# Patient Record
Sex: Male | Born: 1955 | Hispanic: Yes | Smoking: Never smoker
Health system: Southern US, Community
[De-identification: ages and names within clinical notes are randomized; demographics above are authoritative.]

## PROBLEM LIST (undated history)

## (undated) DIAGNOSIS — K5790 Diverticulosis of intestine, part unspecified, without perforation or abscess without bleeding: Secondary | ICD-10-CM

## (undated) DIAGNOSIS — A6000 Herpesviral infection of urogenital system, unspecified: Secondary | ICD-10-CM

## (undated) DIAGNOSIS — B20 Human immunodeficiency virus [HIV] disease: Secondary | ICD-10-CM

## (undated) DIAGNOSIS — K219 Gastro-esophageal reflux disease without esophagitis: Secondary | ICD-10-CM

## (undated) DIAGNOSIS — Z9889 Other specified postprocedural states: Secondary | ICD-10-CM

## (undated) HISTORY — DX: Human immunodeficiency virus (HIV) disease: B20

## (undated) HISTORY — DX: Diverticulosis of intestine, part unspecified, without perforation or abscess without bleeding: K57.90

## (undated) HISTORY — DX: Herpesviral infection of urogenital system, unspecified: A60.00

## (undated) HISTORY — PX: BACK SURGERY: SHX140

## (undated) HISTORY — DX: Gastro-esophageal reflux disease without esophagitis: K21.9

## (undated) HISTORY — DX: Other specified postprocedural states: Z98.890

---

## 2001-05-20 ENCOUNTER — Encounter: Payer: Self-pay | Admitting: Urology

## 2001-05-20 ENCOUNTER — Ambulatory Visit (HOSPITAL_COMMUNITY): Admission: RE | Admit: 2001-05-20 | Discharge: 2001-05-20 | Payer: Self-pay | Admitting: Urology

## 2001-06-05 ENCOUNTER — Other Ambulatory Visit: Admission: RE | Admit: 2001-06-05 | Discharge: 2001-06-05 | Payer: Self-pay | Admitting: Urology

## 2001-07-20 ENCOUNTER — Encounter: Payer: Self-pay | Admitting: *Deleted

## 2001-07-20 ENCOUNTER — Emergency Department (HOSPITAL_COMMUNITY): Admission: EM | Admit: 2001-07-20 | Discharge: 2001-07-20 | Payer: Self-pay | Admitting: *Deleted

## 2002-09-19 ENCOUNTER — Encounter: Payer: Self-pay | Admitting: *Deleted

## 2002-09-19 ENCOUNTER — Emergency Department (HOSPITAL_COMMUNITY): Admission: EM | Admit: 2002-09-19 | Discharge: 2002-09-19 | Payer: Self-pay | Admitting: *Deleted

## 2004-02-13 ENCOUNTER — Emergency Department (HOSPITAL_COMMUNITY): Admission: EM | Admit: 2004-02-13 | Discharge: 2004-02-13 | Payer: Self-pay | Admitting: Emergency Medicine

## 2005-05-10 ENCOUNTER — Ambulatory Visit (HOSPITAL_COMMUNITY): Admission: RE | Admit: 2005-05-10 | Discharge: 2005-05-10 | Payer: Self-pay | Admitting: Family Medicine

## 2006-02-09 ENCOUNTER — Emergency Department (HOSPITAL_COMMUNITY): Admission: EM | Admit: 2006-02-09 | Discharge: 2006-02-09 | Payer: Self-pay | Admitting: Emergency Medicine

## 2006-06-21 ENCOUNTER — Ambulatory Visit: Payer: Self-pay | Admitting: Internal Medicine

## 2006-07-02 ENCOUNTER — Ambulatory Visit (HOSPITAL_COMMUNITY): Admission: RE | Admit: 2006-07-02 | Discharge: 2006-07-02 | Payer: Self-pay | Admitting: Internal Medicine

## 2006-07-02 ENCOUNTER — Ambulatory Visit: Payer: Self-pay | Admitting: Internal Medicine

## 2006-07-02 DIAGNOSIS — Z9889 Other specified postprocedural states: Secondary | ICD-10-CM

## 2006-07-02 HISTORY — DX: Other specified postprocedural states: Z98.890

## 2006-07-03 HISTORY — PX: ESOPHAGOGASTRODUODENOSCOPY: SHX1529

## 2006-07-03 HISTORY — PX: COLONOSCOPY: SHX174

## 2006-07-30 ENCOUNTER — Ambulatory Visit: Payer: Self-pay | Admitting: Internal Medicine

## 2006-08-29 ENCOUNTER — Encounter: Admission: RE | Admit: 2006-08-29 | Discharge: 2006-08-29 | Payer: Self-pay | Admitting: Internal Medicine

## 2006-08-29 ENCOUNTER — Ambulatory Visit: Payer: Self-pay | Admitting: Internal Medicine

## 2006-08-29 ENCOUNTER — Encounter (INDEPENDENT_AMBULATORY_CARE_PROVIDER_SITE_OTHER): Payer: Self-pay | Admitting: *Deleted

## 2006-08-29 LAB — CONVERTED CEMR LAB
CD4 Count: 180 microliters
CD4 T Cell Abs: 180

## 2006-09-18 ENCOUNTER — Ambulatory Visit: Payer: Self-pay | Admitting: Internal Medicine

## 2006-10-21 ENCOUNTER — Encounter (INDEPENDENT_AMBULATORY_CARE_PROVIDER_SITE_OTHER): Payer: Self-pay | Admitting: *Deleted

## 2006-10-21 ENCOUNTER — Encounter: Admission: RE | Admit: 2006-10-21 | Discharge: 2006-10-21 | Payer: Self-pay | Admitting: Internal Medicine

## 2006-10-21 ENCOUNTER — Ambulatory Visit: Payer: Self-pay | Admitting: Internal Medicine

## 2006-10-21 LAB — CONVERTED CEMR LAB
CD4 Count: 350 microliters
HIV 1 RNA Quant: 289 copies/mL

## 2006-11-08 ENCOUNTER — Ambulatory Visit: Payer: Self-pay | Admitting: Internal Medicine

## 2006-11-08 LAB — CONVERTED CEMR LAB
Helicobacter Pylori Antibody-IgG: 0.4
Testosterone: 379.13 ng/dL (ref 350–890)

## 2006-12-03 ENCOUNTER — Ambulatory Visit: Payer: Self-pay | Admitting: Internal Medicine

## 2006-12-30 ENCOUNTER — Encounter (INDEPENDENT_AMBULATORY_CARE_PROVIDER_SITE_OTHER): Payer: Self-pay | Admitting: *Deleted

## 2006-12-30 LAB — CONVERTED CEMR LAB

## 2007-01-12 ENCOUNTER — Encounter (INDEPENDENT_AMBULATORY_CARE_PROVIDER_SITE_OTHER): Payer: Self-pay | Admitting: *Deleted

## 2007-01-16 DIAGNOSIS — J309 Allergic rhinitis, unspecified: Secondary | ICD-10-CM | POA: Insufficient documentation

## 2007-01-16 DIAGNOSIS — F528 Other sexual dysfunction not due to a substance or known physiological condition: Secondary | ICD-10-CM

## 2007-01-16 DIAGNOSIS — B20 Human immunodeficiency virus [HIV] disease: Secondary | ICD-10-CM

## 2007-01-16 DIAGNOSIS — E1165 Type 2 diabetes mellitus with hyperglycemia: Secondary | ICD-10-CM | POA: Insufficient documentation

## 2007-01-16 DIAGNOSIS — E1329 Other specified diabetes mellitus with other diabetic kidney complication: Secondary | ICD-10-CM

## 2007-01-16 DIAGNOSIS — E1365 Other specified diabetes mellitus with hyperglycemia: Secondary | ICD-10-CM

## 2007-01-16 DIAGNOSIS — Z9889 Other specified postprocedural states: Secondary | ICD-10-CM

## 2007-01-22 ENCOUNTER — Encounter: Admission: RE | Admit: 2007-01-22 | Discharge: 2007-01-22 | Payer: Self-pay | Admitting: Internal Medicine

## 2007-01-22 ENCOUNTER — Ambulatory Visit: Payer: Self-pay | Admitting: Internal Medicine

## 2007-01-22 DIAGNOSIS — M279 Disease of jaws, unspecified: Secondary | ICD-10-CM | POA: Insufficient documentation

## 2007-01-22 DIAGNOSIS — K219 Gastro-esophageal reflux disease without esophagitis: Secondary | ICD-10-CM | POA: Insufficient documentation

## 2007-02-26 ENCOUNTER — Telehealth: Payer: Self-pay | Admitting: Internal Medicine

## 2007-03-11 ENCOUNTER — Ambulatory Visit: Payer: Self-pay | Admitting: Internal Medicine

## 2007-03-12 ENCOUNTER — Telehealth: Payer: Self-pay | Admitting: Internal Medicine

## 2007-03-19 ENCOUNTER — Telehealth: Payer: Self-pay | Admitting: Internal Medicine

## 2007-03-25 ENCOUNTER — Telehealth: Payer: Self-pay | Admitting: Internal Medicine

## 2007-04-25 ENCOUNTER — Encounter: Admission: RE | Admit: 2007-04-25 | Discharge: 2007-04-25 | Payer: Self-pay | Admitting: Internal Medicine

## 2007-04-25 ENCOUNTER — Ambulatory Visit: Payer: Self-pay | Admitting: Internal Medicine

## 2007-04-29 ENCOUNTER — Telehealth: Payer: Self-pay | Admitting: Internal Medicine

## 2007-05-21 ENCOUNTER — Ambulatory Visit: Payer: Self-pay | Admitting: Internal Medicine

## 2007-05-27 ENCOUNTER — Telehealth: Payer: Self-pay | Admitting: Internal Medicine

## 2007-06-24 ENCOUNTER — Telehealth: Payer: Self-pay | Admitting: Internal Medicine

## 2007-07-28 ENCOUNTER — Telehealth: Payer: Self-pay | Admitting: Internal Medicine

## 2007-08-11 ENCOUNTER — Ambulatory Visit: Payer: Self-pay | Admitting: Internal Medicine

## 2007-08-11 ENCOUNTER — Encounter: Admission: RE | Admit: 2007-08-11 | Discharge: 2007-08-11 | Payer: Self-pay | Admitting: *Deleted

## 2007-08-11 LAB — CONVERTED CEMR LAB
ALT: 14 units/L (ref 0–53)
Albumin: 4.5 g/dL (ref 3.5–5.2)
BUN: 21 mg/dL (ref 6–23)
Basophils Absolute: 0 10*3/uL (ref 0.0–0.1)
Basophils Relative: 1 % (ref 0–1)
CO2: 26 meq/L (ref 19–32)
HIV 1 RNA Quant: 220 copies/mL — ABNORMAL HIGH (ref <50)
Lymphs Abs: 2.1 10*3/uL (ref 0.7–3.3)
MCV: 84 fL (ref 78.0–100.0)
Monocytes Absolute: 0.3 10*3/uL (ref 0.2–0.7)
Monocytes Relative: 6 % (ref 3–11)
Neutro Abs: 2.9 10*3/uL (ref 1.7–7.7)
Neutrophils Relative %: 50 % (ref 43–77)
RBC: 5.5 M/uL (ref 4.22–5.81)
RDW: 13 % (ref 11.5–14.0)
Total Protein: 7.1 g/dL (ref 6.0–8.3)

## 2007-08-21 ENCOUNTER — Encounter (INDEPENDENT_AMBULATORY_CARE_PROVIDER_SITE_OTHER): Payer: Self-pay | Admitting: *Deleted

## 2007-08-26 ENCOUNTER — Telehealth: Payer: Self-pay | Admitting: Internal Medicine

## 2007-08-27 ENCOUNTER — Encounter (INDEPENDENT_AMBULATORY_CARE_PROVIDER_SITE_OTHER): Payer: Self-pay | Admitting: *Deleted

## 2007-08-27 ENCOUNTER — Ambulatory Visit: Payer: Self-pay | Admitting: Internal Medicine

## 2007-08-28 ENCOUNTER — Encounter: Payer: Self-pay | Admitting: Internal Medicine

## 2007-09-08 ENCOUNTER — Telehealth: Payer: Self-pay | Admitting: Internal Medicine

## 2007-09-09 ENCOUNTER — Telehealth: Payer: Self-pay | Admitting: Internal Medicine

## 2007-09-29 ENCOUNTER — Telehealth: Payer: Self-pay | Admitting: Internal Medicine

## 2007-10-23 ENCOUNTER — Telehealth: Payer: Self-pay | Admitting: Internal Medicine

## 2007-11-24 ENCOUNTER — Encounter: Admission: RE | Admit: 2007-11-24 | Discharge: 2007-11-24 | Payer: Self-pay | Admitting: Internal Medicine

## 2007-11-24 ENCOUNTER — Ambulatory Visit: Payer: Self-pay | Admitting: Internal Medicine

## 2007-11-24 LAB — CONVERTED CEMR LAB
AST: 16 units/L (ref 0–37)
BUN: 16 mg/dL (ref 6–23)
Basophils Absolute: 0 10*3/uL (ref 0.0–0.1)
Calcium: 9.1 mg/dL (ref 8.4–10.5)
Creatinine, Ser: 0.89 mg/dL (ref 0.40–1.50)
Eosinophils Absolute: 0.5 10*3/uL (ref 0.0–0.7)
HCT: 46.9 % (ref 39.0–52.0)
HDL: 37 mg/dL — ABNORMAL LOW (ref >39)
LDL Cholesterol: 126 mg/dL — ABNORMAL HIGH (ref 0–99)
Lymphocytes Relative: 44 % (ref 12–46)
Monocytes Absolute: 0.3 10*3/uL (ref 0.1–1.0)
Monocytes Relative: 5 % (ref 3–12)
Platelets: 263 10*3/uL (ref 150–400)
RBC: 5.66 M/uL (ref 4.22–5.81)
RDW: 13 % (ref 11.5–15.5)
Sodium: 140 meq/L (ref 135–145)
Total Bilirubin: 0.3 mg/dL (ref 0.3–1.2)
Total CHOL/HDL Ratio: 5.1
Triglycerides: 128 mg/dL (ref <150)

## 2007-12-01 ENCOUNTER — Telehealth: Payer: Self-pay | Admitting: Internal Medicine

## 2007-12-10 ENCOUNTER — Encounter (INDEPENDENT_AMBULATORY_CARE_PROVIDER_SITE_OTHER): Payer: Self-pay | Admitting: *Deleted

## 2007-12-10 ENCOUNTER — Ambulatory Visit: Payer: Self-pay | Admitting: Internal Medicine

## 2007-12-26 ENCOUNTER — Encounter (INDEPENDENT_AMBULATORY_CARE_PROVIDER_SITE_OTHER): Payer: Self-pay | Admitting: *Deleted

## 2007-12-29 ENCOUNTER — Telehealth: Payer: Self-pay | Admitting: Internal Medicine

## 2007-12-31 ENCOUNTER — Ambulatory Visit: Payer: Self-pay | Admitting: Internal Medicine

## 2008-01-01 ENCOUNTER — Telehealth (INDEPENDENT_AMBULATORY_CARE_PROVIDER_SITE_OTHER): Payer: Self-pay | Admitting: *Deleted

## 2008-01-27 ENCOUNTER — Telehealth: Payer: Self-pay | Admitting: Internal Medicine

## 2008-03-01 ENCOUNTER — Telehealth (INDEPENDENT_AMBULATORY_CARE_PROVIDER_SITE_OTHER): Payer: Self-pay | Admitting: *Deleted

## 2008-03-10 ENCOUNTER — Encounter: Admission: RE | Admit: 2008-03-10 | Discharge: 2008-03-10 | Payer: Self-pay | Admitting: Internal Medicine

## 2008-03-10 ENCOUNTER — Ambulatory Visit: Payer: Self-pay | Admitting: Internal Medicine

## 2008-03-10 LAB — CONVERTED CEMR LAB
ALT: 14 units/L (ref 0–53)
AST: 15 units/L (ref 0–37)
BUN: 18 mg/dL (ref 6–23)
CO2: 21 meq/L (ref 19–32)
Eosinophils Absolute: 0.5 10*3/uL (ref 0.0–0.7)
Eosinophils Relative: 9 % — ABNORMAL HIGH (ref 0–5)
HCT: 43.9 % (ref 39.0–52.0)
HIV 1 RNA Quant: 168 copies/mL — ABNORMAL HIGH (ref <50)
HIV-1 RNA Quant, Log: 2.23 — ABNORMAL HIGH (ref <1.70)
Hemoglobin: 14.2 g/dL (ref 13.0–17.0)
Lymphocytes Relative: 42 % (ref 12–46)
Lymphs Abs: 2.3 10*3/uL (ref 0.7–4.0)
Neutro Abs: 2.3 10*3/uL (ref 1.7–7.7)
Neutrophils Relative %: 42 % — ABNORMAL LOW (ref 43–77)
Potassium: 4.1 meq/L (ref 3.5–5.3)
RBC: 5.36 M/uL (ref 4.22–5.81)
Sodium: 140 meq/L (ref 135–145)
Total Bilirubin: 0.4 mg/dL (ref 0.3–1.2)
Total Protein: 6.6 g/dL (ref 6.0–8.3)
WBC: 5.6 10*3/uL (ref 4.0–10.5)

## 2008-03-24 ENCOUNTER — Ambulatory Visit: Payer: Self-pay | Admitting: Internal Medicine

## 2008-03-25 ENCOUNTER — Telehealth (INDEPENDENT_AMBULATORY_CARE_PROVIDER_SITE_OTHER): Payer: Self-pay | Admitting: *Deleted

## 2008-04-26 ENCOUNTER — Telehealth (INDEPENDENT_AMBULATORY_CARE_PROVIDER_SITE_OTHER): Payer: Self-pay | Admitting: *Deleted

## 2008-05-24 ENCOUNTER — Telehealth (INDEPENDENT_AMBULATORY_CARE_PROVIDER_SITE_OTHER): Payer: Self-pay | Admitting: *Deleted

## 2008-06-24 ENCOUNTER — Telehealth (INDEPENDENT_AMBULATORY_CARE_PROVIDER_SITE_OTHER): Payer: Self-pay | Admitting: *Deleted

## 2008-06-25 ENCOUNTER — Ambulatory Visit: Payer: Self-pay | Admitting: Internal Medicine

## 2008-06-25 ENCOUNTER — Encounter: Admission: RE | Admit: 2008-06-25 | Discharge: 2008-06-25 | Payer: Self-pay | Admitting: Internal Medicine

## 2008-06-25 LAB — CONVERTED CEMR LAB
Alkaline Phosphatase: 66 units/L (ref 39–117)
BUN: 21 mg/dL (ref 6–23)
Basophils Absolute: 0 10*3/uL (ref 0.0–0.1)
Basophils Relative: 1 % (ref 0–1)
CO2: 23 meq/L (ref 19–32)
Creatinine, Ser: 0.95 mg/dL (ref 0.40–1.50)
Eosinophils Relative: 10 % — ABNORMAL HIGH (ref 0–5)
Glucose, Bld: 105 mg/dL — ABNORMAL HIGH (ref 70–99)
HCT: 44.7 % (ref 39.0–52.0)
HDL: 36 mg/dL — ABNORMAL LOW (ref >39)
HIV 1 RNA Quant: 415 copies/mL — ABNORMAL HIGH (ref <50)
HIV-1 RNA Quant, Log: 2.62 — ABNORMAL HIGH (ref <1.70)
Hemoglobin: 14.5 g/dL (ref 13.0–17.0)
LDL Cholesterol: 80 mg/dL (ref 0–99)
Lymphocytes Relative: 46 % (ref 12–46)
MCV: 81.6 fL (ref 78.0–100.0)
Monocytes Relative: 8 % (ref 3–12)
Neutro Abs: 2 10*3/uL (ref 1.7–7.7)
Neutrophils Relative %: 36 % — ABNORMAL LOW (ref 43–77)
RBC: 5.48 M/uL (ref 4.22–5.81)
RDW: 13 % (ref 11.5–15.5)
Total CHOL/HDL Ratio: 4.3
Total Protein: 7 g/dL (ref 6.0–8.3)
VLDL: 38 mg/dL (ref 0–40)

## 2008-07-07 ENCOUNTER — Ambulatory Visit: Payer: Self-pay | Admitting: Internal Medicine

## 2008-07-26 ENCOUNTER — Telehealth (INDEPENDENT_AMBULATORY_CARE_PROVIDER_SITE_OTHER): Payer: Self-pay | Admitting: *Deleted

## 2008-08-19 ENCOUNTER — Telehealth (INDEPENDENT_AMBULATORY_CARE_PROVIDER_SITE_OTHER): Payer: Self-pay | Admitting: *Deleted

## 2008-08-27 ENCOUNTER — Ambulatory Visit: Payer: Self-pay | Admitting: Internal Medicine

## 2008-08-27 DIAGNOSIS — N509 Disorder of male genital organs, unspecified: Secondary | ICD-10-CM | POA: Insufficient documentation

## 2008-08-27 DIAGNOSIS — R1031 Right lower quadrant pain: Secondary | ICD-10-CM

## 2008-08-27 LAB — CONVERTED CEMR LAB
Nitrite: NEGATIVE
Specific Gravity, Urine: 1.015
Urobilinogen, UA: 0.2
pH: 6.5

## 2008-09-01 ENCOUNTER — Ambulatory Visit (HOSPITAL_COMMUNITY): Admission: RE | Admit: 2008-09-01 | Discharge: 2008-09-01 | Payer: Self-pay | Admitting: Internal Medicine

## 2008-09-21 ENCOUNTER — Telehealth (INDEPENDENT_AMBULATORY_CARE_PROVIDER_SITE_OTHER): Payer: Self-pay | Admitting: *Deleted

## 2008-10-14 ENCOUNTER — Telehealth: Payer: Self-pay | Admitting: Internal Medicine

## 2008-10-14 ENCOUNTER — Ambulatory Visit: Payer: Self-pay | Admitting: Internal Medicine

## 2008-10-14 LAB — CONVERTED CEMR LAB
Albumin: 4.8 g/dL (ref 3.5–5.2)
Alkaline Phosphatase: 76 units/L (ref 39–117)
BUN: 19 mg/dL (ref 6–23)
Basophils Relative: 1 % (ref 0–1)
Chloride: 104 meq/L (ref 96–112)
Cholesterol: 165 mg/dL (ref 0–200)
Eosinophils Relative: 8 % — ABNORMAL HIGH (ref 0–5)
HDL: 34 mg/dL — ABNORMAL LOW (ref >39)
HIV-1 RNA Quant, Log: 1.96 — ABNORMAL HIGH (ref <1.68)
Hemoglobin: 14.7 g/dL (ref 13.0–17.0)
Lymphocytes Relative: 39 % (ref 12–46)
MCHC: 32.2 g/dL (ref 30.0–36.0)
MCV: 83.4 fL (ref 78.0–100.0)
Neutrophils Relative %: 46 % (ref 43–77)
RDW: 12.7 % (ref 11.5–15.5)
Total Bilirubin: 0.5 mg/dL (ref 0.3–1.2)
Total CHOL/HDL Ratio: 4.9
Triglycerides: 149 mg/dL (ref <150)
WBC: 5.4 10*3/uL (ref 4.0–10.5)

## 2008-10-15 ENCOUNTER — Ambulatory Visit: Payer: Self-pay | Admitting: Internal Medicine

## 2008-10-18 ENCOUNTER — Telehealth (INDEPENDENT_AMBULATORY_CARE_PROVIDER_SITE_OTHER): Payer: Self-pay | Admitting: *Deleted

## 2008-10-22 ENCOUNTER — Telehealth (INDEPENDENT_AMBULATORY_CARE_PROVIDER_SITE_OTHER): Payer: Self-pay | Admitting: *Deleted

## 2008-11-10 ENCOUNTER — Encounter (INDEPENDENT_AMBULATORY_CARE_PROVIDER_SITE_OTHER): Payer: Self-pay | Admitting: *Deleted

## 2008-11-10 ENCOUNTER — Ambulatory Visit: Payer: Self-pay | Admitting: Internal Medicine

## 2008-12-06 ENCOUNTER — Telehealth (INDEPENDENT_AMBULATORY_CARE_PROVIDER_SITE_OTHER): Payer: Self-pay | Admitting: *Deleted

## 2008-12-15 ENCOUNTER — Encounter: Payer: Self-pay | Admitting: Internal Medicine

## 2008-12-20 ENCOUNTER — Encounter (INDEPENDENT_AMBULATORY_CARE_PROVIDER_SITE_OTHER): Payer: Self-pay | Admitting: *Deleted

## 2009-02-10 ENCOUNTER — Ambulatory Visit: Payer: Self-pay | Admitting: Internal Medicine

## 2009-02-10 LAB — CONVERTED CEMR LAB
Albumin: 4.7 g/dL (ref 3.5–5.2)
BUN: 15 mg/dL (ref 6–23)
Calcium: 9.7 mg/dL (ref 8.4–10.5)
Chloride: 105 meq/L (ref 96–112)
Creatinine, Ser: 0.89 mg/dL (ref 0.40–1.50)
Eosinophils Absolute: 0.4 10*3/uL (ref 0.0–0.7)
Eosinophils Relative: 8 % — ABNORMAL HIGH (ref 0–5)
GFR calc Af Amer: >60 mL/min (ref >60)
Glucose, Bld: 130 mg/dL — ABNORMAL HIGH (ref 70–99)
HCT: 44.7 % (ref 39.0–52.0)
HIV-1 RNA Quant, Log: 2.1 — ABNORMAL HIGH (ref <1.68)
Lymphocytes Relative: 40 % (ref 12–46)
Monocytes Absolute: 0.4 10*3/uL (ref 0.1–1.0)
Monocytes Relative: 7 % (ref 3–12)
Neutro Abs: 2.5 10*3/uL (ref 1.7–7.7)
Potassium: 3.7 meq/L (ref 3.5–5.3)
RDW: 12.7 % (ref 11.5–15.5)
Sodium: 141 meq/L (ref 135–145)
Total Protein: 7.2 g/dL (ref 6.0–8.3)

## 2009-02-18 ENCOUNTER — Telehealth (INDEPENDENT_AMBULATORY_CARE_PROVIDER_SITE_OTHER): Payer: Self-pay | Admitting: *Deleted

## 2009-02-25 ENCOUNTER — Ambulatory Visit: Payer: Self-pay | Admitting: Internal Medicine

## 2009-03-17 ENCOUNTER — Telehealth (INDEPENDENT_AMBULATORY_CARE_PROVIDER_SITE_OTHER): Payer: Self-pay | Admitting: *Deleted

## 2009-03-30 ENCOUNTER — Encounter: Admission: RE | Admit: 2009-03-30 | Discharge: 2009-03-30 | Payer: Self-pay | Admitting: Internal Medicine

## 2009-03-30 ENCOUNTER — Ambulatory Visit: Payer: Self-pay | Admitting: Internal Medicine

## 2009-04-15 ENCOUNTER — Telehealth (INDEPENDENT_AMBULATORY_CARE_PROVIDER_SITE_OTHER): Payer: Self-pay | Admitting: *Deleted

## 2009-05-16 ENCOUNTER — Telehealth (INDEPENDENT_AMBULATORY_CARE_PROVIDER_SITE_OTHER): Payer: Self-pay | Admitting: *Deleted

## 2009-05-26 ENCOUNTER — Ambulatory Visit: Payer: Self-pay | Admitting: Internal Medicine

## 2009-05-26 LAB — CONVERTED CEMR LAB
ALT: 47 units/L (ref 0–53)
Basophils Absolute: 0 10*3/uL (ref 0.0–0.1)
CO2: 27 meq/L (ref 19–32)
Calcium: 9.5 mg/dL (ref 8.4–10.5)
Chloride: 104 meq/L (ref 96–112)
Creatinine, Ser: 0.92 mg/dL (ref 0.40–1.50)
HCT: 44 % (ref 39.0–52.0)
HIV-1 RNA Quant, Log: 2.4 — ABNORMAL HIGH (ref <1.68)
Lymphs Abs: 2.1 10*3/uL (ref 0.7–4.0)
MCHC: 32.5 g/dL (ref 30.0–36.0)
MCV: 83.2 fL (ref 78.0–100.0)
Monocytes Absolute: 0.6 10*3/uL (ref 0.1–1.0)
Monocytes Relative: 10 % (ref 3–12)
Neutro Abs: 2.8 10*3/uL (ref 1.7–7.7)
Neutrophils Relative %: 47 % (ref 43–77)
Platelets: 269 10*3/uL (ref 150–400)
Total Bilirubin: 0.4 mg/dL (ref 0.3–1.2)
Total Protein: 6.8 g/dL (ref 6.0–8.3)

## 2009-06-10 ENCOUNTER — Ambulatory Visit: Payer: Self-pay | Admitting: Internal Medicine

## 2009-06-10 DIAGNOSIS — L0293 Carbuncle, unspecified: Secondary | ICD-10-CM

## 2009-06-10 DIAGNOSIS — L0292 Furuncle, unspecified: Secondary | ICD-10-CM | POA: Insufficient documentation

## 2009-06-23 ENCOUNTER — Telehealth (INDEPENDENT_AMBULATORY_CARE_PROVIDER_SITE_OTHER): Payer: Self-pay | Admitting: *Deleted

## 2009-07-26 ENCOUNTER — Telehealth (INDEPENDENT_AMBULATORY_CARE_PROVIDER_SITE_OTHER): Payer: Self-pay | Admitting: *Deleted

## 2009-08-22 ENCOUNTER — Telehealth (INDEPENDENT_AMBULATORY_CARE_PROVIDER_SITE_OTHER): Payer: Self-pay | Admitting: *Deleted

## 2009-08-24 ENCOUNTER — Ambulatory Visit: Payer: Self-pay | Admitting: Internal Medicine

## 2009-08-24 LAB — CONVERTED CEMR LAB
ALT: 22 units/L (ref 0–53)
Alkaline Phosphatase: 68 units/L (ref 39–117)
BUN: 18 mg/dL (ref 6–23)
Basophils Absolute: 0 10*3/uL (ref 0.0–0.1)
CO2: 21 meq/L (ref 19–32)
Chloride: 105 meq/L (ref 96–112)
Creatinine, Ser: 0.9 mg/dL (ref 0.40–1.50)
Eosinophils Absolute: 0.5 10*3/uL (ref 0.0–0.7)
Eosinophils Relative: 9 % — ABNORMAL HIGH (ref 0–5)
HCT: 44.3 % (ref 39.0–52.0)
HIV 1 RNA Quant: 61 copies/mL — ABNORMAL HIGH (ref <48)
HIV-1 RNA Quant, Log: 1.79 — ABNORMAL HIGH (ref <1.68)
Lymphocytes Relative: 42 % (ref 12–46)
MCV: 84.4 fL (ref >=78.0)
Monocytes Absolute: 0.5 10*3/uL (ref 0.1–1.0)
Neutro Abs: 2.5 10*3/uL (ref 1.7–7.7)
Potassium: 4.1 meq/L (ref 3.5–5.3)
RDW: 12.9 % (ref 11.5–15.5)
Sodium: 141 meq/L (ref 135–145)
Total Bilirubin: 0.2 mg/dL — ABNORMAL LOW (ref 0.3–1.2)
Total Protein: 7 g/dL (ref 6.0–8.3)

## 2009-09-07 ENCOUNTER — Ambulatory Visit: Payer: Self-pay | Admitting: Internal Medicine

## 2009-09-20 ENCOUNTER — Telehealth (INDEPENDENT_AMBULATORY_CARE_PROVIDER_SITE_OTHER): Payer: Self-pay | Admitting: *Deleted

## 2009-09-22 ENCOUNTER — Encounter (INDEPENDENT_AMBULATORY_CARE_PROVIDER_SITE_OTHER): Payer: Self-pay | Admitting: *Deleted

## 2009-10-18 ENCOUNTER — Telehealth (INDEPENDENT_AMBULATORY_CARE_PROVIDER_SITE_OTHER): Payer: Self-pay | Admitting: *Deleted

## 2009-11-16 ENCOUNTER — Telehealth (INDEPENDENT_AMBULATORY_CARE_PROVIDER_SITE_OTHER): Payer: Self-pay | Admitting: *Deleted

## 2009-12-06 ENCOUNTER — Ambulatory Visit: Payer: Self-pay | Admitting: Internal Medicine

## 2009-12-06 LAB — CONVERTED CEMR LAB
ALT: 19 units/L (ref 0–53)
Albumin: 4.4 g/dL (ref 3.5–5.2)
Alkaline Phosphatase: 71 units/L (ref 39–117)
Basophils Relative: 1 % (ref 0–1)
CO2: 29 meq/L (ref 19–32)
Chloride: 103 meq/L (ref 96–112)
Creatinine, Ser: 0.9 mg/dL (ref 0.40–1.50)
Eosinophils Relative: 8 % — ABNORMAL HIGH (ref 0–5)
Glucose, Bld: 117 mg/dL — ABNORMAL HIGH (ref 70–99)
HCT: 43 % (ref 39.0–52.0)
HIV 1 RNA Quant: 135 copies/mL — ABNORMAL HIGH (ref <48)
MCHC: 32.6 g/dL (ref 30.0–36.0)
MCV: 82.7 fL (ref >=78.0)
Monocytes Relative: 6 % (ref 3–12)
Neutrophils Relative %: 47 % (ref 43–77)
Sodium: 140 meq/L (ref 135–145)
Total Bilirubin: 0.3 mg/dL (ref 0.3–1.2)

## 2009-12-17 ENCOUNTER — Telehealth (INDEPENDENT_AMBULATORY_CARE_PROVIDER_SITE_OTHER): Payer: Self-pay | Admitting: *Deleted

## 2009-12-21 ENCOUNTER — Ambulatory Visit: Payer: Self-pay | Admitting: Internal Medicine

## 2009-12-21 LAB — CONVERTED CEMR LAB: Helicobacter Pylori Antibody-IgG: <0.4

## 2009-12-26 ENCOUNTER — Ambulatory Visit: Payer: Self-pay | Admitting: Gastroenterology

## 2009-12-29 ENCOUNTER — Encounter (INDEPENDENT_AMBULATORY_CARE_PROVIDER_SITE_OTHER): Payer: Self-pay | Admitting: *Deleted

## 2010-01-10 ENCOUNTER — Telehealth (INDEPENDENT_AMBULATORY_CARE_PROVIDER_SITE_OTHER): Payer: Self-pay | Admitting: *Deleted

## 2010-02-09 ENCOUNTER — Telehealth (INDEPENDENT_AMBULATORY_CARE_PROVIDER_SITE_OTHER): Payer: Self-pay | Admitting: *Deleted

## 2010-03-22 ENCOUNTER — Ambulatory Visit: Payer: Self-pay | Admitting: Internal Medicine

## 2010-03-22 LAB — CONVERTED CEMR LAB
AST: 17 units/L (ref 0–37)
Albumin: 4.6 g/dL (ref 3.5–5.2)
Alkaline Phosphatase: 64 units/L (ref 39–117)
BUN: 17 mg/dL (ref 6–23)
Basophils Absolute: 0 10*3/uL (ref 0.0–0.1)
CO2: 29 meq/L (ref 19–32)
Chloride: 103 meq/L (ref 96–112)
Glucose, Bld: 121 mg/dL — ABNORMAL HIGH (ref 70–99)
HCT: 45.1 % (ref 39.0–52.0)
HIV-1 RNA Quant, Log: <1.68 (ref <1.68)
Hemoglobin: 14.4 g/dL (ref 13.0–17.0)
MCV: 82.8 fL (ref 78.0–100.0)
Neutrophils Relative %: 47 % (ref 43–77)
Platelets: 260 10*3/uL (ref 150–400)
Potassium: 3.8 meq/L (ref 3.5–5.3)
RBC: 5.45 M/uL (ref 4.22–5.81)
RDW: 13.2 % (ref 11.5–15.5)

## 2010-04-05 ENCOUNTER — Ambulatory Visit: Payer: Self-pay | Admitting: Internal Medicine

## 2010-04-18 ENCOUNTER — Ambulatory Visit: Payer: Self-pay | Admitting: Internal Medicine

## 2010-04-18 DIAGNOSIS — T2200XA Burn of unspecified degree of shoulder and upper limb, except wrist and hand, unspecified site, initial encounter: Secondary | ICD-10-CM | POA: Insufficient documentation

## 2010-04-24 ENCOUNTER — Telehealth: Payer: Self-pay | Admitting: Internal Medicine

## 2010-05-24 ENCOUNTER — Telehealth: Payer: Self-pay | Admitting: Internal Medicine

## 2010-06-09 ENCOUNTER — Telehealth: Payer: Self-pay | Admitting: Internal Medicine

## 2010-07-04 ENCOUNTER — Ambulatory Visit: Payer: Self-pay | Admitting: Internal Medicine

## 2010-07-04 LAB — CONVERTED CEMR LAB
ALT: 15 units/L (ref 0–53)
AST: 14 units/L (ref 0–37)
Alkaline Phosphatase: 62 units/L (ref 39–117)
Basophils Absolute: 0 10*3/uL (ref 0.0–0.1)
Basophils Relative: 0 % (ref 0–1)
CO2: 27 meq/L (ref 19–32)
Chloride: 103 meq/L (ref 96–112)
Creatinine, Ser: 0.83 mg/dL (ref 0.40–1.50)
Eosinophils Absolute: 0.4 10*3/uL (ref 0.0–0.7)
Glucose, Bld: 106 mg/dL — ABNORMAL HIGH (ref 70–99)
HCT: 43.7 % (ref 39.0–52.0)
HDL: 34 mg/dL — ABNORMAL LOW (ref >39)
HIV-1 RNA Quant, Log: <1.68 (ref <1.68)
LDL Cholesterol: 92 mg/dL (ref 0–99)
Lymphs Abs: 2 10*3/uL (ref 0.7–4.0)
MCHC: 32.3 g/dL (ref 30.0–36.0)
Monocytes Relative: 8 % (ref 3–12)
Potassium: 3.9 meq/L (ref 3.5–5.3)
Total Bilirubin: 0.3 mg/dL (ref 0.3–1.2)
Total CHOL/HDL Ratio: 4.7

## 2010-07-05 ENCOUNTER — Telehealth: Payer: Self-pay | Admitting: Internal Medicine

## 2010-07-19 ENCOUNTER — Ambulatory Visit: Payer: Self-pay | Admitting: Internal Medicine

## 2010-07-25 ENCOUNTER — Ambulatory Visit: Payer: Self-pay | Admitting: Internal Medicine

## 2010-07-25 IMAGING — CT CT ABDOMEN W/O CM
3 of 4 series · 13 of 36 positions shown, 19 images · non-contrast
Comparison: None

CT ABDOMEN

CLINICAL DATA: Right lower abdominal pain.  Query kidney stones.

CT OF THE ABDOMEN AND PELVIS WITHOUT CONTRAST (CT UROGRAM)
TECHNIQUE: Multidetector CT imaging was performed through the
abdomen and pelvis to include the urinary tract.

[Series 3: renal stone · axial · 0.68mm/px · z∈[-364,-84]mm · 8 of 74 slices shown, 13 images]
[im 9/74  soft-tissue]
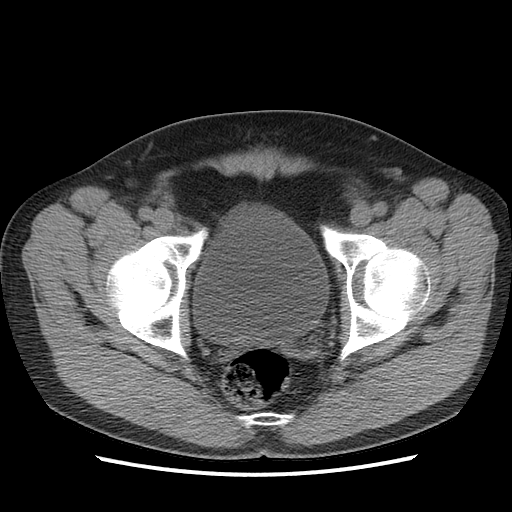
[im 9/74  bone]
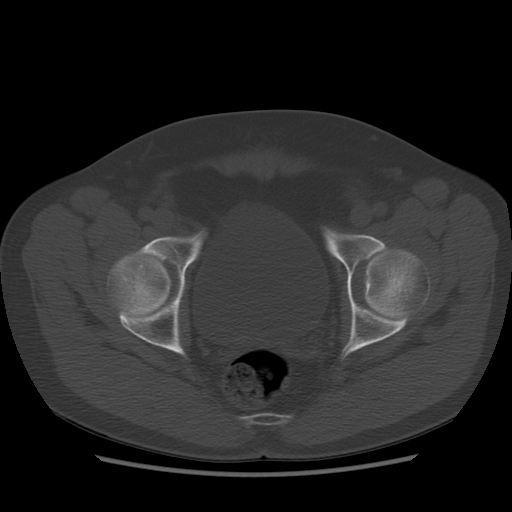
[im 17/74  soft-tissue]
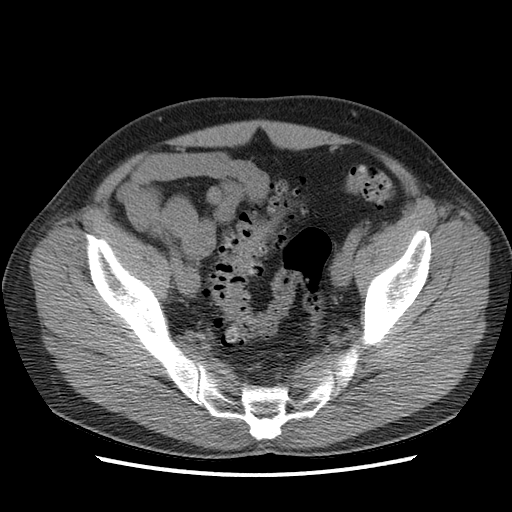
[im 25/74  soft-tissue]
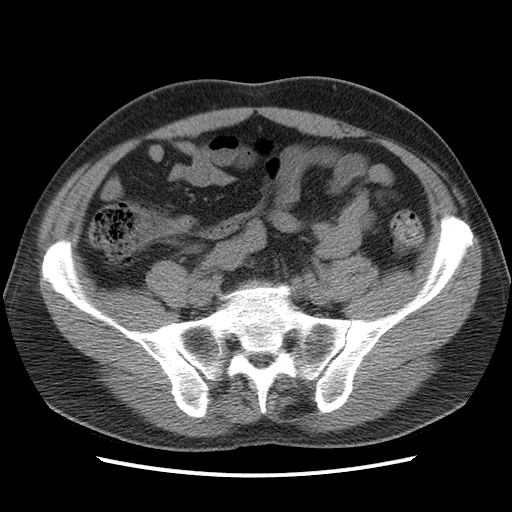
[im 33/74  soft-tissue]
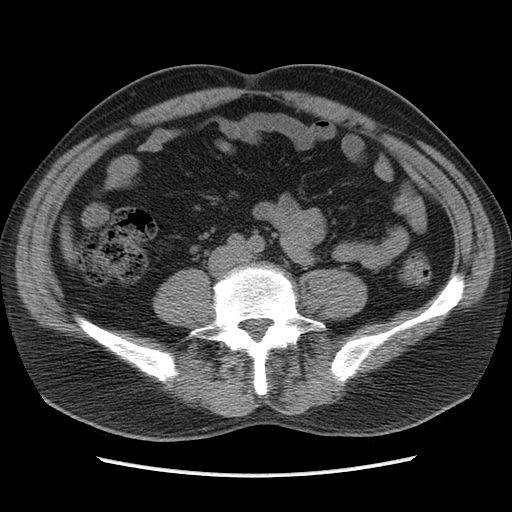
[im 41/74  soft-tissue]
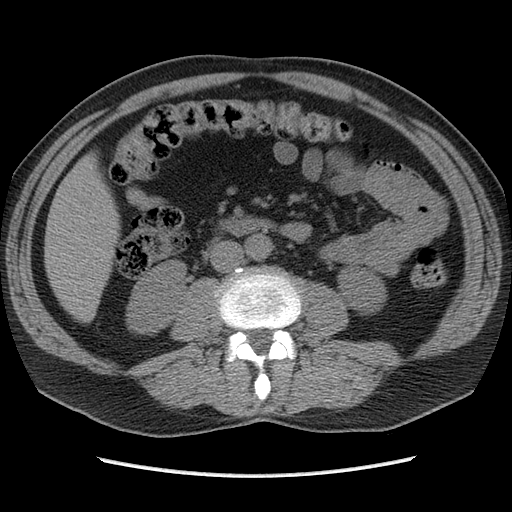
[im 41/74  lung]
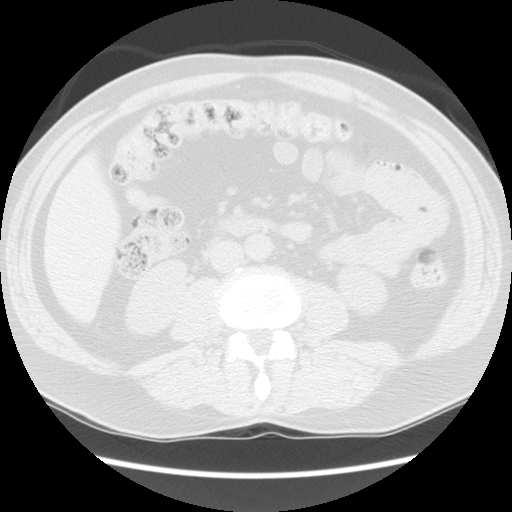
[im 49/74  soft-tissue]
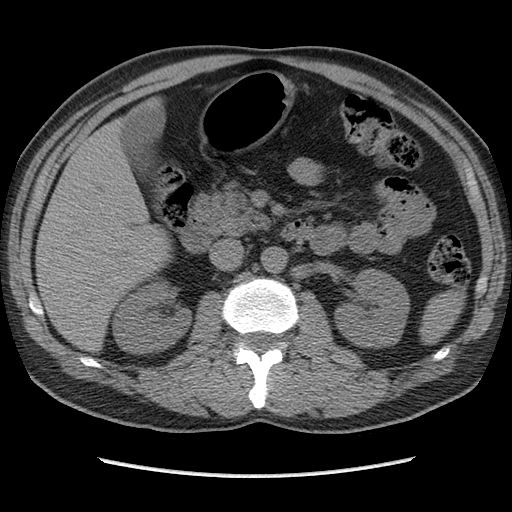
[im 49/74  lung]
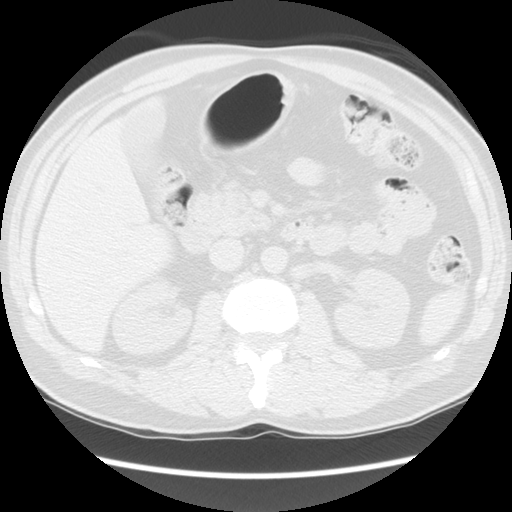
[im 57/74  soft-tissue]
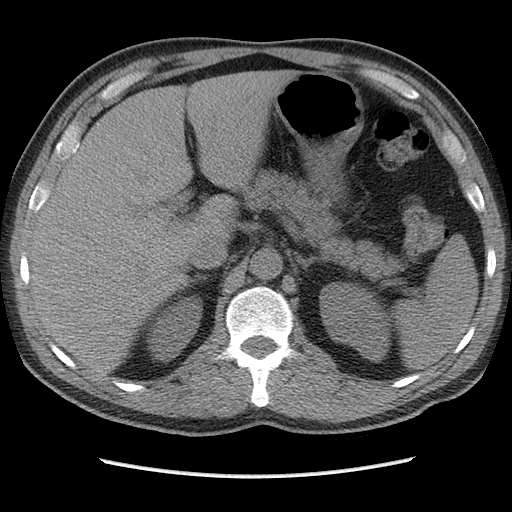
[im 57/74  lung]
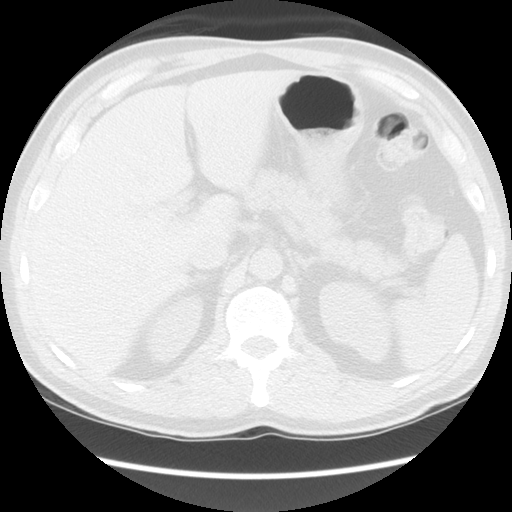
[im 65/74  soft-tissue]
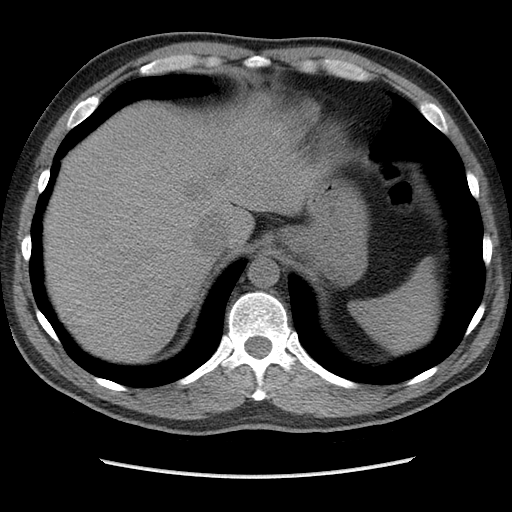
[im 65/74  lung]
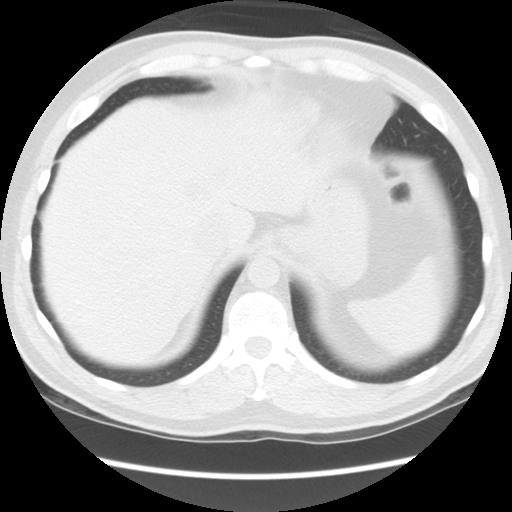

[Series 601: coronal body · coronal · 0.80mm/px · 1 of 122 slices shown, 2 images]
[im 41/122  soft-tissue]
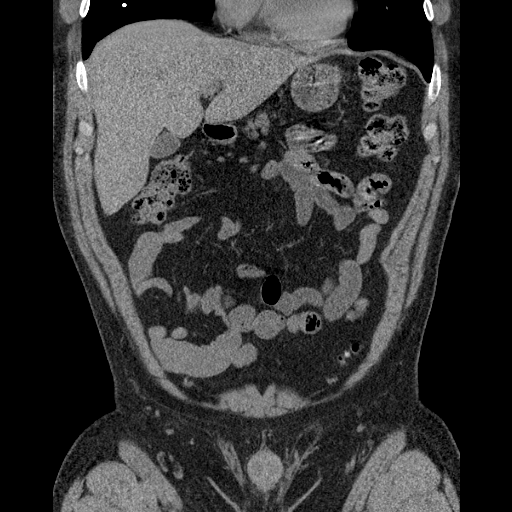
[im 41/122  bone]
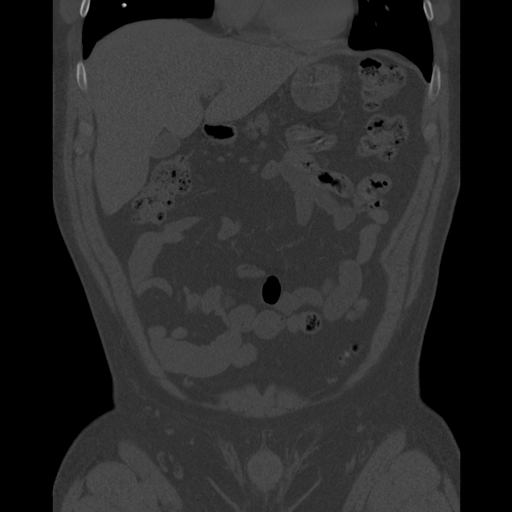

[Series 602: sagittal body · sagittal · 0.80mm/px · 4 of 141 slices shown]
[im 15/141  soft-tissue]
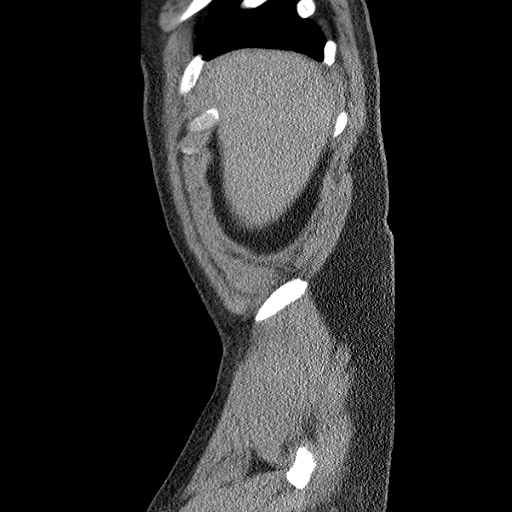
[im 30/141  soft-tissue]
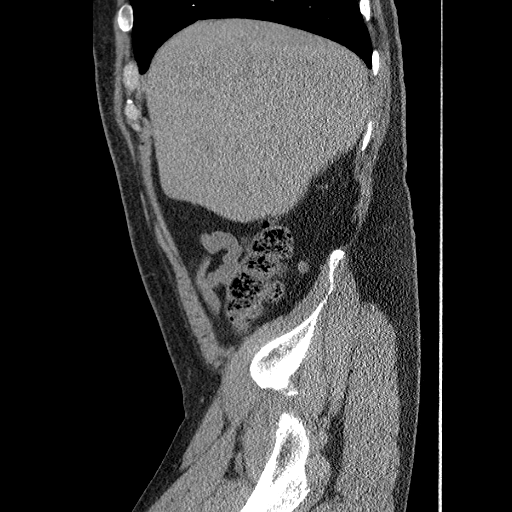
[im 45/141  soft-tissue]
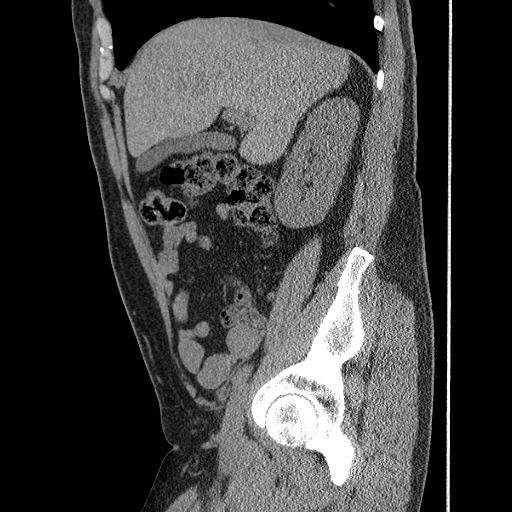
[im 59/141  soft-tissue]
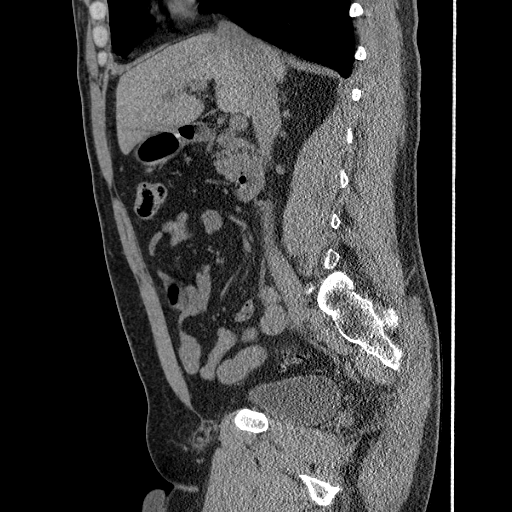

[13 of 36 positions shown; findings below may reference images not displayed]

FINDINGS: Normal liver, spleen, kidneys, adrenal glands, and
pancreas.  No urinary tract calculi or hydronephrosis. The appendix
is somewhat prominent in caliber however there are no secondary
findings such as edema to strongly suggest the presence of acute
appendicitis.
IMPRESSION: No definite acute abdominal findings.  Prominent caliber of the
appendix otherwise the appendix appears unremarkable.  I cannot
make a definite diagnosis of appendicitis.

CT PELVIS
FINDINGS: Multiple sigmoid colon diverticula.  Negative for
diverticulitis.  No free pelvic fluid.  Bladder unremarkable.
IMPRESSION: No acute pelvic findings.

## 2010-07-31 ENCOUNTER — Telehealth: Payer: Self-pay | Admitting: Internal Medicine

## 2010-08-14 ENCOUNTER — Ambulatory Visit: Payer: Self-pay | Admitting: Internal Medicine

## 2010-08-14 ENCOUNTER — Emergency Department (HOSPITAL_COMMUNITY): Admission: EM | Admit: 2010-08-14 | Discharge: 2010-08-14 | Payer: Self-pay | Admitting: Emergency Medicine

## 2010-08-14 DIAGNOSIS — R0789 Other chest pain: Secondary | ICD-10-CM

## 2010-09-08 ENCOUNTER — Telehealth: Payer: Self-pay

## 2010-10-05 ENCOUNTER — Ambulatory Visit: Payer: Self-pay | Admitting: Internal Medicine

## 2010-10-05 LAB — CONVERTED CEMR LAB
ALT: 58 units/L — ABNORMAL HIGH (ref 0–53)
AST: 35 units/L (ref 0–37)
Albumin: 4.5 g/dL (ref 3.5–5.2)
BUN: 17 mg/dL (ref 6–23)
Basophils Absolute: 0 10*3/uL (ref 0.0–0.1)
Basophils Relative: 1 % (ref 0–1)
Calcium: 9.2 mg/dL (ref 8.4–10.5)
Creatinine, Ser: 0.81 mg/dL (ref 0.40–1.50)
HIV 1 RNA Quant: 23 copies/mL — ABNORMAL HIGH (ref <20)
HIV-1 RNA Quant, Log: 1.36 — ABNORMAL HIGH (ref <1.30)
Lymphs Abs: 1.6 10*3/uL (ref 0.7–4.0)
MCHC: 32.1 g/dL (ref 30.0–36.0)
MCV: 85.4 fL (ref 78.0–100.0)
Monocytes Absolute: 0.4 10*3/uL (ref 0.1–1.0)
RDW: 12.9 % (ref 11.5–15.5)
Total Bilirubin: 0.3 mg/dL (ref 0.3–1.2)
Total Protein: 6.7 g/dL (ref 6.0–8.3)

## 2010-10-06 ENCOUNTER — Telehealth (INDEPENDENT_AMBULATORY_CARE_PROVIDER_SITE_OTHER): Payer: Self-pay | Admitting: *Deleted

## 2010-11-08 ENCOUNTER — Telehealth (INDEPENDENT_AMBULATORY_CARE_PROVIDER_SITE_OTHER): Payer: Self-pay | Admitting: *Deleted

## 2010-11-13 ENCOUNTER — Ambulatory Visit
Admission: RE | Admit: 2010-11-13 | Discharge: 2010-11-13 | Payer: Self-pay | Source: Home / Self Care | Attending: Internal Medicine | Admitting: Internal Medicine

## 2010-11-13 DIAGNOSIS — D179 Benign lipomatous neoplasm, unspecified: Secondary | ICD-10-CM | POA: Insufficient documentation

## 2010-11-20 ENCOUNTER — Encounter (INDEPENDENT_AMBULATORY_CARE_PROVIDER_SITE_OTHER): Payer: Self-pay | Admitting: *Deleted

## 2010-11-21 ENCOUNTER — Encounter (INDEPENDENT_AMBULATORY_CARE_PROVIDER_SITE_OTHER): Payer: Self-pay | Admitting: *Deleted

## 2010-12-03 LAB — CONVERTED CEMR LAB
ALT: 16 units/L (ref 0–53)
ALT: 17 units/L (ref 0–53)
AST: 18 units/L (ref 0–37)
Albumin: 4.7 g/dL (ref 3.5–5.2)
BUN: 22 mg/dL (ref 6–23)
Basophils Absolute: 0 10*3/uL (ref 0.0–0.1)
Basophils Absolute: 0 10*3/uL (ref 0.0–0.1)
Basophils Relative: 1 % (ref 0–1)
CD4 Count: 410 microliters
CO2: 22 meq/L (ref 19–32)
Creatinine, Ser: 0.92 mg/dL (ref 0.40–1.50)
Creatinine, Ser: 1.03 mg/dL (ref 0.40–1.50)
Eosinophils Absolute: 0.3 10*3/uL (ref 0.0–0.7)
Eosinophils Relative: 6 % — ABNORMAL HIGH (ref 0–5)
HIV 1 RNA Quant: 66 copies/mL — ABNORMAL HIGH (ref <50)
HIV-1 RNA Quant, Log: 1.82 — ABNORMAL HIGH (ref <1.70)
HIV-1 RNA Quant, Log: 1.93 — ABNORMAL HIGH (ref <1.70)
Lymphocytes Relative: 35 % (ref 12–46)
Lymphocytes Relative: 40 % (ref 12–46)
MCHC: 32.5 g/dL (ref 30.0–36.0)
MCHC: 33.2 g/dL (ref 30.0–36.0)
MCV: 82.8 fL (ref 78.0–100.0)
MCV: 82.9 fL (ref 78.0–100.0)
Monocytes Absolute: 0.4 10*3/uL (ref 0.2–0.7)
Neutro Abs: 2.2 10*3/uL (ref 1.7–7.7)
Neutro Abs: 2.3 10*3/uL (ref 1.7–7.7)
Neutrophils Relative %: 54 % (ref 43–77)
Platelets: 239 10*3/uL (ref 150–400)
Platelets: 258 10*3/uL (ref 150–400)
Potassium: 4.1 meq/L (ref 3.5–5.3)
RBC: 5.28 M/uL (ref 4.22–5.81)
RBC: 5.49 M/uL (ref 4.22–5.81)
RDW: 12.9 % (ref 11.5–14.0)
Total Bilirubin: 0.3 mg/dL (ref 0.3–1.2)
WBC: 4.4 10*3/uL (ref 4.0–10.5)
WBC: 4.9 10*3/uL (ref 4.0–10.5)

## 2010-12-07 NOTE — Progress Notes (Signed)
Summary: [facility]/pt assist med arrived for Apr  Phone Note Refill Request      Prescriptions: ATRIPLA 600-200-300 MG TABS (EFAVIRENZ-EMTRICITAB-TENOFOVIR) Take 1 tablet by mouth at bedtime  #30 x 0   Entered by:   Paulo Fruit  BS,CPht II,MPH   Authorized by:   Yisroel Ramming MD   Signed by:   Paulo Fruit  BS,CPht II,MPH on 02/09/2010   Method used:   Samples Given   RxID:   [id number]  **this is last time patient can get medication via ADAP. Patient was denied.**   Patient Assist Medication Verification: Medication: Atripla Lot# [id number] Exp Date:07 2013 Tech approval:MLD Call placed to patient with message that assistance medications are ready for pick-up. Paulo Fruit  BS,CPht II,MPH  February 09, 2010 4:32 PM

## 2010-12-07 NOTE — Assessment & Plan Note (Signed)
Summary: 88month f/u/vs   Primary Provider:  None  CC:  follow-up visit, lab results, and pt. will be running out of meds soon needs to see Byrd Hesselbach.  History of Present Illness: (Interpreter utilized) Pt is concerned about insurance coverage for his Atripla.  He apparently has a 5,000 dollar maximun coverage for Rx.  He does not want to run out of his meds. I will have him meet with Byrd Hesselbach to make sure he gets enrolled in ADAP after he reaches his max. No new problems. No missed doses of his Atripla.  Preventive Screening-Counseling & Management  Alcohol-Tobacco     Alcohol drinks/day: 0     Smoking Status: never  Caffeine-Diet-Exercise     Caffeine use/day: 0     Does Patient Exercise: yes     Type of exercise: walking     Exercise (avg: min/session): 30-60     Times/week: 7  Safety-Violence-Falls     Seat Belt Use: yes      Sexual History:  n/a.     Updated Prior Medication List: ATRIPLA 600-200-300 MG TABS (EFAVIRENZ-EMTRICITAB-TENOFOVIR) Take 1 tablet by mouth at bedtime PEPCID 20 MG TABS (FAMOTIDINE) Take 1 tablet by mouth two times a day  Current Allergies (reviewed today): No known allergies  Past History:  Past Medical History: Last updated: 01/22/2007 GERD HIV disease  Social History: Sexual History:  n/a  Review of Systems  The patient denies anorexia, fever, and weight loss.    Vital Signs:  Patient profile:   55 year old male Height:      66 inches (167.64 cm) Weight:      173.8 pounds (79 kg) BMI:     28.15 Temp:     97.7 degrees F (36.50 degrees C) oral Pulse rate:   71 / minute BP sitting:   115 / 72  (left arm)  Vitals Entered By: Wendall Mola CMA Duncan Dull) (April 05, 2010 9:53 AM) CC: follow-up visit, lab results, pt. will be running out of meds soon needs to see Byrd Hesselbach Is Patient Diabetic? No Pain Assessment Patient in pain? no      Nutritional Status BMI of 25 - 29 = overweight Nutritional Status Detail appetite "ok"  Does  patient need assistance? Functional Status Self care Ambulation Normal Comments no missed doses of meds per patient   Physical Exam  General:  alert, well-developed, well-nourished, and well-hydrated.   Head:  normocephalic and atraumatic.   Lungs:  normal breath sounds.      Impression & Recommendations:  Problem # 1:  HIV DISEASE (ICD-042) Pt.s most recent CD4ct was 550 and VL <48 .  Pt instructed to continue the current antiretroviral regimen.  Pt encouraged to take medication regularly and not miss doses.  Pt will f/u in 3 months for repeat blood work and will see me 2 weeks later.  Diagnostics Reviewed:  HIV: CDC-defined AIDS (12/21/2009)   CD4: 550 (03/23/2010)   WBC: 5.9 (03/22/2010)   Hgb: 14.4 (03/22/2010)   HCT: 45.1 (03/22/2010)   Platelets: 260 (03/22/2010) HIV-1 RNA: <48 copies/mL (03/22/2010)   HBSAg: NO (12/30/2006)  Other Orders: Est. Patient Level III (29562) Future Orders: T-CD4SP ([facility]) (CD4SP) ... 07/04/2010 T-HIV Viral Load [phone]) ... 07/04/2010 T-Comprehensive Metabolic Panel [id number]) ... 07/04/2010 T-CBC w/Diff ([id number]) ... 07/04/2010 T-Lipid Profile [phone]) ... 07/04/2010  Patient Instructions: 1)  Please schedule a follow-up appointment in 3 months,  2 weeks after labs.

## 2010-12-07 NOTE — Progress Notes (Signed)
Summary: sample given  Phone Note Call from Patient   Caller: Patient walk in Summary of Call: Patient walked in today needing a sample of Atripla because his insurance will no longer cover medication.  He did complete an ADAP application early in the year in which he was denied because of the insurance coverage.  Will resubmit another ADAP application since he has no insurance at all that covers medications.  A sample of Atripla was given to patient today. Initial call taken by: Paulo Fruit  BS,CPht II,MPH,  April 24, 2010 10:17 AM    Prescriptions: ATRIPLA 600-200-300 MG TABS (EFAVIRENZ-EMTRICITAB-TENOFOVIR) Take 1 tablet by mouth at bedtime  #30 x 0   Entered by:   Paulo Fruit  BS,CPht II,MPH   Authorized by:   Yisroel Ramming MD   Signed by:   Paulo Fruit  BS,CPht II,MPH on 04/24/2010   Method used:   Samples Given   RxID:   [id number]  Sample Given, Lot #: [name] Expiration Date:06 2012 Patient has been instructed regarding the correct time, dose and frequency of taking this med, including desired effects and most common side effects. Paulo Fruit  BS,CPht II,MPH  April 24, 2010 10:18 AM

## 2010-12-07 NOTE — Assessment & Plan Note (Signed)
Summary: per tammy burn on arm [mkj]   Primary Provider:  None  CC:  pt. c/o burn on left lower arm 8 days ago with hot wire/saw family Dr. yesterday and given silver sulfadiazine cream.  History of Present Illness: Pt burned his arm on a hot wire 8 days ago he saw his PCP who gave him silvadine cream.  he has some questions about covering it.  Preventive Screening-Counseling & Management  Alcohol-Tobacco     Alcohol drinks/day: 0     Smoking Status: never  Caffeine-Diet-Exercise     Caffeine use/day: 0     Does Patient Exercise: yes     Type of exercise: walking     Exercise (avg: min/session): 30-60     Times/week: 7  Safety-Violence-Falls     Seat Belt Use: yes      Sexual History:  n/a.     Updated Prior Medication List: ATRIPLA 600-200-300 MG TABS (EFAVIRENZ-EMTRICITAB-TENOFOVIR) Take 1 tablet by mouth at bedtime PEPCID 20 MG TABS (FAMOTIDINE) Take 1 tablet by mouth two times a day  Current Allergies (reviewed today): No known allergies  Past History:  Past Medical History: Last updated: 01/22/2007 GERD HIV disease  Vital Signs:  Patient profile:   55 year old male Height:      66 inches (167.64 cm) Weight:      175.8 pounds (79.91 kg) BMI:     28.48 Temp:     98.0 degrees F (36.67 degrees C) oral Pulse rate:   84 / minute BP sitting:   136 / 75  (right arm)  Vitals Entered By: Wendall Mola CMA Duncan Dull) (April 18, 2010 2:04 PM) CC: pt. c/o burn on left lower arm 8 days ago with hot wire/saw family Dr. yesterday and given silver sulfadiazine cream Is Patient Diabetic? No Pain Assessment Patient in pain? yes     Location: left lower arm Intensity: 3 Type: stinging/burning Onset of pain  Constant Nutritional Status BMI of 25 - 29 = overweight Nutritional Status Detail appetite "good"  Does patient need assistance? Functional Status Self care Ambulation Normal Comments no missed doses of Atripla pts. wound burns when he is  working   Physical Exam  General:  alert, well-developed, well-nourished, and well-hydrated.   Head:  normocephalic and atraumatic.   Skin:  half dollar sized burn without surrounding erythema.   Impression & Recommendations:  Problem # 1:  BURN, ARM (ICD-943.00)  continue silvadene cream call if erythema develops keep dry and clean  Orders: Est. Patient Level II (13086)

## 2010-12-07 NOTE — Progress Notes (Signed)
Summary: [facility] ADAP Atripla arrived  Phone Note Outgoing Call   Call placed by: Jennet Maduro RN,  October 06, 2010 10:19 AM Call placed to: Patient's son, who speaks [place] Action Taken: Assistance medications ready for pick up Summary of Call: Message left on son's cell phone.  Pt. does not speak [place].  Jennet Maduro RN  October 06, 2010 10:21 AM   Reather Converse ADAP rx #30 Tablets w/ 8 refills remaining.       Prescriptions: ATRIPLA 600-200-300 MG TABS (EFAVIRENZ-EMTRICITAB-TENOFOVIR) Take 1 tablet by mouth at bedtime  #30 x 9   Entered by:   Jennet Maduro RN   Authorized by:   Yisroel Ramming MD   Signed by:   Jennet Maduro RN on 10/06/2010   Method used:   Samples Given   RxID:   [id number]   Appended Document: [facility] ADAP Atripla arrived pt. picked up [facility] ADAP meds

## 2010-12-07 NOTE — Assessment & Plan Note (Signed)
Summary: ACID REFLUX/SS   Visit Type:  Initial Visit Primary Care Provider:  Justin Mend  Chief Complaint:  acid reflux.  History of Present Illness: c/o "heartburn" after eating chicken and lettuce last night,  c/o regurgitation.  Describes burning pain, radiates to back.  Pain 3/10 since last night.  c/o nausea & vomiting small amt.  Denies diaphoresis. Denies chest pressure or dizziness.  Denies hx cardiac problems or cardiac work-up.  Denies lower abd pain.  Denies dysphagia or odynophagia.  Denies rectal bleeding or melena.  Denies heavy lifting, but does use shovel to clean crumbs off floor.  Denies any acute injury.  Taking prevacid 15mg  daily.   Denies SOB or paresthesias. Not changed by movement.  Hx  GERD and HIV. Feels like GERD was well-controlled until this episode.  Current Medications (verified): 1)  Atripla 600-200-300 Mg Tabs (Efavirenz-Emtricitab-Tenofovir) .... Take 1 Tablet By Mouth At Bedtime 2)  Prevacid 15 Mg Cpdr (Lansoprazole) .... Take 1 Tablet By Mouth Once A Day  Allergies (verified): No Known Drug Allergies  Past History:  Past Medical History: Reviewed history from 01/22/2007 and no changes required. GERD HIV disease  Past Surgical History: Reviewed history from 07/25/2010 and no changes required. back  Review of Systems General:  Denies fever, chills, sweats, anorexia, fatigue, weakness, malaise, weight loss, and sleep disorder. CV:  Denies chest pains, angina, palpitations, syncope, dyspnea on exertion, orthopnea, PND, peripheral edema, and claudication. Resp:  Denies dyspnea at rest, dyspnea with exercise, cough, sputum, wheezing, coughing up blood, and pleurisy. GI:  Denies gas/bloating and fecal incontinence. GU:  Denies urinary burning, blood in urine, urinary frequency, urinary hesitancy, nocturnal urination, and urinary incontinence. MS:  Denies joint pain / LOM, joint swelling, joint stiffness, joint deformity, low back pain, muscle  weakness, muscle cramps, muscle atrophy, leg pain at night, leg pain with exertion, and shoulder pain / LOM hand / wrist pain (CTS). Derm:  Denies rash, itching, dry skin, hives, moles, warts, and unhealing ulcers. Psych:  Denies depression, anxiety, memory loss, suicidal ideation, hallucinations, paranoia, phobia, and confusion. Heme:  Denies bruising, bleeding, and enlarged lymph nodes.  Vital Signs:  Patient profile:   55 year old male Height:      66 inches Weight:      173 pounds BMI:     28.02 Temp:     98.4 degrees F oral Pulse rate:   88 / minute BP sitting:   110 / 78  (left arm) Cuff size:   regular  Vitals Entered By: Cloria Spring LPN (August 14, 2010 11:43 AM)  Physical Exam  General:  Well developed, well nourished, no acute distress. Head:  Normocephalic and atraumatic. Eyes:  Sclera clear, no icterus. Ears:  Normal auditory acuity. Nose:  No deformity, discharge,  or lesions. Mouth:  No deformity or lesions, dentition normal. Neck:  Supple; no masses or thyromegaly. Chest Wall:  Symmetrical,  no deformities . Left upper ant chest. Lungs:  Clear throughout to auscultation. Heart:  Regular rate and rhythm; no murmurs, rubs,  or bruits. Abdomen:  Soft, nontender and nondistended. No masses, hepatosplenomegaly or hernias noted. Normal bowel sounds.without guarding and without rebound.   Msk:  Symmetrical with no gross deformities. Normal posture. Pulses:  Normal pulses noted. Extremities:  No clubbing, cyanosis, edema or deformities noted. Neurologic:  Alert and  oriented x4;  grossly normal neurologically. Skin:  Intact without significant lesions or rashes. Cervical Nodes:  No significant cervical adenopathy. Psych:  Alert and cooperative. Normal  mood and affect.  Impression & Recommendations:  Problem # 1:  CHEST PAIN, ATYPICAL (ICD-786.59) 55 y/o hispanic male w/ less than 24 hr hx of atypical chest pain which I suspect is related to refractory GERD or  esophagitis, but cannot r/o cardiac component given radiation to back &  nausea & vomiting.  Given his age and the fact he has had no prior cardiac work-up, I do not feel comfortable sending him home without at minimum chest x-ray & EKG t/o rule out cardio/resp etiology.  He was referred to ER immediately & I contacted charge RN to let her know he was on the way.   Orders: Est. Patient Level III (60454)  Problem # 2:  GERD (ICD-530.81) See #1   PACIFIC INTERPRETER [phone]  Patient Instructions: 1)  Increase prevacid to 30mg  daily, #6 boxes given 2)  Pt voiced understanding

## 2010-12-07 NOTE — Progress Notes (Signed)
Summary: [facility] Samples arrived- Atripla  Phone Note Other Incoming   Caller: [name]  Samples Arrived-Atripla Summary of Call: Samples arrived. Atripla  pt informed. message left on voicemail Tomasita Morrow, RN lll     Prescriptions: ATRIPLA 600-200-300 MG TABS (EFAVIRENZ-EMTRICITAB-TENOFOVIR) Take 1 tablet by mouth at bedtime  #30 x 9   Entered by:   Tomasita Morrow RN   Authorized by:   Acey Lav MD   Signed by:   Tomasita Morrow RN on 09/08/2010   Method used:   Samples Given   RxID:   [id number]  [name]  #[id number]  Appended Document: [facility] Samples arrived- Atripla Patient picked up medications today. [name]  09/12/2010

## 2010-12-07 NOTE — Assessment & Plan Note (Signed)
Summary: F/U/VS   History of Present Illness: (Interpreter utilized) Pt c/o acid reflux.  He has not tried anything for it.  No nausea or vomiting.  No missed doses of his Atripla.   Updated Prior Medication List: ATRIPLA 600-200-300 MG TABS (EFAVIRENZ-EMTRICITAB-TENOFOVIR) Take 1 tablet by mouth at bedtime ACYCLOVIR 400 MG TABS (ACYCLOVIR) Take 1 tablet by mouth three times a day LOTRIMIN AF 1 % CREA (CLOTRIMAZOLE) apply two times a day PEPCID 20 MG TABS (FAMOTIDINE) Take 1 tablet by mouth two times a day  Current Allergies: No known allergies  Past History:  Past Medical History: Last updated: 01/22/2007 GERD HIV disease  Review of Systems  The patient denies anorexia, fever, weight loss, melena, and hematochezia.    Vital Signs:  Patient profile:   55 year old male Height:      66 inches Weight:      177 pounds Temp:     97 degrees F oral Pulse rate:   90 / minute BP sitting:   122 / 77  (left arm)  Vitals Entered By: Starleen Arms CMA (December 21, 2009 9:27 AM)  Physical Exam  General:  alert, well-developed, well-nourished, and well-hydrated.   Head:  normocephalic and atraumatic.   Mouth:  pharynx pink and moist.   Lungs:  normal breath sounds.     Impression & Recommendations:  Problem # 1:  HIV DISEASE (ICD-042) Pt.s most recent CD4ct was 660 and VL 135 .  Pt instructed to continue the current antiretroviral regimen.  Pt encouraged to take medication regularly and not miss doses.  Pt will f/u in 3 months for repeat blood work and will see me 2 weeks later.  Diagnostics Reviewed:  CD4: 660 (12/07/2009)   WBC: 5.4 (12/06/2009)   Hgb: 14.0 (12/06/2009)   HCT: 43.0 (12/06/2009)   Platelets: 245 (12/06/2009) HIV-1 RNA: 135 (12/06/2009)   HBSAg: NO (12/30/2006)  Problem # 2:  GERD (ICD-530.81) check h pylori ag His updated medication list for this problem includes:    Pepcid 20 Mg Tabs (Famotidine) .Marland Kitchen... Take 1 tablet by mouth two times a  day  Orders: T-H Pylori AG, EIA (64332)  Medications Added to Medication List This Visit: 1)  Pepcid 20 Mg Tabs (Famotidine) .... Take 1 tablet by mouth two times a day  Other Orders: Est. Patient Level III (95188) Future Orders: T-CD4SP ([facility]) (CD4SP) ... 03/21/2010 T-HIV Viral Load [phone]) ... 03/21/2010 T-Comprehensive Metabolic Panel [id number]) ... 03/21/2010 T-CBC w/Diff ([id number]) ... 03/21/2010       Medication Adherence: 12/21/2009   Adherence to medications reviewed with patient. Counseling to provide adequate adherence provided   Prevention For Positives: 12/21/2009   Safe sex practices discussed with patient. Condoms offered.                              Patient Instructions: 1)  Please schedule a follow-up appointment in 3 months, 2 weeks after labs. Prescriptions: PEPCID 20 MG TABS (FAMOTIDINE) Take 1 tablet by mouth two times a day  #60 x 3   Entered and Authorized by:   Yisroel Ramming MD   Signed by:   Yisroel Ramming MD on 12/21/2009   Method used:   Print then Give to Patient   RxID:   [id number]   Appended Document: Orders Update    Clinical Lists Changes  Orders: Added new Test order of T-Helicobacter AB - IgG [phone]) - Signed  Process Orders Check Orders Results:     [facility]: ABN not required for this insurance Tests Sent for requisitioning (December 21, 2009 2:33 PM):     12/21/2009: [facility] -- T-Helicobacter AB - IgG [phone] (signed)

## 2010-12-07 NOTE — Assessment & Plan Note (Signed)
Summary: 6 MONTH F/U GERD/LAW   Visit Type:  Follow-up Visit Primary Care Provider:  Nelva Bush  Chief Complaint:  follow up and still has some burning at times.  History of Present Illness: 55 year old gentleman with GERD and HIV. Here for 6 month followup. Reflux symptoms controlled previously on AcipHex and Nexium at different times. Since she was last seen here in February, he was switched out from Nexium to Prevacid 15 mg orally daily apparently by Dr. Nelva Bush. Patient states he was having some upper abdominal pain associated with Nexium he stopped his age and start Prevacid and he feels much that her. He continues to complain of intermittent vague burning right lower quadrant abdominal pain after 6. Reportedly urology workup was negative. Prior CT in abdominal ultrasound also negative. Fleeting right lower quadrant on plain not related to food or bowel function. Last colonoscopy EGD back in 2007 without significant findings.  Overall he states he's doing well at this time.  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 Take 1 Tablet By Mouth At Bedtime 2)  Prevacid 15 Mg Cpdr (Lansoprazole) .... Take 1 Tablet By Mouth Once A Day  Allergies (verified): No Known Drug Allergies  Past History:  Past Medical History: Last updated: 01/22/2007 GERD HIV disease  Risk Factors: Alcohol Use: 0  (07/19/2010) Caffeine Use: 0 (07/19/2010) Exercise: yes (07/19/2010)  Risk Factors: Smoking Status: never (07/19/2010)  Past Surgical History: back  Vital Signs:  Patient profile:   55 year old male Height:      66 inches Weight:      173 pounds BMI:     28.02 Temp:     97.5 degrees F oral Pulse rate:   64 / minute BP sitting:   120 / 88  (left arm) Cuff size:   regular  Vitals Entered By: Hendricks Limes LPN (July 25, 2010 9:32 AM)  Physical Exam  General:  alert conversant but appears well today Eyes:  no scleral icterus. Abdomen:  flat. Tattoos present. Soft nontender without appreciable mass or organomegaly  Impression & Recommendations: Impression: 55 year old gentleman with HIV and GERD. GERD symptoms well-controlled with Prevacid 15 mg orally daily. Fleeting right lower quadrant with burning" type pain exclusively associated sexual relations not likely GI in origin.  Recommendations: Continue Prevacid 15 mg orally daily for the management of GERD. One year refill.   Reviewed antireflux diet/lifestyle with the patient.  Followup with his PCP and Dr. Philipp Deputy as directed. Unless something comes in the interim, we'll plan to see  him back in one year.  Appended Document: Orders Update    Clinical Lists Changes  Orders: Added new Service order of Est. Patient Level III (16109) - Signed

## 2010-12-07 NOTE — Progress Notes (Signed)
Summary: [facility]/pt assist med arrived for Mar  Phone Note Refill Request      Prescriptions: ATRIPLA 600-200-300 MG TABS (EFAVIRENZ-EMTRICITAB-TENOFOVIR) Take 1 tablet by mouth at bedtime  #30 x 0   Entered by:   Paulo Fruit  BS,CPht II,MPH   Authorized by:   Yisroel Ramming MD   Signed by:   Paulo Fruit  BS,CPht II,MPH on 01/10/2010   Method used:   Samples Given   RxID:   [id number]   Patient Assist Medication Verification: Medication: Atripla Lot# [id number] Exp Date:07 2013 Tech approval:MLD Call placed to patient with message that assistance medications are ready for pick-up. Paulo Fruit  BS,CPht II,MPH  January 10, 2010 4:51 PM

## 2010-12-07 NOTE — Progress Notes (Signed)
Summary: [place] assist med arrived for jan  Phone Note Refill Request      Prescriptions: ATRIPLA 600-200-300 MG TABS (EFAVIRENZ-EMTRICITAB-TENOFOVIR) Take 1 tablet by mouth at bedtime  #30 x 0   Entered by:   Paulo Fruit  BS,CPht II,MPH   Authorized by:   Yisroel Ramming MD   Signed by:   Paulo Fruit  BS,CPht II,MPH on 11/16/2009   Method used:   Samples Given   RxID:   [id number]   Patient Assist Medication Verification: Medication: Atripla Lot# [id number] Exp Date:06 2013 Tech approval:MLD Call placed to patient with message that assistance medications are ready for pick-up. Paulo Fruit  BS,CPht II,MPH  November 16, 2009 4:45 PM

## 2010-12-07 NOTE — Progress Notes (Signed)
Summary: Luis Gomez ADAP med arrived for Aug nxt order 07/29/10  Phone Note Refill Request      Prescriptions: ATRIPLA 600-200-300 MG TABS (EFAVIRENZ-EMTRICITAB-TENOFOVIR) Take 1 tablet by mouth at bedtime  #30 x 0   Entered by:   Paulo Fruit  BS,CPht II,MPH   Authorized by:   Yisroel Ramming MD   Signed by:   Paulo Fruit  BS,CPht II,MPH on 07/05/2010   Method used:   Samples Given   RxID:   [id number]  Patient Assist Medication Verification: Medication name: Atripla RX # [id number] Tech approval:MLD  Call placed to patient with message that assistance medications are ready for pick-up. Paulo Fruit  BS,CPht II,MPH  July 05, 2010 8:43 AM

## 2010-12-07 NOTE — Assessment & Plan Note (Signed)
Summary: 71month f/u Interpreter call [mkj]   Primary Provider:  Justin Mend  CC:  follow-up visit and lab results.  History of Present Illness: patient here with interpreter.  He has been feeling well.  He is not missed any doses of his medication.  He does have a small lump on his right shoulder that he would like to have looked at because it is bothering him.  It is not painful but it does give him his way when he is at work.  Preventive Screening-Counseling & Management  Alcohol-Tobacco     Alcohol drinks/day: 0     Smoking Status: never  Caffeine-Diet-Exercise     Caffeine use/day: 0     Does Patient Exercise: yes     Type of exercise: walking     Exercise (avg: min/session): 30-60     Times/week: 7  Safety-Violence-Falls     Seat Belt Use: yes      Sexual History:  n/a.    Comments: pt. given condoms   Updated Prior Medication List: ATRIPLA 600-200-300 MG TABS (EFAVIRENZ-EMTRICITAB-TENOFOVIR) Take 1 tablet by mouth at bedtime PREVACID 15 MG CPDR (LANSOPRAZOLE) Take 1 tablet by mouth once a day  Current Allergies (reviewed today): No known allergies  Past History:  Past Medical History: Last updated: 01/22/2007 GERD HIV disease  Review of Systems  The patient denies anorexia, fever, weight loss, and abdominal pain.    Vital Signs:  Patient profile:   55 year old male Height:      66 inches (167.64 cm) Weight:      180.12 pounds (81.87 kg) BMI:     29.18 Temp:     97.5 degrees F (36.39 degrees C) oral Pulse rate:   69 / minute BP sitting:   122 / 77  (left arm)  Vitals Entered By: Wendall Mola CMA Duncan Dull) (November 13, 2010 8:52 AM) CC: follow-up visit, lab results Is Patient Diabetic? No Pain Assessment Patient in pain? no      Nutritional Status BMI of 25 - 29 = overweight Nutritional Status Detail appetite "good"  Have you ever been in a relationship where you felt threatened, hurt or afraid?Unable to ask   Does patient need  assistance? Functional Status Self care Ambulation Normal Comments no missed doses of meds per pt.   Physical Exam  General:  alert, well-developed, well-nourished, and well-hydrated.   Head:  normocephalic and atraumatic.   Mouth:  pharynx pink and moist.   Lungs:  normal breath sounds.   Skin:  half dollar sized lipoma on right shoulder   Impression & Recommendations:  Problem # 1:  HIV DISEASE (ICD-042) Pt.s most recent CD4ct was 550 and VL 23 .  Pt instructed to continue the current antiretroviral regimen.  Pt encouraged to take medication regularly and not miss doses.  Pt will f/u in 3 months for repeat blood work and will see me 2 weeks later.  Diagnostics Reviewed:  HIV: CDC-defined AIDS (12/21/2009)   CD4: 550 (10/06/2010)   WBC: 5.0 (10/05/2010)   Hgb: 14.1 (10/05/2010)   HCT: 43.9 (10/05/2010)   Platelets: 247 (10/05/2010) HIV-1 RNA: 23 (10/05/2010)   HBSAg: NO (12/30/2006)  Problem # 2:  LIPOMA (ICD-214.9) Pt will check tosee if insurance will cover removal.  Other Orders: Est. Patient Level III (40981) Future Orders: T-CD4SP ([facility]) (CD4SP) ... 02/11/2011 T-HIV Viral Load [phone]) ... 02/11/2011 T-Comprehensive Metabolic Panel [phone]) ... 02/11/2011 T-CBC w/Diff ([id number]) ... 02/11/2011 T-PSA ([id number]) ... 02/11/2011  Patient Instructions:  1)  Please schedule a follow-up appointment in 3 months, 2 weeks after labs.

## 2010-12-07 NOTE — Progress Notes (Signed)
Summary: [facility] adap med arrived for sept-pt aware  Phone Note Refill Request      Prescriptions: ATRIPLA 600-200-300 MG TABS (EFAVIRENZ-EMTRICITAB-TENOFOVIR) Take 1 tablet by mouth at bedtime  #30 x 0   Entered by:   Paulo Fruit  BS,CPht II,MPH   Authorized by:   Yisroel Ramming MD   Signed by:   Paulo Fruit  BS,CPht II,MPH on 07/31/2010   Method used:   Samples Given   RxID:   [id number]  Patient Assist Medication Verification: Medication name: Neva Seat # [id number] Tech approval:mld Call placed to patient with message that assistance medications are ready for pick-up. Patient said he will pick up sometime this week Paulo Fruit  BS,CPht II,MPH  July 31, 2010 3:10 PM  Appended Document: Ronn Melena adap med arrived for sept-pt aware Prescription/Samples picked up by: patient

## 2010-12-07 NOTE — Assessment & Plan Note (Signed)
Summary: GERD/GLU   Visit Type:  f/u Primary Care Provider:  None  Chief Complaint:  reflux.  History of Present Illness: Patient is here for one year f/u visit of gerd. Since last OV here in 12/09, he came off Aciphex. On 2/16, he saw Dr. Philipp Deputy, and c/o acid reflux. H. Pylori serologies were negative. He was started on pepcid. With use of spanish interpreter [phone], patient c/o recent onset heartburn, luq pain associated with recent constipation. Constipation has now resolved. His luq pain is almost completely resolved but has some very mild heartburn. Denies melena, brbpr, wt loss. His CD4 count recently was 660, VL 135. He continues to work daily without any fatigue, SOB.   Current Medications (verified): 1)  Atripla 600-200-300 Mg Tabs (Efavirenz-Emtricitab-Tenofovir) .... Take 1 Tablet By Mouth At Bedtime 2)  Pepcid 20 Mg Tabs (Famotidine) .... Take 1 Tablet By Mouth Two Times A Day  Allergies (verified): No Known Drug Allergies  Vital Signs:  Patient profile:   55 year old male Height:      66 inches Weight:      177 pounds BMI:     28.67 Temp:     97.1 degrees F oral Pulse rate:   64 / minute BP sitting:   118 / 80  (left arm) Cuff size:   regular  Vitals Entered By: Hendricks Limes LPN (December 26, 2009 10:55 AM)  Physical Exam  General:  Well developed, well nourished, no acute distress. Head:  Normocephalic and atraumatic. Eyes:  Sclera nonicteric. Mouth:  OP moist. Abdomen:  Bowel sounds normal.  Abdomen is soft, nontender, nondistended.  No rebound or guarding.  No hepatosplenomegaly, masses or hernias.  No abdominal bruits.  Extremities:  No clubbing, cyanosis, edema or deformities noted. Neurologic:  Alert and  oriented x4;  grossly normal neurologically. Skin:  Intact without significant lesions or rashes. Psych:  Alert and cooperative. Normal mood and affect.  Impression & Recommendations:  Problem # 1:  GERD (ICD-530.81)  Recent flare off PPI again. He  is doing better on Pepcid but not back to baseline. I recommend nexium once daily for next four weeks and then he can have trial back on pepcid. However, if symptoms recur, he should stay on nexium indefinetly. He voiced understanding. He was provided with written instructions and literature on GERD. He should call with any further problems. OV in six months with Dr. Jena Gauss.  Orders: Est. Patient Level II (09811)  Patient Instructions: 1)  Begin Nexium 40mg  one capsule 30 minutes before breakfast. 2)  Stop Pepcid while on Nexium. Once you finish the nexium you may resume pepcid but if you reflux symptoms come back you will need to stay on nexium chronically.  3)  The medication list was reviewed and reconciled.  All changed / newly prescribed medications were explained.  A complete medication list was provided to the patient / caregiver.  CC: Dr. Collene Schlichter

## 2010-12-07 NOTE — Miscellaneous (Signed)
Summary: ADAP Update   Clinical Lists Changes  Patient is currently on [facility] through 02/03/11 - Per ADAP since he has [employer] which caps at $5,000 - he needs to use that first before reapplying for ADAP - he can wait until he has about 1200 left and then apply for ADAP - will call patient to let him know.

## 2010-12-07 NOTE — Miscellaneous (Signed)
Summary: clinical update/ryan white [place] app completed  Clinical Lists Changes  Observations: Added new observation of #CHILD<18 IN: Yes (12/29/2009 15:55) Added new observation of FAMILYSIZE: 2  (12/29/2009 15:55) Added new observation of FINASSESSDT: 11/10/2009  (12/29/2009 15:55) Added new observation of PCTFPL: 219.35  (12/29/2009 15:55) Added new observation of HOUSEINCOME: 04540  (12/29/2009 15:55)

## 2010-12-07 NOTE — Assessment & Plan Note (Signed)
Summary: 3 months f/u [mkj]   Primary Provider:  None  CC:  follow-up visit, lab results, and c/o tenderness right side of abdomen x 1 week.  History of Present Illness: (Interpretor utilized) Pt doing well. He still has some right lower quadrant pain that occurs after sex.  He saw a Urologist who could not find an etiology.  Preventive Screening-Counseling & Management  Alcohol-Tobacco     Alcohol drinks/day: 0     Smoking Status: never  Caffeine-Diet-Exercise     Caffeine use/day: 0     Does Patient Exercise: yes     Type of exercise: walking     Exercise (avg: min/session): 30-60     Times/week: 7  Safety-Violence-Falls     Seat Belt Use: yes      Sexual History:  n/a.    Comments: pt. given condoms   Updated Prior Medication List: ATRIPLA 600-200-300 MG TABS (EFAVIRENZ-EMTRICITAB-TENOFOVIR) Take 1 tablet by mouth at bedtime PREVACID 15 MG CPDR (LANSOPRAZOLE) Take 1 tablet by mouth once a day  Current Allergies (reviewed today): No known allergies  Past History:  Past Medical History: Last updated: 01/22/2007 GERD HIV disease  Review of Systems  The patient denies anorexia, fever, and weight loss.    Vital Signs:  Patient profile:   55 year old male Height:      66 inches (167.64 cm) Weight:      175.0 pounds (79.55 kg) BMI:     28.35 Temp:     98.1 degrees F (36.72 degrees C) oral Pulse rate:   79 / minute BP sitting:   130 / 78  (left arm)  Vitals Entered By: Wendall Mola CMA Duncan Dull) (July 19, 2010 9:03 AM) CC: follow-up visit, lab results, c/o tenderness right side of abdomen x 1 week Is Patient Diabetic? No Pain Assessment Patient in pain? yes     Location: abdomen Intensity: 3 Type: tenderness Onset of pain  Intermittent Nutritional Status BMI of 25 - 29 = overweight Nutritional Status Detail appetite "good"  Does patient need assistance? Functional Status Self care Ambulation Normal Comments no missed doses of meds per  patient   Physical Exam  General:  alert, well-developed, well-nourished, and well-hydrated.   Head:  normocephalic and atraumatic.   Mouth:  pharynx pink and moist.   Lungs:  normal breath sounds.   Abdomen:  soft and non-tender.      Impression & Recommendations:  Problem # 1:  HIV DISEASE (ICD-042) Pt.s most recent CD4ct was 660 and VL <48 .  Pt instructed to continue the current antiretroviral regimen.  Pt encouraged to take medication regularly and not miss doses.  Pt will f/u in 3 months for repeat blood work and will see me 2 weeks later.  Influenza vaccine given.  Diagnostics Reviewed:  HIV: CDC-defined AIDS (12/21/2009)   CD4: 660 (07/05/2010)   WBC: 5.2 (07/04/2010)   Hgb: 14.1 (07/04/2010)   HCT: 43.7 (07/04/2010)   Platelets: 246 (07/04/2010) HIV-1 RNA: <48 copies/mL (07/04/2010)   HBSAg: NO (12/30/2006)  Medications Added to Medication List This Visit: 1)  Prevacid 15 Mg Cpdr (Lansoprazole) .... Take 1 tablet by mouth once a day  Other Orders: Influenza Vaccine NON MCR (16109) Est. Patient Level III (60454) Future Orders: T-CD4SP ([facility]) (CD4SP) ... 10/17/2010 T-HIV Viral Load [phone]) ... 10/17/2010 T-Comprehensive Metabolic Panel [phone]) ... 10/17/2010 T-CBC w/Diff ([id number]) ... 10/17/2010 T-RPR (Syphilis) [phone]) ... 10/17/2010  Patient Instructions: 1)  Please schedule a follow-up appointment in 3 months,  2 weeks after labs.    Immunizations Administered:  Influenza Vaccine # 1:    Vaccine Type: Fluvax Non-MCR    Site: left deltoid    Mfr: Novartis    Dose: 0.5 ml    Route: IM    Given by: Wendall Mola CMA ( AAMA)    Exp. Date: 02/04/2011    Lot #: [id number]    VIS given: 05/30/10 version given July 19, 2010.  Flu Vaccine Consent Questions:    Do you have a history of severe allergic reactions to this vaccine? no    Any prior history of allergic reactions to egg and/or gelatin? no    Do you have a sensitivity to  the preservative Thimersol? no    Do you have a past history of Guillan-Barre Syndrome? no    Do you currently have an acute febrile illness? no    Have you ever had a severe reaction to latex? no    Vaccine information given and explained to patient? yes

## 2010-12-07 NOTE — Miscellaneous (Signed)
Summary: [facility] [employer] Update  Clinical Lists Changes  Observations: Added new observation of PCTFPL: 260.25  (11/20/2010 11:03) Added new observation of HOUSEINCOME: 04540  (11/20/2010 11:03) Added new observation of FINASSESSDT: 11/20/2010  (11/20/2010 11:03) Added new observation of YEARLYEXPEN: 1760  (11/20/2010 11:03) Added new observation of AIDSDAP: Yes 2011-[facility]  (11/20/2010 11:03)     rw

## 2010-12-07 NOTE — Progress Notes (Signed)
Summary: PAP rx ready for pick up  Phone Note Outgoing Call   Call placed by: Jennet Maduro RN,  November 08, 2010 11:56 AM Call placed to: Patient Action Taken: Assistance medications ready for pick up Summary of Call: Pt. notified. Atripla #30 tablets exp 05/2014 lot # [id number] Jennet Maduro RN  November 08, 2010 11:58 AM     Prescriptions: ATRIPLA 600-200-300 MG TABS (EFAVIRENZ-EMTRICITAB-TENOFOVIR) Take 1 tablet by mouth at bedtime  #30 x 0   Entered by:   Jennet Maduro RN   Authorized by:   Acey Lav MD   Signed by:   Jennet Maduro RN on 11/08/2010   Method used:   Samples Given   RxID:   [id number]   Appended Document: PAP rx ready for pick up pt. picked up Atripla

## 2010-12-07 NOTE — Progress Notes (Signed)
Summary: [facility]/pt assist med arrived forFeb  Phone Note Refill Request      Prescriptions: ATRIPLA 600-200-300 MG TABS (EFAVIRENZ-EMTRICITAB-TENOFOVIR) Take 1 tablet by mouth at bedtime  #30 x 0   Entered by:   Paulo Fruit  BS,CPht II,MPH   Authorized by:   Yisroel Ramming MD   Signed by:   Paulo Fruit  BS,CPht II,MPH on 12/17/2009   Method used:   Samples Given   RxID:   [id number]   Patient Assist Medication Verification: Medication: [name] Lot# [id number] Exp Date:06 2012 Tech approval:MLD Will call pt on Monday 12/19/09 Paulo Fruit  BS,CPht II,MPH  December 17, 2009 11:08 AM

## 2010-12-07 NOTE — Progress Notes (Signed)
Summary: medication question  Phone Note Call from Patient   Caller: Patient Reason for Call: Talk to Nurse Summary of Call: Pt. called with medication question.  I called him back using translater and he said he went to another Dr. for his back pain and was prescribed Hydrocodone 5/500 mg. and Cyclobenzaprine 5 mg. and wanted to be sure it was safe to take with his Atripla.  I told him it was and he should bring these new medications with him at his next appt. with Dr. Philipp Deputy. Initial call taken by: Wendall Mola CMA ( AAMA),  June 09, 2010 1:20 PM

## 2010-12-07 NOTE — Progress Notes (Signed)
Summary: sample given [place] still pending  Phone Note Call from Patient   Caller: Patient Details for Reason: want sample Summary of Call: Patient walked in.  Need another sample of Atripla.  ADAP application still pending.   Initial call taken by: Paulo Fruit  BS,CPht II,MPH,  May 24, 2010 10:20 AM    Prescriptions: ATRIPLA 600-200-300 MG TABS (EFAVIRENZ-EMTRICITAB-TENOFOVIR) Take 1 tablet by mouth at bedtime  #30 x 0   Entered by:   Paulo Fruit  BS,CPht II,MPH   Authorized by:   Yisroel Ramming MD   Signed by:   Paulo Fruit  BS,CPht II,MPH on 05/24/2010   Method used:   Samples Given   RxID:   [id number]  Sample Given, Lot #: [name] [id number] Expiration Date:07 2013 Patient has been instructed regarding the correct time, dose and frequency of taking this med, including desired effects and most common side effects. Paulo Fruit  BS,CPht II,MPH  May 24, 2010 10:27 AM

## 2011-01-01 ENCOUNTER — Telehealth (INDEPENDENT_AMBULATORY_CARE_PROVIDER_SITE_OTHER): Payer: Self-pay | Admitting: Licensed Clinical Social Worker

## 2011-01-11 NOTE — Progress Notes (Signed)
  Phone Note Outgoing Call   Call placed by: Starleen Arms CMA,  January 01, 2011 9:31 AM Call placed to: Patient Action Taken: Phone Call Completed Summary of Call: Patient assistance medication Atripla arrived  Initial call taken by: Starleen Arms CMA,  January 01, 2011 9:32 AM

## 2011-01-16 ENCOUNTER — Encounter: Payer: Self-pay | Admitting: Infectious Diseases

## 2011-01-16 ENCOUNTER — Other Ambulatory Visit: Payer: Self-pay | Admitting: Infectious Diseases

## 2011-01-16 ENCOUNTER — Encounter: Payer: Self-pay | Admitting: *Deleted

## 2011-01-16 ENCOUNTER — Other Ambulatory Visit (INDEPENDENT_AMBULATORY_CARE_PROVIDER_SITE_OTHER): Payer: Self-pay

## 2011-01-16 DIAGNOSIS — B20 Human immunodeficiency virus [HIV] disease: Secondary | ICD-10-CM

## 2011-01-16 LAB — CONVERTED CEMR LAB
ALT: 23 units/L (ref 0–53)
AST: 20 units/L (ref 0–37)
Alkaline Phosphatase: 67 units/L (ref 39–117)
Basophils Relative: 1 % (ref 0–1)
CO2: 26 meq/L (ref 19–32)
Creatinine, Ser: 0.89 mg/dL (ref 0.40–1.50)
Eosinophils Absolute: 0.5 10*3/uL (ref 0.0–0.7)
Eosinophils Relative: 9 % — ABNORMAL HIGH (ref 0–5)
HCT: 42.5 % (ref 39.0–52.0)
HIV 1 RNA Quant: <20 copies/mL (ref <20)
MCHC: 32.9 g/dL (ref 30.0–36.0)
MCV: 83.2 fL (ref 78.0–100.0)
Neutrophils Relative %: 44 % (ref 43–77)
PSA: 0.94 ng/mL (ref <=4.00)
Platelets: 237 10*3/uL (ref 150–400)
RDW: 13.1 % (ref 11.5–15.5)
Sodium: 140 meq/L (ref 135–145)
Total Bilirubin: 0.3 mg/dL (ref 0.3–1.2)
Total Protein: 6.6 g/dL (ref 6.0–8.3)

## 2011-01-17 LAB — DIFFERENTIAL
Basophils Relative: 0 % (ref 0–1)
Lymphs Abs: 1.1 10*3/uL (ref 0.7–4.0)
Monocytes Absolute: 0.3 10*3/uL (ref 0.1–1.0)
Monocytes Relative: 7 % (ref 3–12)
Neutro Abs: 3.5 10*3/uL (ref 1.7–7.7)

## 2011-01-17 LAB — BASIC METABOLIC PANEL
BUN: 14 mg/dL (ref 6–23)
Chloride: 105 mEq/L (ref 96–112)
Potassium: 3.8 mEq/L (ref 3.5–5.1)
Sodium: 136 mEq/L (ref 135–145)

## 2011-01-17 LAB — CBC
HCT: 41.7 % (ref 39.0–52.0)
Hemoglobin: 14 g/dL (ref 13.0–17.0)
MCH: 28.2 pg (ref 26.0–34.0)
MCHC: 33.6 g/dL (ref 30.0–36.0)
RBC: 4.98 MIL/uL (ref 4.22–5.81)

## 2011-01-17 LAB — T-HELPER CELL (CD4) - (RCID CLINIC ONLY): CD4 % Helper T Cell: 32 % — ABNORMAL LOW (ref 33–55)

## 2011-01-17 LAB — POCT CARDIAC MARKERS
CKMB, poc: <1 ng/mL — ABNORMAL LOW (ref 1.0–8.0)
Troponin i, poc: <0.05 ng/mL (ref 0.00–0.09)

## 2011-01-19 LAB — T-HELPER CELL (CD4) - (RCID CLINIC ONLY)
CD4 % Helper T Cell: 32 % — ABNORMAL LOW (ref 33–55)
CD4 T Cell Abs: 660 uL (ref 400–2700)

## 2011-01-22 LAB — T-HELPER CELL (CD4) - (RCID CLINIC ONLY): CD4 % Helper T Cell: 27 % — ABNORMAL LOW (ref 33–55)

## 2011-01-23 NOTE — Miscellaneous (Signed)
Summary: rw update  Clinical Lists Changes

## 2011-01-31 ENCOUNTER — Other Ambulatory Visit: Payer: Self-pay | Admitting: *Deleted

## 2011-01-31 ENCOUNTER — Encounter: Payer: Self-pay | Admitting: Infectious Diseases

## 2011-01-31 ENCOUNTER — Ambulatory Visit (INDEPENDENT_AMBULATORY_CARE_PROVIDER_SITE_OTHER): Payer: Self-pay | Admitting: Infectious Diseases

## 2011-01-31 DIAGNOSIS — E119 Type 2 diabetes mellitus without complications: Secondary | ICD-10-CM

## 2011-01-31 DIAGNOSIS — B20 Human immunodeficiency virus [HIV] disease: Secondary | ICD-10-CM

## 2011-01-31 MED ORDER — EFAVIRENZ-EMTRICITAB-TENOFOVIR 600-200-300 MG PO TABS: 1.0000 | ORAL_TABLET | Freq: Every day | ORAL | Status: DC

## 2011-01-31 NOTE — Progress Notes (Signed)
  Subjective:    Patient ID: Luis Gomez, male    DOB: January 08, 1956, 55 y.o.   MRN: [id number]  HPI 55 yo M with hx of DM2 and HIV+. Is on no medicine for his DM. Was Dx with 2007 with routine exam (not in hospital).  CD4 650 and VL <20 (01-16-11). No problems with Luis Gomez (his only ART to date).    Review of Systems  Respiratory: Negative for cough and shortness of breath.   Gastrointestinal: Negative for diarrhea and constipation.  Neurological: Negative for headaches.  has occas itching and burning at his rectum after he has been up walking for a long time.      Objective:   Physical Exam  Constitutional: He appears well-developed and well-nourished.  HENT:  Mouth/Throat: He does not have dentures. Normal dentition.  Eyes: EOM are normal. Pupils are equal, round, and reactive to light.  Neck: Neck supple.  Cardiovascular: Normal rate, regular rhythm and normal heart sounds.   Pulmonary/Chest: Effort normal and breath sounds normal.  Abdominal: Soft. Bowel sounds are normal.            Assessment & Plan:

## 2011-01-31 NOTE — Assessment & Plan Note (Signed)
He is doing very well. He has condoms. He states that his girlfriend wants to have a baby. i explained that he must tell his partner that there is a risk she will get infected with this (she is currently negative). Reinforced to his that the most important issue is that he continues to take his meds. Will see him back in 4 months with labs prior.

## 2011-01-31 NOTE — Assessment & Plan Note (Signed)
Will check his A1C at f/u.

## 2011-02-01 ENCOUNTER — Telehealth: Payer: Self-pay | Admitting: *Deleted

## 2011-02-01 NOTE — Telephone Encounter (Signed)
Atripla from [place] arrived in mail. LM for pt to stop by & get when able. They are in med room

## 2011-02-02 ENCOUNTER — Encounter (INDEPENDENT_AMBULATORY_CARE_PROVIDER_SITE_OTHER): Payer: Self-pay | Admitting: *Deleted

## 2011-02-06 NOTE — Miscellaneous (Signed)
  Clinical Lists Changes 

## 2011-02-08 LAB — T-HELPER CELL (CD4) - (RCID CLINIC ONLY)
CD4 % Helper T Cell: 30 % — ABNORMAL LOW (ref 33–55)
CD4 T Cell Abs: 760 uL (ref 400–2700)

## 2011-02-11 LAB — T-HELPER CELL (CD4) - (RCID CLINIC ONLY): CD4 T Cell Abs: 620 uL (ref 400–2700)

## 2011-02-14 LAB — T-HELPER CELL (CD4) - (RCID CLINIC ONLY)
CD4 % Helper T Cell: 28 % — ABNORMAL LOW (ref 33–55)
CD4 T Cell Abs: 560 uL (ref 400–2700)

## 2011-02-22 ENCOUNTER — Ambulatory Visit (INDEPENDENT_AMBULATORY_CARE_PROVIDER_SITE_OTHER): Payer: 59 | Admitting: Adult Health

## 2011-02-22 VITALS — BP 125/80 | HR 88 | Temp 97.9°F | Ht 66.0 in | Wt 172.0 lb

## 2011-02-22 DIAGNOSIS — N390 Urinary tract infection, site not specified: Secondary | ICD-10-CM

## 2011-02-22 LAB — URINALYSIS, ROUTINE W REFLEX MICROSCOPIC
Bilirubin Urine: NEGATIVE
Glucose, UA: NEGATIVE mg/dL
Ketones, ur: NEGATIVE mg/dL
Specific Gravity, Urine: 1.015 (ref 1.005–1.030)

## 2011-02-22 LAB — POCT URINALYSIS DIPSTICK
Bilirubin, UA: NEGATIVE
Glucose, UA: NEGATIVE
Nitrite, UA: NEGATIVE
Spec Grav, UA: 1.02
Urobilinogen, UA: 0.2
pH, UA: 7.5

## 2011-02-22 MED ORDER — CIPROFLOXACIN HCL 500 MG PO TABS: 500.0000 mg | ORAL_TABLET | Freq: Two times a day (BID) | ORAL | Status: DC

## 2011-02-22 MED ORDER — CIPROFLOXACIN HCL 500 MG PO TABS: 500.0000 mg | ORAL_TABLET | Freq: Two times a day (BID) | ORAL | Status: AC

## 2011-02-23 LAB — GC/CHLAMYDIA PROBE AMP, URINE
Chlamydia, Swab/Urine, PCR: NEGATIVE
GC Probe Amp, Urine: NEGATIVE

## 2011-02-26 ENCOUNTER — Ambulatory Visit: Payer: Self-pay | Admitting: Infectious Diseases

## 2011-03-05 ENCOUNTER — Ambulatory Visit: Payer: Self-pay | Admitting: Infectious Diseases

## 2011-03-08 ENCOUNTER — Other Ambulatory Visit: Payer: Self-pay | Admitting: *Deleted

## 2011-03-08 DIAGNOSIS — B20 Human immunodeficiency virus [HIV] disease: Secondary | ICD-10-CM

## 2011-03-08 MED ORDER — EFAVIRENZ-EMTRICITAB-TENOFOVIR 600-200-300 MG PO TABS: 1.0000 | ORAL_TABLET | Freq: Every day | ORAL | Status: DC

## 2011-03-13 ENCOUNTER — Telehealth: Payer: Self-pay | Admitting: *Deleted

## 2011-03-13 NOTE — Telephone Encounter (Signed)
He came in c/o a cold. We have no appts today. Sent to [facility]. He agreed with this

## 2011-03-15 ENCOUNTER — Ambulatory Visit (INDEPENDENT_AMBULATORY_CARE_PROVIDER_SITE_OTHER): Payer: Self-pay | Admitting: Adult Health

## 2011-03-15 DIAGNOSIS — B20 Human immunodeficiency virus [HIV] disease: Secondary | ICD-10-CM

## 2011-03-15 DIAGNOSIS — Z79899 Other long term (current) drug therapy: Secondary | ICD-10-CM

## 2011-03-15 NOTE — Progress Notes (Signed)
  Subjective:    Patient ID: Luis Gomez, male    DOB: Sep 06, 1956, 55 y.o.   MRN: [id number]  HPI (seen today with interpreter present.), Presents for followup. States recently he had a head cold with some cough, and was seen by a doctor in yesterday and given an antibiotic of amoxicillin. States he is feeling better since starting the medication. Remains adherent to his antiretrovirals with good tolerance. Voices no additional complaints and denies any urinary symptoms, as previously, when he was last seen in clinic.    Review of Systems  Constitutional: Negative.   HENT: Positive for rhinorrhea, postnasal drip and sinus pressure.   Respiratory: Positive for cough. Negative for shortness of breath, wheezing and stridor.   Cardiovascular: Negative.   Gastrointestinal: Negative.        Some postprandial reflux that is well controlled with lansoprazole therapy.  Genitourinary: Negative for dysuria, urgency, frequency, hematuria, flank pain, decreased urine volume, discharge, penile swelling, scrotal swelling, enuresis, difficulty urinating, genital sores, penile pain and testicular pain.  Musculoskeletal: Negative.   Skin: Negative.   Neurological: Negative.   Hematological: Negative.   Psychiatric/Behavioral: Negative.        Objective:   Physical Exam  Constitutional: He is oriented to person, place, and time. He appears well-developed and well-nourished. No distress.  HENT:  Head: Normocephalic and atraumatic.  Right Ear: External ear normal.  Left Ear: External ear normal.  Nose: Nose normal.  Mouth/Throat: Oropharynx is clear and moist. No oropharyngeal exudate.       Slight postnasal drainage  Eyes: Conjunctivae and EOM are normal. Pupils are equal, round, and reactive to light. Right eye exhibits no discharge. Left eye exhibits no discharge.  Neck: Normal range of motion. Neck supple. No tracheal deviation present. No thyromegaly present.  Cardiovascular: Normal rate,  regular rhythm, normal heart sounds and intact distal pulses.   Pulmonary/Chest: Effort normal and breath sounds normal. He has no wheezes. He has no rales. He exhibits no tenderness.  Abdominal: Soft. Bowel sounds are normal.  Musculoskeletal: Normal range of motion. He exhibits no edema and no tenderness.  Lymphadenopathy:    He has no cervical adenopathy.  Neurological: He is alert and oriented to person, place, and time. No cranial nerve deficit. He exhibits normal muscle tone. Coordination normal.  Skin: Skin is warm and dry.  Psychiatric: His behavior is normal. Judgment and thought content normal.       Affect appears flat.          Assessment & Plan:  1. HIV. From 01/16/2011, his CD4 count was 650 at 32% with a viral load less than is a 20 copies per mL. Clinically stable on Atripla. Recommend continuing present management with a followup in 3 months and fasting labs 2 weeks before appointment. Verbally acknowledged this information and agreed with plan of care.  2. URI. Most likely viral in nature. However, we will recommend that he continue taking the antibiotics until they are gone. Verbally acknowledged this information.  3. Health Maintenance. Requesting information regarding his PSA, which was 0.94. We informed him that this was normal and that we would monitor this on an annual basis. Verbally acknowledged this information.

## 2011-03-20 NOTE — Assessment & Plan Note (Signed)
NAMEAVYAAN, SUMMER              CHART#:  [id number]   DATE:  12/31/2007                       DOB:  Nov 23, 1955   CHIEF COMPLAINT:  Abdominal pain.   SUBJECTIVE:  The patient is here for a followup visit.  He missed his  appointment in January.  In the interim, we received a request for  screening colonoscopy by Dr. Yisroel Ramming with infectious disease  clinic.  Apparently since we last saw the patient in January 2008, he  has been found to have HIV.  He is undergoing treatment by Dr. Yisroel Ramming.  It is notable that the patient has already had a colonoscopy  fairly recently back in August 2007.  He had left-sided diverticulum but  otherwise normal.  He has no family history or personal history of  colorectal cancer or polyps.  He presents with his son who provides  interpretation.  The patient states he has been feeling fairly well.  He  is tolerating antiviral therapy.  His reflux is well-controlled.  His  bowel movements are very regular, usually 2-3 times a day of soft stool.  He denies any blood in the stool or melena.  Denies any fever or chills,  chest pain, cough, shortness of breath.  The only complaint is that 8  days ago, he started noticing some discomfort in the right lower  quadrant region.  He describes it as pins and needles.  It is not severe  pain.  He notes it to be worse when he does heavy lifting and at that  point, it does radiate down into his groin.  He is concerned he has a  hernia.  Denies any dysuria or hematuria.   CURRENT MEDICATIONS:  See updated list.   ALLERGIES:  No known drug allergies.   PHYSICAL EXAM:  Weight 179, temp 98, blood pressure 122/82, pulse 80.  GENERAL:  Pleasant, well-nourished, well-developed Hispanic male in no  acute distress.  HEENT:  Sclerae nonicteric.  SKIN: Warm and dry, no jaundice.  CHEST:  Lungs clear to auscultation.  CARDIAC:  Regular rate and rhythm.  Normal S1 and S2.  No murmurs, rubs,  or gallops.  ABDOMEN:  Positive bowel sounds.  Abdomen soft, nondistended, nontender.  No organomegaly or masses, no rebound or guarding, no abdominal bruits  or hernias noted.  LOWER EXTREMITIES:  No edema.   IMPRESSION:  The patient is a 55 year old Hispanic male with history of  human immunodeficiency virus who presents for followup.  We have seen  him previously for chronic gastroesophageal reflux disease and right  flank pain of unclear etiology.  This pain actually resolved.  He now  presents with an 8 day history of vague pins and needle type pain down  in the right lower quadrant radiating to his groin when he lifts heavy  objects.  I would be concerned about the possibility of inguinal hernia.  He has low likelihood of other etiologies such as colitis, appendicitis,  or other intra-abdominal process based on exam.  Given his human  immunodeficiency virus status, he would be at increased risk for  opportunistic infections if his CD4 count was significantly low, but the  last one that I saw on records from Dr. Philipp Deputy showed a level of 700 in  January 2009.  At any rate, we will recommend the patient  watch for  worsening pain, change in bowel movements, fever, or chills.  If any of  these were to develop, he should notify the doctors immediately.  Otherwise, I would like for him to go see his primary care and be  checked for possible inguinal hernia.  With regard to screening  colonoscopy, the patient has already had one back in August 2007 and, at  this point in time, there is no indication that he needs a repeat  colonoscopy.   PLAN:  1. I would like him to follow up with his primary care to have      examination for inguinal hernia.  2. He has been advised of warning symptoms as outlined above, at which      time he should notify us or Dr. Philipp Deputy or Dr. Wende Crease immediately.  3. No need for a repeat colonoscopy at this time.  Previous      colonoscopy as outlined above unremarkable  except for diverticula      less than 2 years ago.  4. Office visit as needed.       Tana Coast, P.A.  Electronically Signed     R. Roetta Sessions, M.D.  Electronically Signed    LL/MEDQ  D:  12/31/2007  T:  01/01/2008  Job:  [id number]   cc:   Laverle Hobby, M.D.  Pablo Lawrence. Philipp Deputy, M.D.

## 2011-03-20 NOTE — Assessment & Plan Note (Signed)
NAMEMILBERN, DOESCHER                CHART#:  [id number]   DATE:  10/15/2008                       DOB:  07/28/1956   FOLLOWUP:  Gastroesophageal reflux disease.   We saw him on December 31, 2007, with abdominal pain, history of HIV,  gastroesophageal reflux disease.  He had history of some vague right  lower quadrant abdominal pain radiating into his groin, those symptoms  have resolved.  Since we saw him, he had been on Aciphex with really  good control of his reflux symptoms, but it has come off the edge along  the way.  He is followed by Dr. Philipp Deputy, Infectious Disease, down in  [place].  Note, his last CD4 count is noted to be 700.  He is on  Atripla at bedtime.  He is currently on no acid suppression therapy.  His son accompanies him today and provides interpretation.  Apparently,  he is having quite a bit of typical regurgitation and reflux symptoms.  No odynophagia.  No dysphagia.  No melena or rectal bleeding.  It is  notable back on July 02, 2006, he underwent EGD which revealed a  normal upper GI tract, colonoscopy demonstrated left-sided diverticula.  The remainder of the rectum and colon appeared normal.  He is due for  routine screening in 2017.   CURRENT MEDICATIONS:  Atripla 1 at bedtime.   ALLERGIES:  No known drug allergies.   PHYSICAL EXAMINATION:  GENERAL:  Today, he is accompanied by his son.  He appears in no acute distress.  VITAL SIGNS:  Weight 180, which is up 1 pound; height 5 feet 6 inches;  temperature 97.8; BP 118/70; and pulse 72.  CHEST:  Lungs are clear to auscultation.  CARDIAC:  Regular rate and rhythm without murmur, gallop, or rub.  ABDOMEN:  Nondistended, positive bowel sounds.  Soft and nontender  without appreciable mass or organomegaly.  SKIN:  He has multiple chest and abdominal tattoos.   ASSESSMENT:  Regurgitation, pyrosis most consistent with  gastroesophageal reflux disease.  He is not on any acid suppression  therapy.  I do not  detect any alarm features.  Good to know he had a EGD  just over 2 years ago.   RECOMMENDATIONS:  She really needs to get back on acid suppression  therapy.  He was taking Aciphex with Atripla previously.  We do not  think there is going to be any problems with reinstituting Aciphex, but  I will do a medication check just to make sure there is no problems with  acid suppression therapy and Atripla.  We will provide him with anti-  reflux literature.  Unless  something comes up, we will allow him to stay on Aciphex chronically.  I  will plan to see him back in 1 year.       Jonathon Bellows, M.D.  Electronically Signed     RMR/MEDQ  D:  10/15/2008  T:  10/16/2008  Job:  [id number]   cc:   Tresa Endo L. Philipp Deputy, M.D.

## 2011-03-23 NOTE — Consult Note (Signed)
[name], [name] NO.:  [id number]   MEDICAL RECORD NO.:  [id number]          PATIENT TYPE:  EMS   LOCATION:  ED                            FACILITY:  [facility]   PHYSICIAN:  R. Roetta Sessions, M.D. DATE OF BIRTH:  [birth year]   DATE OF CONSULTATION:  06/21/2006  DATE OF DISCHARGE:                                   CONSULTATION   GASTROENTEROLOGY CONSULTATION   REASON FOR CONSULTATION:  Reflux, altered bowel function.   HISTORY OF PRESENT ILLNESS:  [name] is a 55 year old non-  English-speaking Hispanic gentleman referred courtesy of Dr. Holley Bouche.  He is accompanied by his son who serves as the interpreter.  Apparently Mr.  Luis Gomez has had typical reflux symptoms he describes as heartburn for a good  20 years.  He used to consume quite a bit of beer on the weekends and had  worsening reflux symptoms than he has actually has now.  He reports two to  three episodes of reflux/indigestion weekly now.  He has not had any  odynophagia or dysphagia. He has been on various dosing's of ranitidine and  Prilosec with variable improvement in symptoms.  He has intermittent  diarrhea.  He has not had any melena or rectal bleeding.  He has never had  his lower gastrointestinal tract imaged. He, by interpreter, has not had any  upper GI tract studies.  He does not use tobacco products. He does not use  nonsteroidal's.  There is no history of peptic ulcer disease.  There is no  family history of gastrointestinal disease including inflammatory bowel  disease or colorectal neoplasia.   PAST MEDICAL HISTORY:  Apparently unremarkable for chronic illnesses.  He  has had no surgeries per his report.   CURRENT MEDICATIONS:  1. Omeprazole 20 mg daily.  2. Ranitidine 150 mg orally b.i.d.   ALLERGIES:  No known drug allergies.   FAMILY HISTORY:  Mother succumbed to some type of lung problem at age 26.  Father also reportedly passed away related to smoking-related  problems at  age 27.  No history of chronic gastrointestinal or liver illness.   SOCIAL HISTORY:  The patient is married.  He has four children.  He works  for [employer] here in [place].  No tobacco or illicit drugs.   REVIEW OF SYSTEMS:  No obvious chest pain, dyspnea on exertion, no change in  weight.  No fever or chills.  Generally moves his bowels once daily but  occasionally has some loose, nonbloody bowel movements.  No obvious melena  or rectal bleeding.   PHYSICAL EXAMINATION:  GENERAL APPEARANCE:  Physical examination today  reveals a pleasant 55 year old non-English-speaking Hispanic male  accompanied by his son.  VITAL SIGNS:  Weight 176.5.  Height 5 feet 6 inches.  Temperature 98.5.  Blood pressure 112/70. Pulse 68.  SKIN:  Warm and dry.  There is no jaundice.  No cutaneous stigmata of  chronic liver disease.  He has a large religious tattoo on his  chest/abdomen.  HEENT:  No scleral icterus.  Conjunctivae are pink.  Oral cavity with no  lesions.  Dentition is fair state of repair.  CHEST:  Lungs are clear to auscultation.  CARDIAC:  Examination shows regular rate and rhythm without murmurs, rubs or  gallops.  ABDOMEN:  Nondistended.  Positive bowel sounds.  Soft, nontender without  appreciable masses or organomegaly.  EXTREMITIES:  Have no edema.  RECTAL:  Exam is deferred to the time of colonoscopy.   IMPRESSION:  [name] is a 55 year old gentleman with at least a  two decade history of gastroesophageal reflux disease symptoms.  I do not  detect any long features of long-standing reflux symptoms, however, demands  further evaluation to assess him for complicated gastroesophageal reflux  disease.  He has intermittent episodes of diarrhea and a background of  otherwise normal bowel function; symptoms are quite nonspecific.  He is at  the threshold age for colorectal cancer screening.   RECOMMENDATIONS:  I have offered [name] both an EGD and  screening  colonoscopy.  The potential risks, benefits and alternatives have been  reviewed via his son, who serves as the interpreter.  Questions were  answered and he is agreeable.  In the interim, will go ahead and stop his  ranitidine and omeprazole and start him on some AcipHex 20 mg orally daily.  I have given him #30 samples. He is to take one each morning before  breakfast. Will make further recommendations in the very near future.   I would like to thank Dr. Holley Bouche for allowing me to see this nice  gentleman today.      Jonathon Bellows, M.D.  Electronically Signed     RMR/MEDQ  D:  06/21/2006  T:  06/21/2006  Job:  [id number]   cc:   Laverle Hobby, M.D.  [address]  [place], [place] [address]

## 2011-03-23 NOTE — Op Note (Signed)
Luis Gomez, Luis Gomez             ACCOUNT NO.:  [id number]   MEDICAL RECORD NO.:  [id number]          PATIENT TYPE:  AMB   LOCATION:  DAY                           FACILITY:  [facility]   PHYSICIAN:  R. Roetta Sessions, M.D. DATE OF BIRTH:  [birth year]   DATE OF PROCEDURE:  DATE OF DISCHARGE:                                 OPERATIVE REPORT   PROCEDURE:  EGD diagnostic followed by colonoscopy screening.   INDICATIONS FOR PROCEDURE:  The patient is a 54 year old non English  speaking Hispanic with worsening reflux symptoms taking Omeprazole and  Ranitidine. We switched him from Ranitidine and Omeprazole and in the office  recently to AcipHex 20 mg daily, samples were given. However based on the  medication reconciliation sheet today he is still taking Ranitidine and  Omeprazole. This approach has been discussed with the patient previously.  The potential risks, benefits, and alternatives have been reviewed,  questions answered.  He is agreeable. Please see the documentation in the  medical record.   MONITORING:  O2 saturation, blood pressure, pulse, and respirations were  monitored throughout the entire procedure.   CONSCIOUS SEDATION:  Versed 4 mg IV, Demerol 100 mg IV in divided doses.   INSTRUMENTS:  Olympus video chip system.   FINDINGS EGD EXAMINATION:  The tubular esophagus revealed no mucosal  abnormalities. The EG junction was easily traversed.   STOMACH:  The gastric cavity was empty and insufflated well with air. A  thorough examination of the gastric mucosa including a retroflexed view of  the proximal stomach and esophagogastric junction demonstrated no  abnormalities. The pylorus was patent and easily traversed. Examination of  the bulb and second portion revealed no abnormalities.   THERAPEUTIC/DIAGNOSTIC MANEUVERS:  None.   The patient tolerated the procedure well and was prepared for colonoscopy.   PROCEDURE:  Colonoscopy.   Digital rectal exam revealed no  abnormalities.   ENDOSCOPIC FINDINGS:  The prep was adequate.   Examination of the colonic mucosa from the rectosigmoid junction to the left  transverse and right colon to the area of the appendiceal orifice and  ileocecal valve was undertaken. These structures were seen and photographed  for the record. From this level the scope was slowly withdrawn. All  previously mentioned mucosal surfaces were carefully inspected. The patient  had extensive left-sided diverticula. The rectal mucosa was inspected  closely including retroflexed view of the anal verge and no abnormalities  were seen.  This patient tolerated both procedures well and was reacted in  endoscopy.   IMPRESSION:  EGD:  Normal esophagus, stomach, D1, D2.   COLONOSCOPY FINDINGS:  Normal rectum, left-sided diverticulum. Colonic  mucosa appeared normal.   RECOMMENDATIONS:  GERD literature provided to Luis Gomez. He is to take  AcipHex 20 mg orally daily and will get an interpreter involved to make sure  this is what is going on as based on the med reconciliation sheet, he is  still taking what he ws prescribed previously before he came to see me. Plan  to see Luis Gomez back in the office in one month.      R.  Roetta Sessions, M.D.  Electronically Signed     RMR/MEDQ  D:  07/02/2006  T:  07/03/2006  Job:  [id number]

## 2011-04-03 ENCOUNTER — Other Ambulatory Visit: Payer: Self-pay | Admitting: *Deleted

## 2011-04-03 DIAGNOSIS — B20 Human immunodeficiency virus [HIV] disease: Secondary | ICD-10-CM

## 2011-04-03 MED ORDER — EFAVIRENZ-EMTRICITAB-TENOFOVIR 600-200-300 MG PO TABS: 1.0000 | ORAL_TABLET | Freq: Every day | ORAL | Status: DC

## 2011-04-03 NOTE — Telephone Encounter (Signed)
[facility] medication arrived, Atripla.  Pt notified.  Will come in tomorrow to pick up.  Jennet Maduro, RN

## 2011-05-03 ENCOUNTER — Telehealth: Payer: Self-pay | Admitting: Licensed Clinical Social Worker

## 2011-05-03 NOTE — Telephone Encounter (Signed)
Patient's medications arrived today and patient notified by voicemail.

## 2011-05-31 ENCOUNTER — Telehealth: Payer: Self-pay | Admitting: Licensed Clinical Social Worker

## 2011-05-31 NOTE — Telephone Encounter (Signed)
Patient assistant medications arrived today, left patient a message.

## 2011-06-01 ENCOUNTER — Ambulatory Visit (INDEPENDENT_AMBULATORY_CARE_PROVIDER_SITE_OTHER): Payer: 59 | Admitting: Urology

## 2011-06-01 ENCOUNTER — Other Ambulatory Visit: Payer: Self-pay | Admitting: Urology

## 2011-06-01 DIAGNOSIS — R310 Gross hematuria: Secondary | ICD-10-CM

## 2011-06-01 DIAGNOSIS — R300 Dysuria: Secondary | ICD-10-CM

## 2011-06-01 DIAGNOSIS — N4340 Spermatocele of epididymis, unspecified: Secondary | ICD-10-CM

## 2011-06-05 ENCOUNTER — Ambulatory Visit (HOSPITAL_COMMUNITY)
Admission: RE | Admit: 2011-06-05 | Discharge: 2011-06-05 | Disposition: A | Discharge status: Home / Self Care | Payer: 59 | Source: Ambulatory Visit | Attending: Urology | Admitting: Urology

## 2011-06-05 DIAGNOSIS — R1032 Left lower quadrant pain: Secondary | ICD-10-CM | POA: Insufficient documentation

## 2011-06-05 DIAGNOSIS — R300 Dysuria: Secondary | ICD-10-CM | POA: Insufficient documentation

## 2011-06-05 DIAGNOSIS — R319 Hematuria, unspecified: Secondary | ICD-10-CM | POA: Insufficient documentation

## 2011-06-05 MED ORDER — IOHEXOL 300 MG/ML  SOLN
125.0000 mL | Freq: Once | INTRAMUSCULAR | Status: AC | PRN
  Administered 2011-06-05: 125 mL via INTRAVENOUS

## 2011-06-07 ENCOUNTER — Other Ambulatory Visit: Payer: Self-pay | Admitting: Infectious Diseases

## 2011-06-07 ENCOUNTER — Other Ambulatory Visit (INDEPENDENT_AMBULATORY_CARE_PROVIDER_SITE_OTHER): Payer: 59

## 2011-06-07 DIAGNOSIS — Z79899 Other long term (current) drug therapy: Secondary | ICD-10-CM

## 2011-06-07 DIAGNOSIS — B20 Human immunodeficiency virus [HIV] disease: Secondary | ICD-10-CM

## 2011-06-07 LAB — CBC WITH DIFFERENTIAL/PLATELET
Basophils Relative: 1 % (ref 0–1)
HCT: 44.9 % (ref 39.0–52.0)
Hemoglobin: 14.2 g/dL (ref 13.0–17.0)
Lymphocytes Relative: 37 % (ref 12–46)
Lymphs Abs: 2.3 10*3/uL (ref 0.7–4.0)
Monocytes Absolute: 0.4 10*3/uL (ref 0.1–1.0)
Monocytes Relative: 7 % (ref 3–12)
Neutro Abs: 3 10*3/uL (ref 1.7–7.7)
Neutrophils Relative %: 49 % (ref 43–77)
RBC: 5.34 MIL/uL (ref 4.22–5.81)

## 2011-06-07 LAB — LIPID PANEL
LDL Cholesterol: 93 mg/dL (ref 0–99)
Triglycerides: 135 mg/dL (ref <150)
VLDL: 27 mg/dL (ref 0–40)

## 2011-06-08 LAB — COMPLETE METABOLIC PANEL WITH GFR
ALT: 18 U/L (ref 0–53)
AST: 16 U/L (ref 0–37)
Calcium: 9.3 mg/dL (ref 8.4–10.5)
Chloride: 103 mEq/L (ref 96–112)
Creat: 1.05 mg/dL (ref 0.50–1.35)
Potassium: 4.3 mEq/L (ref 3.5–5.3)
Sodium: 138 mEq/L (ref 135–145)
Total Protein: 6.8 g/dL (ref 6.0–8.3)

## 2011-06-08 LAB — T-HELPER CELL (CD4) - (RCID CLINIC ONLY): CD4 T Cell Abs: 730 uL (ref 400–2700)

## 2011-06-11 LAB — HIV-1 RNA QUANT-NO REFLEX-BLD
HIV 1 RNA Quant: <20 copies/mL (ref <20)
HIV-1 RNA Quant, Log: <1.3 {Log} (ref <1.30)

## 2011-06-21 ENCOUNTER — Ambulatory Visit: Payer: Self-pay | Admitting: Adult Health

## 2011-06-26 ENCOUNTER — Encounter: Payer: Self-pay | Admitting: Adult Health

## 2011-06-26 ENCOUNTER — Other Ambulatory Visit: Payer: Self-pay | Admitting: Infectious Diseases

## 2011-06-26 ENCOUNTER — Ambulatory Visit (INDEPENDENT_AMBULATORY_CARE_PROVIDER_SITE_OTHER): Payer: 59 | Admitting: Adult Health

## 2011-06-26 VITALS — BP 126/79 | HR 83 | Temp 98.1°F | Ht 66.0 in | Wt 181.0 lb

## 2011-06-26 DIAGNOSIS — B20 Human immunodeficiency virus [HIV] disease: Secondary | ICD-10-CM

## 2011-06-26 DIAGNOSIS — E119 Type 2 diabetes mellitus without complications: Secondary | ICD-10-CM

## 2011-06-26 DIAGNOSIS — Z23 Encounter for immunization: Secondary | ICD-10-CM

## 2011-06-26 NOTE — Telephone Encounter (Signed)
rec'd a fax fpr Atripla refill. He gets thru pap program. Reviewed red chart. He does not have ADAP & his insurance is refusing to pay. He has reached benefit limit. Gave to [place] to review & advise.

## 2011-06-26 NOTE — Telephone Encounter (Signed)
Per Britta Mccreedy, he will need a letter from his insurance company stating that they will not pay for any more meds. He has an appt with Britta Mccreedy later this month to submit the letter & other paperwork to see if he can get the Atripla

## 2011-06-29 ENCOUNTER — Ambulatory Visit (INDEPENDENT_AMBULATORY_CARE_PROVIDER_SITE_OTHER): Payer: 59 | Admitting: Urology

## 2011-06-29 DIAGNOSIS — R300 Dysuria: Secondary | ICD-10-CM

## 2011-06-29 DIAGNOSIS — R310 Gross hematuria: Secondary | ICD-10-CM

## 2011-07-06 ENCOUNTER — Ambulatory Visit: Payer: 59

## 2011-07-13 ENCOUNTER — Ambulatory Visit: Payer: 59

## 2011-07-13 ENCOUNTER — Other Ambulatory Visit: Payer: Self-pay | Admitting: *Deleted

## 2011-07-13 DIAGNOSIS — B20 Human immunodeficiency virus [HIV] disease: Secondary | ICD-10-CM

## 2011-07-13 MED ORDER — EFAVIRENZ-EMTRICITAB-TENOFOVIR 600-200-300 MG PO TABS: 1.0000 | ORAL_TABLET | Freq: Every day | ORAL | Status: DC

## 2011-07-20 ENCOUNTER — Encounter: Payer: Self-pay | Admitting: Internal Medicine

## 2011-07-26 LAB — T-HELPER CELL (CD4) - (RCID CLINIC ONLY): CD4 T Cell Abs: 700

## 2011-08-03 ENCOUNTER — Telehealth: Payer: Self-pay

## 2011-08-03 NOTE — Telephone Encounter (Signed)
PATIENT WAS DENIED FOR ADAP DUE TO INCOME AND INSURANCE - ONCE AUTHORIZATION LETTER IS RECEIVED, WILL ASK SALLY OR KARIE TO PUT ON PATIENT ASST.

## 2011-08-13 ENCOUNTER — Telehealth: Payer: Self-pay | Admitting: Infectious Disease

## 2011-08-13 ENCOUNTER — Other Ambulatory Visit: Payer: Self-pay | Admitting: Infectious Diseases

## 2011-08-13 NOTE — Telephone Encounter (Signed)
Called [facility] talked with rep to see if patient qualified for meds.  Then discussed with patient about making appointment.  Arranged for him to come in 10-10 at 2:00pm and to bring insurance card.

## 2011-08-14 ENCOUNTER — Telehealth: Payer: Self-pay | Admitting: *Deleted

## 2011-08-14 DIAGNOSIS — B20 Human immunodeficiency virus [HIV] disease: Secondary | ICD-10-CM

## 2011-08-14 MED ORDER — EFAVIRENZ-EMTRICITAB-TENOFOVIR 600-200-300 MG PO TABS: 1.0000 | ORAL_TABLET | Freq: Every day | ORAL | Status: DC

## 2011-08-15 NOTE — Telephone Encounter (Signed)
Pt did come in.  He brought a copy of his latest pay stub.  He did get his emergency meds.  He signed his application and will be mailed today 08-15-11.  Check status in 1 week

## 2011-08-20 NOTE — Telephone Encounter (Signed)
Called [facility] - stated they have not received the application yet.  Will check back in one week.

## 2011-08-22 LAB — T-HELPER CELL (CD4) - (RCID CLINIC ONLY): CD4 % Helper T Cell: 20 — ABNORMAL LOW

## 2011-08-22 NOTE — Telephone Encounter (Signed)
Called [employer] Patient Assistance Program.  Application has not been received.  Re-faxed to [phone].  Will check to see if received on Monday, 10-22.

## 2011-08-23 NOTE — Telephone Encounter (Signed)
Received fax from [employer].  Need more info from patient.  Called spoke with Mr Carolin Coy.  He will come by tomorrow with his latest pay stub.  Will copy and send to [facility].

## 2011-08-27 NOTE — Telephone Encounter (Signed)
Mr. Carolin Coy brought the needed information by.  Re-faxed to [employer] today.  Will check tomorrow to see if received.

## 2011-08-28 NOTE — Telephone Encounter (Signed)
Mr. Marcina Millard son called.  Mr. Carolin Coy will get his pay stub with check attached notarized and bring it to the office so we can make a copy and send to [facility]. I told him if he comes on Friday to ask for [facility]

## 2011-08-29 ENCOUNTER — Telehealth: Payer: Self-pay | Admitting: Infectious Disease

## 2011-08-29 NOTE — Telephone Encounter (Signed)
Received call from Crystal with [employer] Patient Assistance Program.  He has been approved for 90 days.  He can re-apply with ADAP after January 1 to see if he qualifies then.   Called patient, spoke with his son. Told him his Atripla has been approved for 90 days.  Will call back tomorrow to get ID number.

## 2011-08-30 NOTE — Telephone Encounter (Signed)
Called [facility] to get Mr Luis Gomez  ID number for his medication.   ID # [id number]  Group [id number]  Bin # [id number].  Calling patient this information.

## 2011-09-05 ENCOUNTER — Encounter: Payer: Self-pay | Admitting: Urgent Care

## 2011-09-05 ENCOUNTER — Ambulatory Visit (INDEPENDENT_AMBULATORY_CARE_PROVIDER_SITE_OTHER): Payer: 59 | Admitting: Urgent Care

## 2011-09-05 VITALS — BP 114/72 | HR 88 | Temp 97.9°F | Ht 66.0 in | Wt 177.2 lb

## 2011-09-05 DIAGNOSIS — K219 Gastro-esophageal reflux disease without esophagitis: Secondary | ICD-10-CM

## 2011-09-05 MED ORDER — LANSOPRAZOLE 15 MG PO CPDR: 15.0000 mg | DELAYED_RELEASE_CAPSULE | Freq: Every day | ORAL | Status: DC

## 2011-09-05 NOTE — Patient Instructions (Signed)
Office visit 1 yr w/ Dr Jena Gauss

## 2011-09-05 NOTE — Progress Notes (Signed)
Primary Care Physician:  Justin Mend Primary Gastroenterologist:  Dr. Bearl Mulberry interpreter [phone]  Chief Complaint  Patient presents with  . Follow-up    burning and hurting in stomach   HPI:  Luis Gomez is a 55 y.o. male here for follow up for GERD.  Doing ok.  "I finished medication 1 month ago."  With Spicy hot foods, the pain started all over again.  Had previously been taking Prevacid 15mg  daily.  Denies dysphagia or odynophagia.  Appetite ok. Wt stable.  Denies abdominal pain.  BM ok.   Denies rectal bleeding or melena.  Past Medical History  Diagnosis Date  . GERD (gastroesophageal reflux disease)     Last EGD->07/02/06 normal  . HIV disease   . S/P colonoscopy 07/02/2006    Left sided diverticula  . Diverticulosis    Past Surgical History  Procedure Date  . Back surgery    Current Outpatient Prescriptions  Medication Sig Dispense Refill  . efavirenz-emtrictabine-tenofovir (ATRIPLA) 600-200-300 MG per tablet Take 1 tablet by mouth daily.  90 tablet  3  . lansoprazole (PREVACID) 15 MG capsule Take 1 capsule (15 mg total) by mouth daily.  31 capsule  11  . efavirenz-emtrictabine-tenofovir (ATRIPLA) 600-200-300 MG per tablet Take 1 tablet by mouth at bedtime.  30 tablet  prn   Allergies as of 09/05/2011  . (No Known Allergies)   Family history:There is no known family history of colorectal carcinoma , liver disease, or inflammatory bowel disease.  History   Social History  . Marital Status: Married    Spouse Name: N/A    Number of Children: 4  . Years of Education: N/A   Occupational History  . [employer]    Social History Main Topics  . Smoking status: Never Smoker   . Smokeless tobacco: Never Used  . Alcohol Use: No  . Drug Use: No  . Sexually Active: Not Currently     pt. given condoms   Other Topics Concern  . Not on file   Social History Narrative  . No narrative on file   Review of Systems: Gen: Denies any fever, chills, sweats, anorexia,  fatigue, weakness, malaise, weight loss, and sleep disorder CV: Denies chest pain, angina, palpitations, syncope, orthopnea, PND, peripheral edema, and claudication. Resp: Denies dyspnea at rest, dyspnea with exercise, cough, sputum, wheezing, coughing up blood, and pleurisy. GI: Denies vomiting blood, jaundice, and fecal incontinence.   Denies dysphagia or odynophagia. Derm: Denies rash, itching, dry skin, hives, moles, warts, or unhealing ulcers.  Psych: Denies depression, anxiety, memory loss, suicidal ideation, hallucinations, paranoia, and confusion. Heme: Denies bruising, bleeding, and enlarged lymph nodes.  Physical Exam: BP 114/72  Pulse 88  Temp(Src) 97.9 F (36.6 C) (Temporal)  Ht 5\' 6"  (1.676 m)  Wt 177 lb 3.2 oz (80.377 kg)  BMI 28.60 kg/m2 General:   Alert,  Well-developed, well-nourished, pleasant and cooperative in NAD Head:  Normocephalic and atraumatic. Eyes:  Sclera clear, no icterus.   Conjunctiva pink. Mouth:  No deformity or lesions, dentition normal. Neck:  Supple; no masses or thyromegaly. Heart:  Regular rate and rhythm; no murmurs, clicks, rubs,  or gallops. Abdomen:  Soft, nontender and nondistended. No masses, hepatosplenomegaly or hernias noted. Normal bowel sounds, without guarding, and without rebound.   Msk:  Symmetrical without gross deformities. Normal posture. Pulses:  Normal pulses noted. Extremities:  Without clubbing or edema. Neurologic:  Alert and  oriented x4;  grossly normal neurologically. Skin:  Intact without significant lesions or  rashes. Cervical Nodes:  No significant cervical adenopathy. Psych:  Alert and cooperative. Normal mood and affect.

## 2011-09-05 NOTE — Assessment & Plan Note (Signed)
Previously well controlled on Prevacid 15 mg daily. He has ran out of his medication. Will resume. He is to call if no relief. Otherwise doing well.

## 2011-09-05 NOTE — Progress Notes (Signed)
Cc to PCP 

## 2011-09-17 ENCOUNTER — Encounter: Payer: Self-pay | Admitting: Internal Medicine

## 2011-09-18 ENCOUNTER — Ambulatory Visit (INDEPENDENT_AMBULATORY_CARE_PROVIDER_SITE_OTHER): Payer: 59 | Admitting: Gastroenterology

## 2011-09-18 ENCOUNTER — Encounter: Payer: Self-pay | Admitting: Gastroenterology

## 2011-09-18 DIAGNOSIS — R109 Unspecified abdominal pain: Secondary | ICD-10-CM

## 2011-09-18 DIAGNOSIS — K921 Melena: Secondary | ICD-10-CM

## 2011-09-18 MED ORDER — ALIGN 4 MG PO CAPS: 1.0000 | ORAL_CAPSULE | ORAL | Status: DC

## 2011-09-18 MED ORDER — HYDROCORTISONE 2.5 % RE CREA: TOPICAL_CREAM | RECTAL | Status: AC

## 2011-09-18 NOTE — Patient Instructions (Signed)
Start taking a Probiotic daily. We have given you samples of Align.   Try to follow a high fiber diet and avoid foods that have dairy products in them. We have given you a list as well.  Use the cream twice a day for 10 days to help with itching and burning. Place at rectum.   We will see you back in 6 weeks. If you still have blood, we may need to do a colonoscopy.   Dieta libre de lactosa (Lactose-Free Diet) La lactosa es un carbohidrato que se encuentra principalmente en la [place]ler y los productos lcteos, como tambin en alimentos con [place] y suero agregados. Para que la lactosa pueda ser [place] por el cuerpo, debe ser digerida por una enzima. La intolerancia a la lactosa ocurre cuando hay una escasez de [place]. Cuando su cuerpo no puede digerir la lactosa, puede sentir nuseas, hinchazn, calambres, gases y [place]. TIPOS DE DEFICIENCIA DE LACTASA  Deficiencia de lactasa primaria. ste es el tipo ms comn. Se caracteriza por una reduccin lenta de la actividad de la lactasa.     Deficiencia de Altamease Oiler. Esto ocurre luego de una lesin en la mucosa del intestino delgado como consecuencia de una enfermedad. Tambin puede producirse luego de [place] o de un tratamiento con antibiticos o medicamentos para Animator.  La tolerancia a la lactosa vara ampliamente, y cada persona debe determinar cunta cantidad de [place] puede consumir para no desarrollar sntomas. Beber pequeas porciones de [employer] puede ser de [place]. Algunos estudios sugieren que retardar el vaciamiento gstrico puede ayudar a aumentar la tolerancia a productos lcteos. Esto puede realizarse mediante:  El consumo de [place] o productos lcteos acompaado de otros alimentos, Teacher, English as a foreign language de consumirlos solos.     Consumir leche con un mayor contenido graso.  Existen muchos productos lcteos que International aid/development worker pueden tolerar mejor que la leche:  El queso (especialmente queso aejo) - el  contenido de lactosa es mucho menor que en la [place].     El consumo de productos lcteos cultivados, como yogur, suero de [place], requesn, y [place] de [address] (kfir) normalmente es bien tolerado por individuos con deficiencia de Building control surveyor. Esto ocurre porque las bacterias ayudan a Therapist, nutritional.     La leche con lactosa hidrolizada contiene un 40-90% menos de lactosa que la [place] y tambin puede ser [place].  REQUERIMIENTOS Estas dietas pueden ser deficientes en calcio, riboflavina, y vitamina D, segn los Recommended Dietary Allowances (cantidades recomendadas en la dieta) del [employer]il [employer] ([employer] de [place]). Es posible que se puedan PepsiCo recomendados, esto depende de la [place] individual y el consumo de sustitutos de [place], [place], u otros productos lcteos. NOTAS ESPECIALES  La lactosa es un carbohidrato. La principal fuente de alimento son los productos lcteos. Es Secondary school teacher los rtulos de los alimentos. Muchos productos contienen lactosa aunque no hayan sido hechos a partir de [employer]. Busque las siguientes palabras: Suero, slidos lcteos, slidos lcteos deshidratados, polvo de [place] sin grasa. Entre las fuentes comunes de lactosa adems de los productos lcteos se incluyen panes, caramelos, embutidos, alimentos preparados y procesados, y salsas y Financial controller.     Todos los alimentos deben prepararse sin [place], crema u otros productos lcteos.     Puede ser necesario un suplemento de vitaminas o minerales. Consulte con su mdico o nutricionista registrado.     La lactosa tambin se encuentra en muchos medicamentos de prescripcin o de venta  libre.     Puede consumir [place] de soja y suplementos libres de lactosa como alternativa a la [place].  ELECCIN DE LOS ALIMENTOS Panes/Fculas  Permitidos:  Panes y bollos hechos sin leche. Pan francs, de [place] Vera, [place]. Galletas de soda, galletas graham. Cualquier galleta  preparada sin lactosa. Cereales cocidos o deshidratados preparados sin lactosa (vea el rtulo). Patatas, pastas, o arroz, preparados sin leche o [place]. Palomitas de maz.     Evite:  Panes y bollos hechos que [employer]oco[employer]ill[employer]. Mezclas preparadas como pasteles, bizcochos, buuelos, panqueques. Rosquillas dulces, donas, tostada francesa (si contiene [place]). Bizcochos tostados, o cualquiera galletas que [employer]. Cereales cocidos o deshidratados preparados con lactosa (vea el rtulo). Pur de papas instantneo, papas fritas congeladas, papas festoneadas o gratinadas.  Vegatales  Permitidos:  Programmer, systems, congelados o enlatados.     Evite:  Vegetales con crema o rebozados. Vegetales en salsa de queso o con margarinas que contengan lactosa.  Frutas  Permitidos:  Nils Pyle frescas, enlatadas o congeladas que no estn procesadas con lactosa.     Evite:  Frutas enlatadas o congeladas que hayan sido procesadas con lactosa.  Carnes y sustitutos  Permitidos:  Bife, pollo, pescado, pavo, cordero, ternero, cerdo o jamn. Productos preparados con carne. Alimentos crnicos preparados para bebs que no contengan leche. Huevos, soja, frutos secos.     Evite:  Huevos revueltos, omelettes y souffles que [employer]oco[employer]illips. Scrambled eggs, omelets, and souffles that contain milk. Carne, pescado o aves de corral con crema o empanadas. Salchichas de viena, leverwurst o fiambres que contengan slidos de [place]. Queso, queso cottage o queso untable.  Leche  Permitidos:  Careers information officer.     Evite:  Leche (entera, al  2%, descremada o chocolatada). Evaporada, en polvo o condensada. Leche malteada.  Sopas y alimentos combinados  Permitidos:  Sopa, caldo, sopa de verduras, consoms. Sopas preparadas en casa con los alimentos permitidos. Alimentos combinados o preparados que no contengan leche ni productos lcteos (lea las etiquetas).     Evite:  Sopas crema, en latas. Sopas comerciales que contengan  lactosa Macaroni con queso, pizza. Alimentos combinados o preparados que contengan leche o productos lcteos.  Postres y dulces  Permitidos:  Helados de agua y de fruta, gelatina, torta ngel. Galletitas, tortas, pasteles caseros preparados con los ingredientes permitidos. Budn (preparado con agua o sustituto de [place]). Postres de tofu sin lactosa. Azcar, miel, jarabe de maz, mermelada, gelatina, dulces, melaza (azcar de caa). Caramelos de azcar, marshmallows.     Evite:  Helados de crema, sorbetes, flan, budn, yougur helado. Mezclas comerciales para preparar tortas y galletitas. Postres que contengan chocolate. Masa para pastel que Clinical research associate, postres reducidos en caloras preparados con sustitutos del azcar que contangan lactosa. Caramelos toffee, de menta, caramelos duros, chocolate.  Grases y aceites  Permitidos:  Occupational hygienist, (segn la tolerancia; contiene muy pequea cantidad de [place]). Margarinas y [employer] no contengan [place], aceites [place], [place], [place], crema artificial y coberturas sin lactosa ni slidos de Physiological scientist. [place].     Evite:  Margarinas y aderezos para [address] que contengan [place]; [place], [place] de man con slidos de [place] agregados, crema agria, bocaditos preparados con crema agria.  Bebidas  Permitidos:  Bebidas carbonatadas, t, caf, y caf soluble Carbonated drinks, tea, caf y caf soluble, algunos cafs instantneos (verifique las etiquetas). Bebidas frutales, jugos de frutas y de [address], [place] de arroz o de soja.     Evite: Chocolate caliente. Algunos cacaos, algunos cafs instantneos, ts  instantneos, jugos en polvo (lea las etiquetas).  Condimentos  Permitidos:  Salsa de soja, polvo de algarroba, [place], salsa preparada con agua, cacao, condimentos y especias, glutamato monosdico, catsup, mostaza.     Evite:  Algunas gomas de mascar, chocolate, algunos cacaos. Ciertos antibiticos y preparados de vitaminas  y minerales. Condimentos que contengan productos lcteos. Endulzantes artificieles que contengan lactosa. Algunas cremas no lcteas (lea las etiquetas).  EJEMPLO DE MEN Desayuno   Jugo de naranjas     Pltano    Bran flakes     Desnatador no lcteo     Pan de Vien[place] (brind)     [place] o Psychiatrist y Mining engineer que no Therapist, sports     T o caf  [place]  Doctor, hospital de pollo     Arroz    Habichuelas    [place] o Psychiatrist y aderezos que no contengan leche     Meln fresco     T o caf  Cena  Ase carne de vaca     Papa asada     [place] o margarinas y aderezos que no contengan leche     Brcol    Ensalada de Company secretary con el vinagre y el petrleo que visten     Bizcocho de alimento de [place]     T [place]  Document Released: 10/22/2005 Document Revised: 07/04/2011 [facility] Patient Information 2012 [place], [place].Hinchazn o distensin abdominal   (Bloating) La distensin abdominal es la sensacin de plenitud del abdomen. Puede ser que sienta que sus pantalones estn muy ajustados. A menudo la causa de la hinchazn es comer demasiado, retener lquidos o tener gases en el intestino. Tambin est ocasionado por tragar aire y comer alimentos que causan gases. El sndrome del colon irritable es una de una de las casuas ms comunes de la hinchazn. La constipacin tambin es una causa comn. A veces esto puede estar ocasionado por problemas ms serios. SNTOMAS Suele haber una sensacin de plenitud, y su abdomen salido para afuera. Puede haber molestias leves.   DIAGNSTICO En la mayora de los casos de distensin abdominal no suele realizarse ningn anlisis en particular. Si la enfermedad persiste y Advertising account executive, el mdico podr realizarle una prueba adicional.   TRATAMIENTO  No existe un tratamiento directo para la hinchazn.     No haga que el intestino se llene de gas. Puede ayudar a que esto no suceda. Evite mascar chicle y comer caramelos. Con esto se  tiende a Psychologist, sport and exercise. El tragar aire tambin puede ser un hbito nervioso. Trate de evitarlo.     Evitar dietas altas en residuos tambin ayudar. Consuma alimentos con fibras solubles y sustituya los productos lcteos con soja y Harlon Ditty. Esto ayuda para el sndrome de colon irritable.     Si la causa es la constipacin, una dieta alta en residuos con ms fibra ayudar.     Evite las bebidas carbonatadas.     Los preparados de venta libre tambin le ayudarn a reducir gases. El farmacutico lo ayudar.  SOLICITE ATENCIN MDICA SI:  La distensin contina y Advertising account executive.     Nota que aumenta de [place].     Tiene prdida de peso pero la hinchazn empeora.     Tiene cambios en sus hbitos al ir de cuerpo, o siente nuseas o vmitos.  SOLICITE ATENCIN MDICA DE INMEDIATO SI:  Presenta dificultades para respirar o hinchazn en las piernas.     Siente un aumento del dolor abdominal o dolor en el pecho.  Document Released: 01/18/2009 Document Revised: 07/04/2011 [facility] Patient Information 2012 [place], [place].

## 2011-09-19 NOTE — Progress Notes (Signed)
Primary Care Physician:  Justin Mend Primary Gastroenterologist: Dr. Jena Gauss  Chief Complaint  Patient presents with  . Abdominal Pain    burns sometimes    HPI:   [name] returns today after recently being seen the end of October due to GERD. He was doing well at that time. An interpreter is present due to limited [place]. He reports LLQ discomfort since around October 30th. He states he was started on Metamucil at that time. He reports intermittent burning, specifically when working, and the sensation of gas. He states he would bend down and then burp, which would relieve the discomfort. He denies any change in bowel habits, diarrhea, constipation. Intermittent paper hematochezia noted. Denies N/V. Reflux controlled. He stopped Metamucil 6 days ago, feels it is slightly better. Reports intermittent burning/itching rectally.  He is attempting to eat a healthy diet.   Past Medical History  Diagnosis Date  . GERD (gastroesophageal reflux disease)     Last EGD->07/02/06 normal  . HIV disease   . S/P colonoscopy 07/02/2006    Left sided diverticula  . Diverticulosis     Past Surgical History  Procedure Date  . Back surgery   . Esophagogastroduodenoscopy 07/03/2006    Normal esophagus, stomach, D1, D2.  . Colonoscopy 07/03/2006    Normal rectum, left-sided diverticulum    Current Outpatient Prescriptions  Medication Sig Dispense Refill  . efavirenz-emtrictabine-tenofovir (ATRIPLA) 600-200-300 MG per tablet Take 1 tablet by mouth daily.  90 tablet  3  . lansoprazole (PREVACID) 15 MG capsule Take 1 capsule (15 mg total) by mouth daily.  31 capsule  11  . efavirenz-emtrictabine-tenofovir (ATRIPLA) 600-200-300 MG per tablet Take 1 tablet by mouth at bedtime.  30 tablet  prn  . hydrocortisone (ANUSOL-HC) 2.5 % rectal cream Apply rectally 2 times daily for 10 days.  30 g  1  . Probiotic Product (ALIGN) 4 MG CAPS Take 1 capsule by mouth 1 day or 1 dose.  31 capsule  3    Allergies  as of 09/18/2011  . (No Known Allergies)    No family history on file.  History   Social History  . Marital Status: Married    Spouse Name: N/A    Number of Children: 4  . Years of Education: N/A   Occupational History  . Equity    Social History Main Topics  . Smoking status: Never Smoker   . Smokeless tobacco: Never Used  . Alcohol Use: No  . Drug Use: No  . Sexually Active: Not Currently     pt. given condoms   Other Topics Concern  . None   Social History Narrative  . None    Review of Systems: Gen: Denies fever, chills, anorexia. Denies fatigue, weakness, weight loss.  CV: Denies chest pain, palpitations, syncope, peripheral edema, and claudication. Resp: Denies dyspnea at rest, cough, wheezing, coughing up blood, and pleurisy. GI: SEE HPI Derm: Denies rash, itching, dry skin Psych: Denies depression, anxiety, memory loss, confusion. No homicidal or suicidal ideation.  Heme: Denies bruising, bleeding, and enlarged lymph nodes.  Physical Exam: BP 108/64  Pulse 80  Temp(Src) 98 F (36.7 C) (Temporal)  Ht 5\' 6"  (1.676 m)  Wt 176 lb (79.833 kg)  BMI 28.41 kg/m2 General:   Alert and oriented. No distress noted. Pleasant and cooperative.  Head:  Normocephalic and atraumatic. Eyes:  Conjuctiva clear without scleral icterus. Mouth:  Oral mucosa pink and moist. Good dentition. No lesions. Neck:  Supple, without mass or thyromegaly.  Heart:  S1, S2 present without murmurs, rubs, or gallops. Regular rate and rhythm. Abdomen:  +BS, soft, non-tender and non-distended. No rebound or guarding. No HSM or masses noted. Rectal: no external hemorrhoids noted, no erythema, internal exam benign, no discomfort noted, no gross blood noted. Msk:  Symmetrical without gross deformities. Normal posture. Extremities:  Without edema. Neurologic:  Alert and  oriented x4;  grossly normal neurologically. Skin:  Intact without significant lesions or rashes. Cervical Nodes:  No  significant cervical adenopathy. Psych:  Alert and cooperative. Normal mood and affect.

## 2011-09-20 DIAGNOSIS — K921 Melena: Secondary | ICD-10-CM | POA: Insufficient documentation

## 2011-09-20 DIAGNOSIS — R109 Unspecified abdominal pain: Secondary | ICD-10-CM | POA: Insufficient documentation

## 2011-09-20 NOTE — Assessment & Plan Note (Signed)
Intermittent paper hematochezia, mild amount. Rectal exam benign. Does report burning/itching rectally occasionally. Will treat with anusol cream, return in 6 weeks. If continued hematochezia or other concerning symptoms, may need to consider colonoscopy. Last performed in 2007.

## 2011-09-20 NOTE — Progress Notes (Signed)
Cc to PCP 

## 2011-09-20 NOTE — Assessment & Plan Note (Signed)
55 year old male with intermittent LLQ pain, specifically with movement/exertion at work, that began at the onset of starting Metamucil on Oct 30. He has since stopped this, with some improvement in symptoms. States he bends over to relieve pressure, burps, then feels better. No fever, chills, change in bowel habits. Question this new discomfort secondary to onset of Metamucil, specifically as he has noted some improvement after stopping a few days ago. Doubt dealing with acute etiology; pt appears well, in no distress, no alarm symptoms currently.   We will add a probiotic Gas handout High fiber diet; avoid metamucil for now. May be able to introduce a different fiber supplement later slowly Return in 6 weeks Contact us if worsening of abdominal pain.

## 2011-10-22 ENCOUNTER — Other Ambulatory Visit: Payer: Self-pay | Admitting: Infectious Diseases

## 2011-10-22 DIAGNOSIS — B20 Human immunodeficiency virus [HIV] disease: Secondary | ICD-10-CM

## 2011-10-22 MED ORDER — EFAVIRENZ-EMTRICITAB-TENOFOVIR 600-200-300 MG PO TABS: 1.0000 | ORAL_TABLET | Freq: Every day | ORAL | Status: DC

## 2011-10-24 ENCOUNTER — Other Ambulatory Visit (INDEPENDENT_AMBULATORY_CARE_PROVIDER_SITE_OTHER): Payer: 59

## 2011-10-24 ENCOUNTER — Other Ambulatory Visit: Payer: Self-pay | Admitting: Infectious Diseases

## 2011-10-24 DIAGNOSIS — B20 Human immunodeficiency virus [HIV] disease: Secondary | ICD-10-CM

## 2011-10-24 LAB — COMPREHENSIVE METABOLIC PANEL
ALT: 27 U/L (ref 0–53)
AST: 20 U/L (ref 0–37)
Alkaline Phosphatase: 80 U/L (ref 39–117)
BUN: 15 mg/dL (ref 6–23)
Calcium: 9.4 mg/dL (ref 8.4–10.5)
Chloride: 103 mEq/L (ref 96–112)
Creat: 0.89 mg/dL (ref 0.50–1.35)
Total Bilirubin: 0.4 mg/dL (ref 0.3–1.2)

## 2011-10-24 LAB — CBC WITH DIFFERENTIAL/PLATELET
Basophils Absolute: 0 10*3/uL (ref 0.0–0.1)
Basophils Relative: 1 % (ref 0–1)
Eosinophils Relative: 7 % — ABNORMAL HIGH (ref 0–5)
Lymphocytes Relative: 47 % — ABNORMAL HIGH (ref 12–46)
MCHC: 33.2 g/dL (ref 30.0–36.0)
MCV: 82.4 fL (ref 78.0–100.0)
Platelets: 255 10*3/uL (ref 150–400)
RDW: 13 % (ref 11.5–15.5)
WBC: 5.7 10*3/uL (ref 4.0–10.5)

## 2011-10-25 ENCOUNTER — Ambulatory Visit (INDEPENDENT_AMBULATORY_CARE_PROVIDER_SITE_OTHER): Payer: 59 | Admitting: Urgent Care

## 2011-10-25 ENCOUNTER — Ambulatory Visit: Payer: 59 | Admitting: Gastroenterology

## 2011-10-25 ENCOUNTER — Encounter: Payer: Self-pay | Admitting: Internal Medicine

## 2011-10-25 ENCOUNTER — Encounter: Payer: Self-pay | Admitting: Urgent Care

## 2011-10-25 DIAGNOSIS — K921 Melena: Secondary | ICD-10-CM

## 2011-10-25 DIAGNOSIS — K219 Gastro-esophageal reflux disease without esophagitis: Secondary | ICD-10-CM

## 2011-10-25 DIAGNOSIS — R109 Unspecified abdominal pain: Secondary | ICD-10-CM

## 2011-10-25 LAB — T-HELPER CELL (CD4) - (RCID CLINIC ONLY)
CD4 % Helper T Cell: 27 % — ABNORMAL LOW (ref 33–55)
CD4 T Cell Abs: 680 uL (ref 400–2700)

## 2011-10-25 NOTE — Assessment & Plan Note (Signed)
Intermittent left sided abdominal pain has resolved. He is doing well with align.  Denies any further hematochezia.

## 2011-10-25 NOTE — Progress Notes (Signed)
Primary Care Physician:  No primary provider on file. Primary Gastroenterologist:  Dr. Jena Gauss  Chief Complaint  Patient presents with  . Follow-up    GERD/abdominal pain   [employer] Interpreter [phone] HPI:  Luis Gomez is a 55 y.o. male here for follow up for GERD and abdominal pain.  He was last seen here 09/18/11 or abdominal pain and hematochezia. He tells me he is much better. He is seeing no further bleeding. He notes a rash developed in his suprapubic area approximately 4 days ago.  He went to [facility] & was started on doxycycline.  Denies any upper GI symptoms including heartburn, indigestion, nausea, vomiting, dysphagia, odynophagia or anorexia.  Denies any lower GI symptoms including constipation, diarrhea, rectal bleeding, melena or weight loss.  Align really seems to help.  Some gas whenever I eat too much.  Walks a lot, seems to get irritation to anorectum.  Uses cream to area as needed.    Past Medical History  Diagnosis Date  . GERD (gastroesophageal reflux disease)     Last EGD->07/02/06 normal  . HIV disease   . S/P colonoscopy 07/02/2006    Left sided diverticula  . Diverticulosis   . Genital HSV     Past Surgical History  Procedure Date  . Back surgery   . Esophagogastroduodenoscopy 07/03/2006    Normal esophagus, stomach, D1, D2.  . Colonoscopy 07/03/2006    Normal rectum, left-sided diverticulum    Current Outpatient Prescriptions  Medication Sig Dispense Refill  . doxycycline (VIBRAMYCIN) 100 MG capsule Take 100 mg by mouth daily.        Marland Kitchen efavirenz-emtrictabine-tenofovir (ATRIPLA) 600-200-300 MG per tablet Take 1 tablet by mouth at bedtime.  30 tablet  6  . hydrocortisone (ANUSOL-HC) 2.5 % rectal cream Apply rectally 2 times daily for 10 days.  30 g  1  . lansoprazole (PREVACID) 15 MG capsule Take 1 capsule (15 mg total) by mouth daily.  31 capsule  11  . Probiotic Product (ALIGN) 4 MG CAPS Take 1 capsule by mouth 1 day or 1 dose.  31 capsule  3     Allergies as of 10/25/2011  . (No Known Allergies)  Review of Systems: Gen: Denies any fever, chills, sweats, anorexia, fatigue, weakness, malaise, weight loss, and sleep disorder CV: Denies chest pain, angina, palpitations, syncope, orthopnea, PND, peripheral edema, and claudication. Resp: Denies dyspnea at rest, dyspnea with exercise, cough, sputum, wheezing, coughing up blood, and pleurisy. GI: Denies vomiting blood, jaundice, and fecal incontinence.   Denies dysphagia or odynophagia. Derm: Denies rash, itching, dry skin, hives, moles, warts, or unhealing ulcers.  Psych: Denies depression, anxiety, memory loss, suicidal ideation, hallucinations, paranoia, and confusion. Heme: Denies bruising, bleeding, and enlarged lymph nodes.  Physical Exam: BP 118/75  Pulse 77  Temp(Src) 98.5 F (36.9 C) (Temporal)  Ht 5\' 6"  (1.676 m)  Wt 175 lb 9.6 oz (79.652 kg)  BMI 28.34 kg/m2 General:   Alert,  Well-developed, well-nourished, pleasant and cooperative in NAD Eyes:  Sclera clear, no icterus.   Conjunctiva pink. Mouth:  No deformity or lesions, oropharynx pink and moist. Neck:  Supple; no masses or thyromegaly. Heart:  Regular rate and rhythm; no murmurs, clicks, rubs,  or gallops. Abdomen:  Ulcers on erythematous base at the suprapubic hairline. Normal bowel sounds.  No bruits.  Soft, non-tender and non-distended without masses, hepatosplenomegaly or hernias noted.  No guarding or rebound tenderness.   Msk:  Symmetrical withounormt gross deformities.  Pulses:  Normal pulses noted.  Extremities:  No clubbing or edema. Neurologic:  Alert and oriented x4;  grossly normal neurologically. Skin:  Intact without significant lesions or rashes.

## 2011-10-25 NOTE — Progress Notes (Signed)
Cc to Dr Ninetta Lights

## 2011-10-25 NOTE — Assessment & Plan Note (Addendum)
Well controlled on Prevacid.  Office visit in 6 months worsened or if needed. Abdominal pain literature given in Spanish. He is instructed to call us back if he develops further rectal bleeding.

## 2011-10-25 NOTE — Patient Instructions (Signed)
Office visit in 6 months Call sooner if needed  Dolor abdominal (Abdominal Pain) El dolor de estmago (abdominal) puede deberse a mltiples causas. No suelen ser peligrosos. La intensidad del dolor no tienen que ver con la gravedad del problema. Muchos de estos casos de dolor abdominal pueden controlarse y tratarse en casa. CUIDADOS EN EL HOGAR  No tome ni administre medicamentos como laxantes, excepto que el mdico se lo indique. Este medicamento hace que una persona evace el intestino.   Use analgsicos y [employer], segn los que le ha prescripto el mdico.   Coma o beba lo que le indique el mdico. El mdico le dir si debe seguir una dieta especial.  BUSQUE AYUDA DE INMEDIATO SI:  El dolor persiste.   Tiene fiebre.   Usted sigue vomitando.   El dolor cambia y se localiza slo en la zona derecha o izquierda del abdomen.   La materia fecal es sangre o parece alquitrn.  ASEGRESE DE QUE:   Comprende esas instrucciones para el alta mdica.   Controlar su enfermedad.   Pedir ayuda de inmediato si no mejora o empeora.  Document Released: 01/18/2009 Document Revised: 07/04/2011 [facility] Patient Information 2012 [place], [place].

## 2011-10-25 NOTE — Assessment & Plan Note (Signed)
Resolved

## 2011-11-07 ENCOUNTER — Telehealth: Payer: Self-pay | Admitting: Infectious Diseases

## 2011-11-07 ENCOUNTER — Ambulatory Visit (INDEPENDENT_AMBULATORY_CARE_PROVIDER_SITE_OTHER): Payer: 59 | Admitting: Infectious Diseases

## 2011-11-07 ENCOUNTER — Encounter: Payer: Self-pay | Admitting: Infectious Diseases

## 2011-11-07 VITALS — BP 133/78 | HR 85 | Temp 97.5°F | Ht 66.0 in | Wt 178.0 lb

## 2011-11-07 DIAGNOSIS — E119 Type 2 diabetes mellitus without complications: Secondary | ICD-10-CM

## 2011-11-07 DIAGNOSIS — Z113 Encounter for screening for infections with a predominantly sexual mode of transmission: Secondary | ICD-10-CM

## 2011-11-07 DIAGNOSIS — Z23 Encounter for immunization: Secondary | ICD-10-CM

## 2011-11-07 DIAGNOSIS — B20 Human immunodeficiency virus [HIV] disease: Secondary | ICD-10-CM

## 2011-11-07 DIAGNOSIS — Z79899 Other long term (current) drug therapy: Secondary | ICD-10-CM

## 2011-11-07 DIAGNOSIS — N529 Male erectile dysfunction, unspecified: Secondary | ICD-10-CM | POA: Insufficient documentation

## 2011-11-07 MED ORDER — SILDENAFIL CITRATE 25 MG PO TABS: 25.0000 mg | ORAL_TABLET | Freq: Every day | ORAL | Status: DC | PRN

## 2011-11-07 NOTE — Progress Notes (Signed)
  Subjective:    Patient ID: Luis Gomez, male    DOB: 1956/03/13, 56 y.o.   MRN: [id number]  HPI 56 yo M HIV+, currently on [place]. He has also been eval in 2012 for abd pain and hematochezia. He has been tx for GERD. Abd pain is now resolved.  Last CD4 680 and VL <20 (10-24-11).  States he took rx for HSV and took for 7 days. Due to back pain he stopped early. No rash or sores on his lower back. Pain is worse with prolonged sitting.    Review of Systems  Constitutional: Negative for appetite change.  Gastrointestinal: Negative for diarrhea and constipation.  Genitourinary: Negative for dysuria.  Musculoskeletal: Positive for back pain.  also complains of impotency.      Objective:   Physical Exam  Constitutional: He appears well-developed and well-nourished.  HENT:  Mouth/Throat: Oropharynx is clear and moist. No oropharyngeal exudate.  Eyes: EOM are normal. Pupils are equal, round, and reactive to light.  Neck: Neck supple.  Cardiovascular: Normal rate, regular rhythm and normal heart sounds.   Pulmonary/Chest: Effort normal and breath sounds normal.  Abdominal: Soft. Bowel sounds are normal. There is no tenderness.  Lymphadenopathy:    He has no cervical adenopathy.          Assessment & Plan:

## 2011-11-07 NOTE — Assessment & Plan Note (Signed)
Will give pt viagra (and condoms).

## 2011-11-07 NOTE — Telephone Encounter (Signed)
Received notification from [employer] that patient has temporary assistance.  I called [employer], spoke with Natalia Leatherwood.  His acceptance has been extended for a full year - 08-29-11 - 08-28-12.

## 2011-11-07 NOTE — Assessment & Plan Note (Signed)
He is doing well. His CD4 and VL are told to him, he understands these and is encouraged. Gets condoms today. Gets PNVx today. No missed meds. Will see him back in 4-5 months. Had colonoscopy 2007

## 2011-11-07 NOTE — Assessment & Plan Note (Signed)
Listed in his problem list but on no Rx. Will send HgBA1C at next visit.

## 2011-12-11 NOTE — Progress Notes (Signed)
Subjective:    Patient ID: Luis Gomez is a 56 y.o. male.  Chief Complaint: HIV Follow-up Visit Luis Gomez H is here for follow-up of HIV infection. He is feeling unchanged since his last visit.  He claims continued adherence to therapy with good tolerance and no complications. There are not additional complaints.   Data Review: Diagnostic studies reviewed.  Review of Systems - General ROS: negative for - fatigue, fever or malaise Psychological ROS: negative for - anxiety, behavioral disorder, concentration difficulties, decreased libido, depression or mood swings Ophthalmic ROS: negative for - blurry vision, decreased vision or loss of vision ENT ROS: negative Endocrine ROS: negative for - polydipsia/polyuria Respiratory ROS: no cough, shortness of breath, or wheezing Cardiovascular ROS: no chest pain or dyspnea on exertion Gastrointestinal ROS: no abdominal pain, change in bowel habits, or black or bloody stools Neurological ROS: no TIA or stroke symptoms Dermatological ROS: negative for rash and skin lesion changes  Objective:   General appearance: alert and cooperative Head: Normocephalic, without obvious abnormality, atraumatic Eyes: conjunctivae/corneas clear. PERRL, EOM's intact. Fundi benign. Ears: normal TM's and external ear canals both ears Throat: lips, mucosa, and tongue normal; teeth and gums normal Resp: clear to auscultation bilaterally Cardio: regular rate and rhythm, S1, S2 normal, no murmur, click, rub or gallop GI: soft, non-tender; bowel sounds normal; no masses,  no organomegaly Skin: Skin color, texture, turgor normal. No rashes or lesions Neurologic: Alert and oriented X 3, normal strength and tone. Normal symmetric reflexes. Normal coordination and gait Psych:  No vegetative signs or delusional behaviors noted.    Laboratory: From 06/07/2011 ,  CD4 count was 730 c/cmm @ 31 %. Viral load <20 copies/ml.     Assessment/Plan:   HIV  DISEASE Clinically stable on current regimen. Continue present management.  Counseling provided on prevention of transmission of HIV. Condoms offered:  Yes Medication adherence discussed with patient.  Follow up visit in 4 months with labs 2 weeks prior to appointment. Patient acknowledged information provided to them and agreed with plan of care.   DIABETES MELLITUS, TYPE II Appears clinically stable. Recommend continuing followup with his primary care provider.      Virgle Arth A. Sundra Aland, MS, [facility] [phone]  12/11/2011, 8:53 AM

## 2011-12-11 NOTE — Assessment & Plan Note (Signed)
Clinically stable on current regimen. Continue present management.  Counseling provided on prevention of transmission of HIV. Condoms offered:  Yes Medication adherence discussed with patient.  Follow up visit in 4 months with labs 2 weeks prior to appointment. Patient acknowledged information provided to them and agreed with plan of care.    

## 2011-12-11 NOTE — Assessment & Plan Note (Signed)
Appears clinically stable. Recommend continuing followup with his primary care provider.

## 2011-12-18 DIAGNOSIS — N390 Urinary tract infection, site not specified: Secondary | ICD-10-CM | POA: Insufficient documentation

## 2011-12-18 NOTE — Assessment & Plan Note (Signed)
Symptoms are most consistent with UTI. We'll obtain urinalysis, and urine for GC and Chlamydia and provided for him. Cipro 500 mg twice a day x10 days.

## 2011-12-18 NOTE — Progress Notes (Signed)
Subjective:  Presents with complaints of increased urinary frequency, but not urgency, diminished urinary output with change in urine color, and odor. Also, complains of some bilateral flank pain that occurred at the same time. Denies any testicular pain. Denies urgency, or incontinence. Denies, fevers, chills, or sweats.  Review of Systems -see history of present illness  Objective:    General appearance: alert, cooperative and no distress GI: soft, non-tender; bowel sounds normal; no masses,  no organomegaly Male genitalia: normal, penis: no lesions or discharge. testes: no masses or tenderness. no hernias, normal findings: Prostate exam normal Psych:  No vegetative signs or delusional behaviors noted.     Assessment/Plan:  UTI (urinary tract infection) Symptoms are most consistent with UTI. We'll obtain urinalysis, and urine for GC and Chlamydia and provided for him. Cipro 500 mg twice a day x10 days.      Bettejane Leavens A. Sundra Aland, MS, [facility] [phone]  12/18/2011,  6:49 PM

## 2012-02-11 ENCOUNTER — Other Ambulatory Visit: Payer: Self-pay | Admitting: Infectious Diseases

## 2012-02-11 DIAGNOSIS — Z113 Encounter for screening for infections with a predominantly sexual mode of transmission: Secondary | ICD-10-CM

## 2012-03-05 ENCOUNTER — Other Ambulatory Visit: Payer: 59

## 2012-03-19 ENCOUNTER — Ambulatory Visit: Payer: 59 | Admitting: Infectious Diseases

## 2012-03-19 ENCOUNTER — Other Ambulatory Visit: Payer: 59

## 2012-03-19 DIAGNOSIS — B20 Human immunodeficiency virus [HIV] disease: Secondary | ICD-10-CM

## 2012-03-19 DIAGNOSIS — Z79899 Other long term (current) drug therapy: Secondary | ICD-10-CM

## 2012-03-19 DIAGNOSIS — Z113 Encounter for screening for infections with a predominantly sexual mode of transmission: Secondary | ICD-10-CM

## 2012-03-19 LAB — LIPID PANEL
Cholesterol: 183 mg/dL (ref 0–200)
LDL Cholesterol: 114 mg/dL — ABNORMAL HIGH (ref 0–99)
Total CHOL/HDL Ratio: 4.3 Ratio
Triglycerides: 128 mg/dL (ref <150)
VLDL: 26 mg/dL (ref 0–40)

## 2012-03-19 LAB — COMPLETE METABOLIC PANEL WITH GFR
ALT: 59 U/L — ABNORMAL HIGH (ref 0–53)
AST: 32 U/L (ref 0–37)
Alkaline Phosphatase: 69 U/L (ref 39–117)
BUN: 15 mg/dL (ref 6–23)
Calcium: 8.6 mg/dL (ref 8.4–10.5)
Chloride: 103 mEq/L (ref 96–112)
Creat: 0.83 mg/dL (ref 0.50–1.35)
Potassium: 4 mEq/L (ref 3.5–5.3)

## 2012-03-19 LAB — CBC
HCT: 42.4 % (ref 39.0–52.0)
MCHC: 32.3 g/dL (ref 30.0–36.0)
Platelets: 249 10*3/uL (ref 150–400)
RDW: 13.3 % (ref 11.5–15.5)
WBC: 4.9 10*3/uL (ref 4.0–10.5)

## 2012-03-20 LAB — T-HELPER CELL (CD4) - (RCID CLINIC ONLY)
CD4 % Helper T Cell: 34 % (ref 33–55)
CD4 T Cell Abs: 620 uL (ref 400–2700)

## 2012-03-20 LAB — HEMOGLOBIN A1C: Mean Plasma Glucose: 126 mg/dL — ABNORMAL HIGH (ref <117)

## 2012-03-20 LAB — HIV-1 RNA QUANT-NO REFLEX-BLD
HIV 1 RNA Quant: <20 copies/mL (ref <20)
HIV-1 RNA Quant, Log: <1.3 {Log} (ref <1.30)

## 2012-04-02 ENCOUNTER — Ambulatory Visit (INDEPENDENT_AMBULATORY_CARE_PROVIDER_SITE_OTHER): Payer: 59 | Admitting: Infectious Diseases

## 2012-04-02 ENCOUNTER — Other Ambulatory Visit: Payer: Self-pay | Admitting: *Deleted

## 2012-04-02 ENCOUNTER — Encounter: Payer: Self-pay | Admitting: Infectious Diseases

## 2012-04-02 VITALS — BP 133/78 | HR 88 | Temp 97.6°F | Ht 66.0 in | Wt 186.0 lb

## 2012-04-02 DIAGNOSIS — N529 Male erectile dysfunction, unspecified: Secondary | ICD-10-CM

## 2012-04-02 DIAGNOSIS — B20 Human immunodeficiency virus [HIV] disease: Secondary | ICD-10-CM

## 2012-04-02 DIAGNOSIS — B009 Herpesviral infection, unspecified: Secondary | ICD-10-CM

## 2012-04-02 DIAGNOSIS — A609 Anogenital herpesviral infection, unspecified: Secondary | ICD-10-CM | POA: Insufficient documentation

## 2012-04-02 MED ORDER — SILDENAFIL CITRATE 25 MG PO TABS: 25.0000 mg | ORAL_TABLET | Freq: Every day | ORAL | Status: DC | PRN

## 2012-04-02 MED ORDER — VALACYCLOVIR HCL 500 MG PO TABS: 500.0000 mg | ORAL_TABLET | Freq: Two times a day (BID) | ORAL | Status: DC

## 2012-04-02 NOTE — Assessment & Plan Note (Signed)
Will give him rx for Valtrex. He is offered exam but defers. His prev GC/Chlamydia are (-).

## 2012-04-02 NOTE — Progress Notes (Signed)
  Subjective:    Patient ID: Luis Gomez, male    DOB: 1956/04/21, 56 y.o.   MRN: [id number]  HPI 56 yo M HIV+, currently on [place]. He has also been eval in 2012 for abd pain and hematochezia. He has been tx for GERD. Has been doing well, denies missed meds, no abd pain, no hematochezia. 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   HIV 1 RNA Quant (copies/mL)  Date Value  03/19/2012 <20   10/24/2011 <20   06/07/2011 <20      CD4 T Cell Abs (cmm)  Date Value  03/19/2012 620   10/24/2011 680   06/07/2011 730       Review of Systems  Constitutional: Negative for appetite change and unexpected weight change.  Gastrointestinal: Negative for diarrhea, constipation and abdominal distention.  Genitourinary: Negative for dysuria.  Neurological: Negative for headaches.       Objective:   Physical Exam  Constitutional: He appears well-developed and well-nourished.  HENT:  Mouth/Throat: No oropharyngeal exudate.  Eyes: EOM are normal. Pupils are equal, round, and reactive to light.  Neck: Neck supple.  Cardiovascular: Normal rate, regular rhythm and normal heart sounds.   Pulmonary/Chest: Effort normal and breath sounds normal.  Abdominal: Soft. Bowel sounds are normal. He exhibits no distension. There is no tenderness.  Genitourinary:       Offered, pt deffered.   Lymphadenopathy:    He has no cervical adenopathy.          Assessment & Plan:

## 2012-04-02 NOTE — Assessment & Plan Note (Signed)
Will refill his viagra. He is given condoms.

## 2012-04-02 NOTE — Assessment & Plan Note (Signed)
He is doing very well. No problems with his meds. Given condoms. Will see him back in 6 months with labs prior.

## 2012-04-09 ENCOUNTER — Encounter: Payer: Self-pay | Admitting: Internal Medicine

## 2012-06-05 ENCOUNTER — Other Ambulatory Visit (HOSPITAL_COMMUNITY): Payer: Self-pay | Admitting: Internal Medicine

## 2012-06-05 DIAGNOSIS — N508 Other specified disorders of male genital organs: Secondary | ICD-10-CM

## 2012-06-05 DIAGNOSIS — R1031 Right lower quadrant pain: Secondary | ICD-10-CM

## 2012-06-09 ENCOUNTER — Ambulatory Visit (HOSPITAL_COMMUNITY)
Admission: RE | Admit: 2012-06-09 | Discharge: 2012-06-09 | Disposition: A | Discharge status: Home / Self Care | Payer: 59 | Source: Ambulatory Visit | Attending: Internal Medicine | Admitting: Internal Medicine

## 2012-06-09 ENCOUNTER — Other Ambulatory Visit (HOSPITAL_COMMUNITY): Payer: Self-pay | Admitting: Internal Medicine

## 2012-06-09 DIAGNOSIS — N508 Other specified disorders of male genital organs: Secondary | ICD-10-CM

## 2012-06-09 DIAGNOSIS — R1031 Right lower quadrant pain: Secondary | ICD-10-CM

## 2012-06-09 DIAGNOSIS — N433 Hydrocele, unspecified: Secondary | ICD-10-CM | POA: Insufficient documentation

## 2012-06-10 ENCOUNTER — Other Ambulatory Visit (HOSPITAL_COMMUNITY): Payer: Self-pay | Admitting: Internal Medicine

## 2012-06-10 DIAGNOSIS — R1031 Right lower quadrant pain: Secondary | ICD-10-CM

## 2012-06-11 ENCOUNTER — Other Ambulatory Visit (HOSPITAL_COMMUNITY): Payer: Self-pay | Admitting: Internal Medicine

## 2012-06-11 ENCOUNTER — Ambulatory Visit (HOSPITAL_COMMUNITY)
Admission: RE | Admit: 2012-06-11 | Discharge: 2012-06-11 | Disposition: A | Discharge status: Home / Self Care | Payer: 59 | Source: Ambulatory Visit | Attending: Internal Medicine | Admitting: Internal Medicine

## 2012-06-11 DIAGNOSIS — R1031 Right lower quadrant pain: Secondary | ICD-10-CM | POA: Insufficient documentation

## 2012-06-12 ENCOUNTER — Encounter: Payer: Self-pay | Admitting: Internal Medicine

## 2012-06-12 ENCOUNTER — Ambulatory Visit (INDEPENDENT_AMBULATORY_CARE_PROVIDER_SITE_OTHER): Payer: 59 | Admitting: Internal Medicine

## 2012-06-12 VITALS — BP 130/84 | HR 96 | Temp 97.9°F | Wt 176.0 lb

## 2012-06-12 DIAGNOSIS — R8290 Unspecified abnormal findings in urine: Secondary | ICD-10-CM

## 2012-06-12 DIAGNOSIS — R82998 Other abnormal findings in urine: Secondary | ICD-10-CM

## 2012-06-12 NOTE — Progress Notes (Signed)
  Subjective:    Patient ID: Luis Gomez, male    DOB: [birth year], 56 y.o.   MRN: [id number]  HPI 56yo Male, spanish speaking with HIV, CD 4 count 620, VL < 20 ( May 2013), on [place]. Continues to have excellent adherence. " i feel bad- i don't know how to explain it". Decreased appetite. Sleep poorly. He reports some stress in his life, 8 days ago one of his close childhood friends passed away. He did not know that he was ill. Initially, he was very concerned but now better. He has been feeling poorly for about 8 days.   He also reports seeing a physician awhile ago, who checked his BS 140( but it was after he had a full meal). He is now doing diet modification for the past week.  He also mentions that after he urinates that there is sediment in his urine, almost everytime. He denies any blood in his urine, no dysuria. No penile rash or exudate. No flank pain   Review of Systems     Objective:   Physical Exam  Deferred, counseling mostly today      Assessment & Plan:  Situation dysthymia/Acute adjustment/mourning the loss of a friend = should improve spontaneously. Reinforced that speaking with others will help.  Abn urine? Increased sedimentation = check ua  rtc in oct. For flu shot  Spent 25 minutes counseling

## 2012-06-14 LAB — URINALYSIS, ROUTINE W REFLEX MICROSCOPIC
Glucose, UA: NEGATIVE mg/dL
Leukocytes, UA: NEGATIVE
Protein, ur: NEGATIVE mg/dL
Urobilinogen, UA: 0.2 mg/dL (ref 0.0–1.0)

## 2012-07-21 ENCOUNTER — Telehealth: Payer: Self-pay | Admitting: Infectious Disease

## 2012-07-21 NOTE — Telephone Encounter (Signed)
Letter sent to [name] requesting he call me to set up an appointment for re-apply for his [employer]ri[employer]

## 2012-07-28 ENCOUNTER — Other Ambulatory Visit: Payer: Self-pay | Admitting: *Deleted

## 2012-07-28 ENCOUNTER — Encounter: Payer: Self-pay | Admitting: Internal Medicine

## 2012-07-28 DIAGNOSIS — B20 Human immunodeficiency virus [HIV] disease: Secondary | ICD-10-CM

## 2012-07-28 MED ORDER — EFAVIRENZ-EMTRICITAB-TENOFOVIR 600-200-300 MG PO TABS: 1.0000 | ORAL_TABLET | Freq: Every day | ORAL | Status: DC

## 2012-07-30 ENCOUNTER — Other Ambulatory Visit: Payer: 59

## 2012-07-30 DIAGNOSIS — B20 Human immunodeficiency virus [HIV] disease: Secondary | ICD-10-CM

## 2012-07-30 LAB — COMPREHENSIVE METABOLIC PANEL
ALT: 19 U/L (ref 0–53)
AST: 18 U/L (ref 0–37)
Alkaline Phosphatase: 76 U/L (ref 39–117)
Sodium: 138 mEq/L (ref 135–145)
Total Bilirubin: 0.4 mg/dL (ref 0.3–1.2)
Total Protein: 6.8 g/dL (ref 6.0–8.3)

## 2012-07-30 LAB — CBC
MCH: 27.5 pg (ref 26.0–34.0)
MCHC: 33.6 g/dL (ref 30.0–36.0)
Platelets: 279 10*3/uL (ref 150–400)
RBC: 5.28 MIL/uL (ref 4.22–5.81)

## 2012-07-31 ENCOUNTER — Other Ambulatory Visit: Payer: Self-pay | Admitting: Infectious Diseases

## 2012-07-31 LAB — HIV-1 RNA QUANT-NO REFLEX-BLD: HIV 1 RNA Quant: <20 copies/mL (ref <20)

## 2012-08-05 ENCOUNTER — Telehealth: Payer: Self-pay | Admitting: Infectious Diseases

## 2012-08-05 NOTE — Telephone Encounter (Signed)
Faxed [name] application to [employer] Patient Assistance today.

## 2012-08-12 ENCOUNTER — Telehealth: Payer: Self-pay | Admitting: Infectious Diseases

## 2012-08-12 NOTE — Telephone Encounter (Signed)
Received fax from [employer] stating Luis Gomez has been approved for his Atripla until 02-02-13

## 2012-08-13 ENCOUNTER — Ambulatory Visit: Payer: 59 | Admitting: Infectious Diseases

## 2012-08-14 ENCOUNTER — Encounter: Payer: Self-pay | Admitting: Infectious Diseases

## 2012-08-14 ENCOUNTER — Ambulatory Visit (INDEPENDENT_AMBULATORY_CARE_PROVIDER_SITE_OTHER): Payer: 59 | Admitting: Infectious Diseases

## 2012-08-14 ENCOUNTER — Other Ambulatory Visit: Payer: Self-pay | Admitting: *Deleted

## 2012-08-14 VITALS — BP 128/75 | HR 86 | Temp 97.6°F | Ht 66.0 in | Wt 176.0 lb

## 2012-08-14 DIAGNOSIS — Z23 Encounter for immunization: Secondary | ICD-10-CM

## 2012-08-14 DIAGNOSIS — E119 Type 2 diabetes mellitus without complications: Secondary | ICD-10-CM

## 2012-08-14 DIAGNOSIS — A609 Anogenital herpesviral infection, unspecified: Secondary | ICD-10-CM

## 2012-08-14 DIAGNOSIS — B009 Herpesviral infection, unspecified: Secondary | ICD-10-CM

## 2012-08-14 DIAGNOSIS — B20 Human immunodeficiency virus [HIV] disease: Secondary | ICD-10-CM

## 2012-08-14 DIAGNOSIS — K219 Gastro-esophageal reflux disease without esophagitis: Secondary | ICD-10-CM

## 2012-08-14 MED ORDER — LANSOPRAZOLE 15 MG PO CPDR: 15.0000 mg | DELAYED_RELEASE_CAPSULE | Freq: Every day | ORAL | Status: DC

## 2012-08-14 MED ORDER — EFAVIRENZ-EMTRICITAB-TENOFOVIR 600-200-300 MG PO TABS: 90.0000 | ORAL_TABLET | Freq: Every day | ORAL | Status: DC

## 2012-08-14 MED ORDER — VALACYCLOVIR HCL 500 MG PO TABS: 500.0000 mg | ORAL_TABLET | Freq: Two times a day (BID) | ORAL | Status: DC

## 2012-08-14 NOTE — Assessment & Plan Note (Signed)
Still taking PPI. Would like refill.

## 2012-08-14 NOTE — Progress Notes (Signed)
  Subjective:    Patient ID: Luis Gomez, male    DOB: [birth year], 56 y.o.   MRN: [id number]  HPI 56 yo M HIV+, currently on [place]. He has also been eval in 2012 for abd pain and hematochezia. He has been tx for GERD. Was seen in August for dysphoria and urinary d/c. Had u/a (-), u/s (Overall, no significant changes are seen compared with the prior scrotal ultrasound from 2009.  No acute findings identified. 2.  Bilateral hydroceles with scrotal pearls on the right.3.  Large left epididymal cyst demonstrates low-level echoes and"flow" with color Doppler, likely due to motion of the internal contents.  This has been demonstrated on studies dating back to 05/10/2005. Denies feeling poorly, prev sadness resolved. Denies problems with urination. No problems with medications. Does not check FSG at home. Had cough for 20 days last month. Has since improved, took medication for this (? name).   HIV 1 RNA Quant (copies/mL)  Date Value  07/30/2012 <20   03/19/2012 <20   10/24/2011 <20      CD4 T Cell Abs (cmm)  Date Value  07/30/2012 770   03/19/2012 620   10/24/2011 680       Review of Systems  Constitutional: Negative for appetite change and unexpected weight change.  Respiratory: Negative for cough and shortness of breath.   Gastrointestinal: Negative for diarrhea and constipation.  Genitourinary: Negative for dysuria.  Neurological: Negative for headaches.       Objective:   Physical Exam  Constitutional: He appears well-developed and well-nourished.  HENT:  Mouth/Throat: No oropharyngeal exudate.  Eyes: EOM are normal. Pupils are equal, round, and reactive to light.  Neck: Neck supple.  Cardiovascular: Normal rate, regular rhythm and normal heart sounds.   Pulmonary/Chest: Effort normal and breath sounds normal.  Abdominal: Soft. Bowel sounds are normal. There is no tenderness.  Lymphadenopathy:    He has no cervical adenopathy.  Neurological:       Grossly nl light  touch  Skin: Skin is warm and dry.       No diabetic foot lesions.           Assessment & Plan:

## 2012-08-14 NOTE — Assessment & Plan Note (Signed)
Continues to have outbreaks. Advised him to take vtx daily. He is reluctant to do this do to GI upset. Will refill.

## 2012-08-14 NOTE — Assessment & Plan Note (Addendum)
He is doing very well with his hiv. Cont his atripla. Spent > 10 minutes trying to help him fil [employer]. Given condoms, flu shot. See him back in 6 months.

## 2012-08-14 NOTE — Assessment & Plan Note (Signed)
controlled with diet. Knows that he needs to watch his diet.

## 2012-10-06 ENCOUNTER — Ambulatory Visit: Payer: 59 | Admitting: Infectious Diseases

## 2012-10-20 ENCOUNTER — Other Ambulatory Visit: Payer: 59

## 2012-11-03 ENCOUNTER — Ambulatory Visit: Payer: 59 | Admitting: Infectious Diseases

## 2013-01-19 ENCOUNTER — Other Ambulatory Visit: Payer: Self-pay | Admitting: *Deleted

## 2013-01-19 ENCOUNTER — Other Ambulatory Visit: Payer: Self-pay | Admitting: Infectious Diseases

## 2013-01-19 DIAGNOSIS — B20 Human immunodeficiency virus [HIV] disease: Secondary | ICD-10-CM

## 2013-01-19 MED ORDER — EFAVIRENZ-EMTRICITAB-TENOFOVIR 600-200-300 MG PO TABS: 1.0000 | ORAL_TABLET | Freq: Every day | ORAL | Status: DC

## 2013-01-19 NOTE — Progress Notes (Signed)
Reorder for PAP application

## 2013-01-26 ENCOUNTER — Telehealth: Payer: Self-pay | Admitting: Infectious Diseases

## 2013-01-26 NOTE — Telephone Encounter (Signed)
Faxed application to [employer] today.

## 2013-02-11 ENCOUNTER — Other Ambulatory Visit: Payer: Self-pay | Admitting: Infectious Diseases

## 2013-02-11 ENCOUNTER — Other Ambulatory Visit: Payer: 59

## 2013-02-11 DIAGNOSIS — B20 Human immunodeficiency virus [HIV] disease: Secondary | ICD-10-CM

## 2013-02-11 LAB — LIPID PANEL
Cholesterol: 172 mg/dL (ref 0–200)
LDL Cholesterol: 110 mg/dL — ABNORMAL HIGH (ref 0–99)
Triglycerides: 140 mg/dL (ref <150)

## 2013-02-11 LAB — COMPREHENSIVE METABOLIC PANEL
Albumin: 4.6 g/dL (ref 3.5–5.2)
Alkaline Phosphatase: 72 U/L (ref 39–117)
CO2: 27 mEq/L (ref 19–32)
Glucose, Bld: 115 mg/dL — ABNORMAL HIGH (ref 70–99)
Potassium: 4 mEq/L (ref 3.5–5.3)
Sodium: 141 mEq/L (ref 135–145)
Total Protein: 6.9 g/dL (ref 6.0–8.3)

## 2013-02-11 LAB — CBC
Hemoglobin: 14.2 g/dL (ref 13.0–17.0)
RBC: 5.17 MIL/uL (ref 4.22–5.81)

## 2013-02-12 LAB — T-HELPER CELL (CD4) - (RCID CLINIC ONLY)
CD4 % Helper T Cell: 32 % — ABNORMAL LOW (ref 33–55)
CD4 T Cell Abs: 690 uL (ref 400–2700)

## 2013-02-13 LAB — HIV-1 RNA QUANT-NO REFLEX-BLD
HIV 1 RNA Quant: <20 copies/mL (ref <20)
HIV-1 RNA Quant, Log: <1.3 {Log} (ref <1.30)

## 2013-02-16 ENCOUNTER — Telehealth: Payer: Self-pay | Admitting: Infectious Diseases

## 2013-02-16 NOTE — Telephone Encounter (Signed)
I called [name]' insurance company.  Per Barbara Cower the Atripla will cost $425.00 out of pocket per month.  I will give [name] the $60. copay card.  His out-of-pocket will be $25 per month.  I called him and set up an appointment for Thursday, 4-17 for him to come and get his card.

## 2013-02-16 NOTE — Telephone Encounter (Signed)
Called [employer] to check status on his application for Atri[employer].  I was told they are waiting for insurance verification on prior auth.  Will call back on Thursday.

## 2013-02-25 ENCOUNTER — Ambulatory Visit (INDEPENDENT_AMBULATORY_CARE_PROVIDER_SITE_OTHER): Payer: 59 | Admitting: Infectious Diseases

## 2013-02-25 ENCOUNTER — Encounter: Payer: Self-pay | Admitting: Infectious Diseases

## 2013-02-25 VITALS — BP 138/84 | HR 98 | Temp 97.7°F | Ht 66.0 in | Wt 181.0 lb

## 2013-02-25 DIAGNOSIS — B20 Human immunodeficiency virus [HIV] disease: Secondary | ICD-10-CM

## 2013-02-25 DIAGNOSIS — E119 Type 2 diabetes mellitus without complications: Secondary | ICD-10-CM

## 2013-02-25 DIAGNOSIS — A609 Anogenital herpesviral infection, unspecified: Secondary | ICD-10-CM

## 2013-02-25 DIAGNOSIS — B009 Herpesviral infection, unspecified: Secondary | ICD-10-CM

## 2013-02-25 MED ORDER — VALACYCLOVIR HCL 500 MG PO TABS: 500.0000 mg | ORAL_TABLET | Freq: Two times a day (BID) | ORAL | Status: DC

## 2013-02-25 NOTE — Assessment & Plan Note (Signed)
Will refill his valtrex.  

## 2013-02-25 NOTE — Assessment & Plan Note (Signed)
Appears to be controlled with diet. Will continue to f/u with PCP. Needs A1C.

## 2013-02-25 NOTE — Assessment & Plan Note (Signed)
HIV 1 RNA Quant (copies/mL)  Date Value  02/11/2013 <20   07/30/2012 <20   03/19/2012 <20      CD4 T Cell Abs (cmm)  Date Value  02/11/2013 690   07/30/2012 770   03/19/2012 620     Doing very well. Asked pt about changing his art due to flushing but he is too scared to do so. Is taking at hs. Will see him back in 6 months. He is given condoms. vax up to date.

## 2013-02-25 NOTE — Progress Notes (Signed)
  Subjective:    Patient ID: Luis Gomez, male    DOB: 16-Jun-1956, 57 y.o.   MRN: [id number]  HPI 57 yo M HIV+, currently on [place]. He has also been eval in 2012 for abd pain and hematochezia. He has been tx for GERD.  Having chills and flushing after taking his atripla. No missed doses.  Cont to take prevacid. Also taking valtrex prn (roughly monthly).    Review of Systems  Constitutional: Negative for appetite change and unexpected weight change.  Gastrointestinal: Negative for diarrhea and constipation.  Genitourinary: Negative for difficulty urinating.       Objective:   Physical Exam  Constitutional: He appears well-developed and well-nourished.  HENT:  Mouth/Throat: No oropharyngeal exudate.  Eyes: EOM are normal. Pupils are equal, round, and reactive to light.  Neck: Neck supple.  Cardiovascular: Normal rate, regular rhythm and normal heart sounds.   Pulmonary/Chest: Effort normal and breath sounds normal.  Abdominal: Soft. Bowel sounds are normal. There is no rebound.  Musculoskeletal:       Feet:  Lymphadenopathy:    He has no cervical adenopathy.          Assessment & Plan:

## 2013-03-02 ENCOUNTER — Telehealth: Payer: Self-pay | Admitting: *Deleted

## 2013-03-02 NOTE — Telephone Encounter (Signed)
Patient requested we send his most recent office notes, labs, and med list to his PCP, Justin Mend, PA-C.  Information faxed to [phone]. Andree Coss, RN

## 2013-03-05 ENCOUNTER — Other Ambulatory Visit: Payer: Self-pay | Admitting: *Deleted

## 2013-03-05 NOTE — Telephone Encounter (Signed)
Spoke with pharmacist at [facility] in [place] and patient's copay is $426.00 per month. We have copay cards for $400 per month. Explained to patient that he can pick up a copay card here and his medication would be $26.00 per month. Copay card left in folder upfront. Wendall Mola

## 2013-03-16 ENCOUNTER — Telehealth: Payer: Self-pay | Admitting: Infectious Diseases

## 2013-03-16 NOTE — Telephone Encounter (Signed)
[name] came by this morning for me to activate his co-pay card for him.  It is now activated.

## 2013-05-20 ENCOUNTER — Telehealth: Payer: Self-pay | Admitting: *Deleted

## 2013-05-20 NOTE — Telephone Encounter (Signed)
Received a call from Mr. Marcina Millard daughter (interpretor).  Mr. Carolin Coy could no longer use his co-pay card for his medication.  He has a $5000 cap on name brand medications.  I called the [employer] foundation and spoke with Mat.  Mat said Mr. Carolin Coy did Harle Battiest for the patient assistance through their program since he has a cap.  He was sorry for the mis-information.  Mr. Carolin Coy is now enrolled with [employer] through 05-20-14.  I called and told the patient.  His daughter will call the assistance program tomorrow and get his ID number.

## 2013-05-22 ENCOUNTER — Emergency Department (HOSPITAL_COMMUNITY)
Admission: EM | Admit: 2013-05-22 | Discharge: 2013-05-22 | Disposition: A | Discharge status: Home / Self Care | Payer: 59 | Attending: Emergency Medicine | Admitting: Emergency Medicine

## 2013-05-22 ENCOUNTER — Emergency Department (HOSPITAL_COMMUNITY): Payer: 59

## 2013-05-22 ENCOUNTER — Encounter (HOSPITAL_COMMUNITY): Payer: Self-pay | Admitting: *Deleted

## 2013-05-22 DIAGNOSIS — R51 Headache: Secondary | ICD-10-CM | POA: Insufficient documentation

## 2013-05-22 DIAGNOSIS — H539 Unspecified visual disturbance: Secondary | ICD-10-CM | POA: Insufficient documentation

## 2013-05-22 DIAGNOSIS — K219 Gastro-esophageal reflux disease without esophagitis: Secondary | ICD-10-CM | POA: Insufficient documentation

## 2013-05-22 DIAGNOSIS — Z8619 Personal history of other infectious and parasitic diseases: Secondary | ICD-10-CM | POA: Insufficient documentation

## 2013-05-22 DIAGNOSIS — Z8719 Personal history of other diseases of the digestive system: Secondary | ICD-10-CM | POA: Insufficient documentation

## 2013-05-22 DIAGNOSIS — Z21 Asymptomatic human immunodeficiency virus [HIV] infection status: Secondary | ICD-10-CM | POA: Insufficient documentation

## 2013-05-22 DIAGNOSIS — H579 Unspecified disorder of eye and adnexa: Secondary | ICD-10-CM | POA: Insufficient documentation

## 2013-05-22 DIAGNOSIS — R109 Unspecified abdominal pain: Secondary | ICD-10-CM | POA: Insufficient documentation

## 2013-05-22 LAB — BASIC METABOLIC PANEL
CO2: 28 mEq/L (ref 19–32)
Calcium: 9.2 mg/dL (ref 8.4–10.5)
GFR calc Af Amer: >90 mL/min (ref >90)
GFR calc non Af Amer: >90 mL/min (ref >90)
Sodium: 137 mEq/L (ref 135–145)

## 2013-05-22 MED ORDER — HYDROCODONE-ACETAMINOPHEN 5-325 MG PO TABS: 1.0000 | ORAL_TABLET | ORAL | Status: DC | PRN

## 2013-05-22 MED ORDER — DEXAMETHASONE 4 MG PO TABS: ORAL_TABLET | ORAL | Status: DC

## 2013-05-22 MED ORDER — PREDNISONE 50 MG PO TABS
60.0000 mg | ORAL_TABLET | Freq: Once | ORAL | Status: AC
  Administered 2013-05-22: 60 mg via ORAL
  Filled 2013-05-22: qty 1

## 2013-05-22 MED ORDER — PSEUDOEPHEDRINE HCL 60 MG PO TABS
60.0000 mg | ORAL_TABLET | Freq: Once | ORAL | Status: AC
  Administered 2013-05-22: 60 mg via ORAL
  Filled 2013-05-22: qty 1

## 2013-05-22 MED ORDER — KETOROLAC TROMETHAMINE 10 MG PO TABS
10.0000 mg | ORAL_TABLET | Freq: Once | ORAL | Status: AC
  Administered 2013-05-22: 10 mg via ORAL
  Filled 2013-05-22: qty 1

## 2013-05-22 NOTE — ED Notes (Addendum)
C/O head "pressure" at R frontal and temporal area x 3 days.  This has been occuring intermittently "for some time".  Associated w/flashes of light in R eye and some blurring of vision.  Has had difficulty staying asleep and has "been a little bit more worried" about things lately.  Has been occasionally taking Advil at times which helps.

## 2013-05-22 NOTE — ED Provider Notes (Signed)
History    CSN: [id number] Arrival date & time 05/22/13  0820  First MD Initiated Contact with Patient 05/22/13 [phone]     Chief Complaint  Patient presents with  . Headache  . Eye Problem   (Consider location/radiation/quality/duration/timing/severity/associated sxs/prior Treatment) Patient is a 57 y.o. male presenting with headaches and eye problem. The history is provided by the patient and a relative. The history is limited by a language barrier. A language interpreter was used.  Headache Pain location:  Frontal and R temporal (right frontal/forehead  headache) Quality:  Dull (pressure) Radiates to:  Does not radiate Onset quality:  Gradual Duration:  3 days Timing:  Intermittent Progression:  Worsening Chronicity:  Recurrent Similar to prior headaches: yes   Context comment:  Unknown Relieved by:  Nothing Worsened by:  Nothing tried Ineffective treatments:  None tried Associated symptoms: abdominal pain   Associated symptoms: no back pain, no cough, no dizziness, no neck pain, no photophobia and no seizures   Associated symptoms comment:  Flashes of light in vision. Risk factors comment:  HIV Eye Problem Associated symptoms: headaches   Associated symptoms: no discharge and no photophobia    Past Medical History  Diagnosis Date  . GERD (gastroesophageal reflux disease)     Last EGD->07/02/06 normal  . HIV disease   . S/P colonoscopy 07/02/2006    Left sided diverticula  . Diverticulosis   . Genital HSV    Past Surgical History  Procedure Laterality Date  . Back surgery    . Esophagogastroduodenoscopy  07/03/2006    Normal esophagus, stomach, D1, D2.  . Colonoscopy  07/03/2006    Normal rectum, left-sided diverticulum   History reviewed. No pertinent family history. History  Substance Use Topics  . Smoking status: Never Smoker   . Smokeless tobacco: Never Used  . Alcohol Use: No    Review of Systems  Constitutional: Negative for activity change.        All ROS Neg except as noted in HPI  HENT: Negative for nosebleeds and neck pain.   Eyes: Positive for visual disturbance. Negative for photophobia and discharge.  Respiratory: Negative for cough, shortness of breath and wheezing.   Cardiovascular: Negative for chest pain and palpitations.  Gastrointestinal: Positive for abdominal pain. Negative for blood in stool.  Genitourinary: Negative for dysuria, frequency and hematuria.  Musculoskeletal: Negative for back pain and arthralgias.  Skin: Negative.   Neurological: Positive for headaches. Negative for dizziness, seizures and speech difficulty.  Psychiatric/Behavioral: Negative for hallucinations and confusion.    Allergies  Review of patient's allergies indicates no known allergies.  Home Medications   Current Outpatient Rx  Name  Route  Sig  Dispense  Refill  . efavirenz-emtricitabine-tenofovir (ATRIPLA) 600-200-300 MG per tablet   Oral   Take 1 tablet by mouth at bedtime.   90 tablet   3   . lansoprazole (PREVACID) 15 MG capsule   Oral   Take 1 capsule (15 mg total) by mouth daily.   31 capsule   11    BP 125/83  Pulse 68  Temp(Src) 97.7 F (36.5 C) (Oral)  Resp 16  Ht 5\' 6"  (1.676 m)  Wt 179 lb (81.194 kg)  BMI 28.91 kg/m2  SpO2 100% Physical Exam  Nursing note and vitals reviewed. Constitutional: He is oriented to person, place, and time. He appears well-developed and well-nourished.  Non-toxic appearance.  HENT:  Head: Normocephalic.  Right Ear: Tympanic membrane and external ear normal.  Left Ear: Tympanic  membrane and external ear normal.  Eyes: Conjunctivae, EOM and lids are normal. Pupils are equal, round, and reactive to light.  Fundoscopic exam:      The right eye shows no AV nicking, no exudate, no hemorrhage and no papilledema.       The left eye shows no AV nicking, no exudate, no hemorrhage and no papilledema.  Neck: Normal range of motion. Neck supple. Carotid bruit is not present.   Cardiovascular: Normal rate, regular rhythm, normal heart sounds, intact distal pulses and normal pulses.   Pulmonary/Chest: Breath sounds normal. No respiratory distress.  Abdominal: Soft. Bowel sounds are normal. There is no tenderness. There is no guarding.  Musculoskeletal: Normal range of motion.  Lymphadenopathy:       Head (right side): No submandibular adenopathy present.       Head (left side): No submandibular adenopathy present.    He has no cervical adenopathy.  Neurological: He is alert and oriented to person, place, and time. He has normal strength. No cranial nerve deficit or sensory deficit.  Skin: Skin is warm and dry.  Psychiatric: He has a normal mood and affect. His speech is normal.    ED Course  Procedures (including critical care time) Labs Reviewed  BASIC METABOLIC PANEL - Abnormal; Notable for the following:    Glucose, Bld 121 (*)    All other components within normal limits   Ct Head Wo Contrast  05/22/2013   *RADIOLOGY REPORT*  Clinical Data: Headache.  CT HEAD WITHOUT CONTRAST  Technique:  Contiguous axial images were obtained from the base of the skull through the vertex without contrast.  Comparison: None.  Findings: Bony calvarium appears intact.  Mild right maxillary sinusitis is noted.  Mild diffuse cortical atrophy is noted. No mass effect or midline shift is noted.  Ventricular size is within normal limits.  There is no evidence of mass lesion, hemorrhage or acute infarction.  IMPRESSION: Mild right maxillary sinusitis.  Mild diffuse cortical atrophy.  No acute intracranial abnormality seen.   Original Report Authenticated By: Lupita Raider.,  M.D.   No diagnosis found.  MDM  *I have reviewed nursing notes, vital signs, and all appropriate lab and imaging results for this patient.** Pt presents to ED with 3 days of frontal, forehead, temporal area pain. This headache is similar to previous headaches. Pt denies fever, n/v. CT head is neg for acute  problem. Pos for sinusitis. Plan- sudafed, decadron,  norco for pain,   Kathie Dike, PA-C 05/22/13 1121

## 2013-05-26 NOTE — ED Provider Notes (Signed)
Medical screening examination/treatment/procedure(s) were performed by non-physician practitioner and as supervising physician I was immediately available for consultation/collaboration.  Jaxyn Rout, MD 05/26/13 0103 

## 2013-07-16 ENCOUNTER — Other Ambulatory Visit: Payer: Self-pay | Admitting: *Deleted

## 2013-07-16 DIAGNOSIS — B20 Human immunodeficiency virus [HIV] disease: Secondary | ICD-10-CM

## 2013-07-16 MED ORDER — EFAVIRENZ-EMTRICITAB-TENOFOVIR 600-200-300 MG PO TABS: 1.0000 | ORAL_TABLET | Freq: Every day | ORAL | Status: DC

## 2013-08-18 ENCOUNTER — Other Ambulatory Visit: Payer: Self-pay | Admitting: Licensed Clinical Social Worker

## 2013-08-18 DIAGNOSIS — K219 Gastro-esophageal reflux disease without esophagitis: Secondary | ICD-10-CM

## 2013-08-18 MED ORDER — LANSOPRAZOLE 15 MG PO CPDR: 15.0000 mg | DELAYED_RELEASE_CAPSULE | Freq: Every day | ORAL | Status: DC

## 2013-08-24 ENCOUNTER — Other Ambulatory Visit: Payer: 59

## 2013-08-24 DIAGNOSIS — Z113 Encounter for screening for infections with a predominantly sexual mode of transmission: Secondary | ICD-10-CM

## 2013-08-24 DIAGNOSIS — B20 Human immunodeficiency virus [HIV] disease: Secondary | ICD-10-CM

## 2013-08-24 LAB — CBC
HCT: 41.1 % (ref 39.0–52.0)
MCHC: 34.5 g/dL (ref 30.0–36.0)
MCV: 80.3 fL (ref 78.0–100.0)
Platelets: 247 10*3/uL (ref 150–400)
RDW: 13.8 % (ref 11.5–15.5)
WBC: 6.3 10*3/uL (ref 4.0–10.5)

## 2013-08-24 LAB — COMPREHENSIVE METABOLIC PANEL
ALT: 25 U/L (ref 0–53)
AST: 19 U/L (ref 0–37)
Alkaline Phosphatase: 69 U/L (ref 39–117)
BUN: 13 mg/dL (ref 6–23)
Creat: 0.85 mg/dL (ref 0.50–1.35)

## 2013-08-25 LAB — HIV-1 RNA QUANT-NO REFLEX-BLD: HIV-1 RNA Quant, Log: 1.61 {Log} — ABNORMAL HIGH (ref <1.30)

## 2013-09-07 ENCOUNTER — Ambulatory Visit (INDEPENDENT_AMBULATORY_CARE_PROVIDER_SITE_OTHER): Payer: 59 | Admitting: Infectious Diseases

## 2013-09-07 ENCOUNTER — Encounter: Payer: Self-pay | Admitting: Infectious Diseases

## 2013-09-07 VITALS — BP 133/90 | HR 76 | Temp 97.6°F | Ht 66.0 in | Wt 175.0 lb

## 2013-09-07 DIAGNOSIS — Z113 Encounter for screening for infections with a predominantly sexual mode of transmission: Secondary | ICD-10-CM

## 2013-09-07 DIAGNOSIS — B20 Human immunodeficiency virus [HIV] disease: Secondary | ICD-10-CM

## 2013-09-07 DIAGNOSIS — Z23 Encounter for immunization: Secondary | ICD-10-CM

## 2013-09-07 DIAGNOSIS — B351 Tinea unguium: Secondary | ICD-10-CM

## 2013-09-07 DIAGNOSIS — Z79899 Other long term (current) drug therapy: Secondary | ICD-10-CM

## 2013-09-07 DIAGNOSIS — E119 Type 2 diabetes mellitus without complications: Secondary | ICD-10-CM

## 2013-09-07 NOTE — Assessment & Plan Note (Signed)
Doing well, will check his A1C at f/u. Has no si/sx except onychomycosis.

## 2013-09-07 NOTE — Assessment & Plan Note (Signed)
Offered to refer him to podiatry, he would like to continue his topical for now. I asked him to call if he changes his mind.

## 2013-09-07 NOTE — Progress Notes (Signed)
  Subjective:    Patient ID: Luis Gomez, male    DOB: 11-16-1955, 57 y.o.   MRN: [id number]  HPI 57 yo M HIV+, currently on [place]. He is being tx for GERD also.  Had colonoscopy 2007.  Does not follow FSG at home. On on medications, has been borderline.   HIV 1 RNA Quant (copies/mL)  Date Value  08/24/2013 41*  02/11/2013 <20   07/30/2012 <20      CD4 T Cell Abs (/uL)  Date Value  08/24/2013 870   02/11/2013 690   07/30/2012 770       Review of Systems  Constitutional: Negative for appetite change and unexpected weight change.  Gastrointestinal: Negative for diarrhea and constipation.  Genitourinary: Negative for difficulty urinating.  Neurological: Negative for numbness.       Objective:   Physical Exam  Constitutional: He appears well-developed and well-nourished.  HENT:  Mouth/Throat: No oropharyngeal exudate.  Eyes: EOM are normal. Pupils are equal, round, and reactive to light.  Neck: Neck supple.  Cardiovascular: Normal rate, regular rhythm and normal heart sounds.   Pulmonary/Chest: Effort normal and breath sounds normal.  Abdominal: Soft. Bowel sounds are normal. He exhibits no distension. There is no tenderness.  Lymphadenopathy:    He has no cervical adenopathy.  Skin:             Assessment & Plan:

## 2013-09-07 NOTE — Assessment & Plan Note (Signed)
Doing very well. Gets flu shot today. Given condoms, will see him back in 6 months.

## 2013-11-02 ENCOUNTER — Other Ambulatory Visit: Payer: Self-pay | Admitting: *Deleted

## 2013-11-02 DIAGNOSIS — B029 Zoster without complications: Secondary | ICD-10-CM

## 2013-11-02 MED ORDER — VALACYCLOVIR HCL 500 MG PO TABS: 500.0000 mg | ORAL_TABLET | Freq: Two times a day (BID) | ORAL | Status: DC

## 2013-12-30 ENCOUNTER — Other Ambulatory Visit: Payer: Self-pay | Admitting: Licensed Clinical Social Worker

## 2013-12-30 DIAGNOSIS — B20 Human immunodeficiency virus [HIV] disease: Secondary | ICD-10-CM

## 2013-12-30 MED ORDER — EFAVIRENZ-EMTRICITAB-TENOFOVIR 600-200-300 MG PO TABS: 1.0000 | ORAL_TABLET | Freq: Every day | ORAL | Status: DC

## 2014-01-28 ENCOUNTER — Ambulatory Visit (INDEPENDENT_AMBULATORY_CARE_PROVIDER_SITE_OTHER): Payer: 59 | Admitting: Otolaryngology

## 2014-03-09 ENCOUNTER — Other Ambulatory Visit (INDEPENDENT_AMBULATORY_CARE_PROVIDER_SITE_OTHER): Payer: 59

## 2014-03-09 DIAGNOSIS — Z79899 Other long term (current) drug therapy: Secondary | ICD-10-CM

## 2014-03-09 DIAGNOSIS — Z113 Encounter for screening for infections with a predominantly sexual mode of transmission: Secondary | ICD-10-CM

## 2014-03-09 DIAGNOSIS — E119 Type 2 diabetes mellitus without complications: Secondary | ICD-10-CM

## 2014-03-09 DIAGNOSIS — B20 Human immunodeficiency virus [HIV] disease: Secondary | ICD-10-CM

## 2014-03-09 LAB — CBC WITH DIFFERENTIAL/PLATELET
BASOS ABS: 0 10*3/uL (ref 0.0–0.1)
Basophils Relative: 1 % (ref 0–1)
EOS ABS: 0.3 10*3/uL (ref 0.0–0.7)
EOS PCT: 8 % — AB (ref 0–5)
HCT: 40.4 % (ref 39.0–52.0)
Hemoglobin: 13.2 g/dL (ref 13.0–17.0)
Lymphocytes Relative: 40 % (ref 12–46)
Lymphs Abs: 1.7 10*3/uL (ref 0.7–4.0)
MCH: 27.1 pg (ref 26.0–34.0)
MCHC: 32.7 g/dL (ref 30.0–36.0)
MCV: 83 fL (ref 78.0–100.0)
MONO ABS: 0.3 10*3/uL (ref 0.1–1.0)
Monocytes Relative: 8 % (ref 3–12)
Neutro Abs: 1.8 10*3/uL (ref 1.7–7.7)
Neutrophils Relative %: 43 % (ref 43–77)
PLATELETS: 260 10*3/uL (ref 150–400)
RBC: 4.87 MIL/uL (ref 4.22–5.81)
RDW: 13.9 % (ref 11.5–15.5)
WBC: 4.2 10*3/uL (ref 4.0–10.5)

## 2014-03-10 LAB — COMPLETE METABOLIC PANEL WITH GFR
ALT: 21 U/L (ref 0–53)
AST: 21 U/L (ref 0–37)
Albumin: 4.3 g/dL (ref 3.5–5.2)
Alkaline Phosphatase: 87 U/L (ref 39–117)
BILIRUBIN TOTAL: 0.3 mg/dL (ref 0.2–1.2)
BUN: 16 mg/dL (ref 6–23)
CO2: 24 meq/L (ref 19–32)
CREATININE: 0.77 mg/dL (ref 0.50–1.35)
Calcium: 9.1 mg/dL (ref 8.4–10.5)
Chloride: 104 mEq/L (ref 96–112)
GFR, Est Non African American: >89 mL/min
Glucose, Bld: 105 mg/dL — ABNORMAL HIGH (ref 70–99)
Potassium: 4.1 mEq/L (ref 3.5–5.3)
Sodium: 139 mEq/L (ref 135–145)
Total Protein: 6.6 g/dL (ref 6.0–8.3)

## 2014-03-10 LAB — HEMOGLOBIN A1C
Hgb A1c MFr Bld: 5.9 % — ABNORMAL HIGH (ref <5.7)
Mean Plasma Glucose: 123 mg/dL — ABNORMAL HIGH (ref <117)

## 2014-03-10 LAB — RPR

## 2014-03-10 LAB — LIPID PANEL
CHOLESTEROL: 168 mg/dL (ref 0–200)
HDL: 37 mg/dL — ABNORMAL LOW (ref >39)
LDL Cholesterol: 104 mg/dL — ABNORMAL HIGH (ref 0–99)
TRIGLYCERIDES: 133 mg/dL (ref <150)
Total CHOL/HDL Ratio: 4.5 Ratio
VLDL: 27 mg/dL (ref 0–40)

## 2014-03-10 LAB — T-HELPER CELL (CD4) - (RCID CLINIC ONLY)
CD4 % Helper T Cell: 35 % (ref 33–55)
CD4 T CELL ABS: 630 /uL (ref 400–2700)

## 2014-03-10 LAB — HIV-1 RNA QUANT-NO REFLEX-BLD: HIV 1 RNA Quant: <20 copies/mL (ref <20)

## 2014-03-24 ENCOUNTER — Encounter: Payer: Self-pay | Admitting: Infectious Diseases

## 2014-03-24 ENCOUNTER — Ambulatory Visit (INDEPENDENT_AMBULATORY_CARE_PROVIDER_SITE_OTHER): Payer: 59 | Admitting: Infectious Diseases

## 2014-03-24 VITALS — BP 120/78 | HR 76 | Temp 97.8°F | Wt 176.0 lb

## 2014-03-24 DIAGNOSIS — B351 Tinea unguium: Secondary | ICD-10-CM

## 2014-03-24 DIAGNOSIS — B20 Human immunodeficiency virus [HIV] disease: Secondary | ICD-10-CM

## 2014-03-24 DIAGNOSIS — B009 Herpesviral infection, unspecified: Secondary | ICD-10-CM

## 2014-03-24 DIAGNOSIS — E119 Type 2 diabetes mellitus without complications: Secondary | ICD-10-CM

## 2014-03-24 DIAGNOSIS — A609 Anogenital herpesviral infection, unspecified: Secondary | ICD-10-CM

## 2014-03-24 MED ORDER — TERBINAFINE HCL 250 MG PO TABS: 250.0000 mg | ORAL_TABLET | Freq: Every day | ORAL | Status: DC

## 2014-03-24 NOTE — Assessment & Plan Note (Signed)
Doing well. Will continue to f/u with PCP.

## 2014-03-24 NOTE — Assessment & Plan Note (Signed)
Wants to try lamisil.

## 2014-03-24 NOTE — Assessment & Plan Note (Signed)
He is doing very well. His vax are up to date. He is offered/given condoms. Will see him back in 6 months.

## 2014-03-24 NOTE — Assessment & Plan Note (Signed)
Will stop daily VTX and continue on prn basis only.

## 2014-03-24 NOTE — Progress Notes (Signed)
   Subjective:    Patient ID: Luis Gomez, male    DOB: 11-01-1956, 58 y.o.   MRN: [id number]  HPI 58 yo M with HIV+, on [place]. Also hx of GERD, elevated FSG. His current HgBA1C is < 6. Doing well, no problems eating, moving bowels.  Had colonoscopy in 2007.  Concerns about foot fungus, has tried topicals.   HIV 1 RNA Quant (copies/mL)  Date Value  03/09/2014 <20   08/24/2013 41*  02/11/2013 <20      CD4 T Cell Abs (/uL)  Date Value  03/09/2014 630   08/24/2013 870   02/11/2013 690     Review of Systems  Constitutional: Negative for appetite change and unexpected weight change.  Respiratory: Negative for cough and shortness of breath.   Gastrointestinal: Negative for diarrhea and constipation.  Genitourinary: Negative for difficulty urinating.       Objective:   Physical Exam  Constitutional: He appears well-developed and well-nourished.  HENT:  Mouth/Throat: No oropharyngeal exudate.  Eyes: EOM are normal. Pupils are equal, round, and reactive to light.  Neck: Neck supple.  Cardiovascular: Normal rate, regular rhythm and normal heart sounds.   Pulmonary/Chest: Effort normal and breath sounds normal.  Abdominal: Soft. Bowel sounds are normal. He exhibits no distension. There is no tenderness.  Lymphadenopathy:    He has no cervical adenopathy.          Assessment & Plan:

## 2014-04-19 ENCOUNTER — Telehealth: Payer: Self-pay | Admitting: *Deleted

## 2014-04-19 ENCOUNTER — Other Ambulatory Visit: Payer: Self-pay | Admitting: *Deleted

## 2014-04-19 DIAGNOSIS — B20 Human immunodeficiency virus [HIV] disease: Secondary | ICD-10-CM

## 2014-04-19 MED ORDER — EFAVIRENZ-EMTRICITAB-TENOFOVIR 600-200-300 MG PO TABS: 1.0000 | ORAL_TABLET | Freq: Every day | ORAL | Status: DC

## 2014-04-19 NOTE — Telephone Encounter (Signed)
Called the [employer].  Mr. Elta Guadeloupe was approved for $4000.00 grant. It is active until 04-19-15.

## 2014-06-28 ENCOUNTER — Other Ambulatory Visit (HOSPITAL_COMMUNITY): Payer: Self-pay | Admitting: Internal Medicine

## 2014-06-28 DIAGNOSIS — N509 Disorder of male genital organs, unspecified: Secondary | ICD-10-CM

## 2014-07-01 ENCOUNTER — Other Ambulatory Visit (HOSPITAL_COMMUNITY): Payer: Self-pay | Admitting: Internal Medicine

## 2014-07-01 ENCOUNTER — Ambulatory Visit (HOSPITAL_COMMUNITY)
Admission: RE | Admit: 2014-07-01 | Discharge: 2014-07-01 | Disposition: A | Discharge status: Home / Self Care | Payer: BC Managed Care – PPO | Source: Ambulatory Visit | Attending: Internal Medicine | Admitting: Internal Medicine

## 2014-07-01 DIAGNOSIS — N509 Disorder of male genital organs, unspecified: Secondary | ICD-10-CM

## 2014-07-01 DIAGNOSIS — N433 Hydrocele, unspecified: Secondary | ICD-10-CM | POA: Insufficient documentation

## 2014-07-01 DIAGNOSIS — N508 Other specified disorders of male genital organs: Secondary | ICD-10-CM | POA: Insufficient documentation

## 2014-09-27 ENCOUNTER — Encounter: Payer: Self-pay | Admitting: Infectious Diseases

## 2014-09-27 ENCOUNTER — Ambulatory Visit (INDEPENDENT_AMBULATORY_CARE_PROVIDER_SITE_OTHER): Payer: BC Managed Care – PPO | Admitting: Infectious Diseases

## 2014-09-27 VITALS — BP 120/76 | HR 81 | Temp 97.6°F | Wt 174.0 lb

## 2014-09-27 DIAGNOSIS — B351 Tinea unguium: Secondary | ICD-10-CM

## 2014-09-27 DIAGNOSIS — B20 Human immunodeficiency virus [HIV] disease: Secondary | ICD-10-CM

## 2014-09-27 DIAGNOSIS — R312 Other microscopic hematuria: Secondary | ICD-10-CM

## 2014-09-27 DIAGNOSIS — E1329 Other specified diabetes mellitus with other diabetic kidney complication: Secondary | ICD-10-CM

## 2014-09-27 DIAGNOSIS — K219 Gastro-esophageal reflux disease without esophagitis: Secondary | ICD-10-CM

## 2014-09-27 DIAGNOSIS — E1365 Other specified diabetes mellitus with hyperglycemia: Secondary | ICD-10-CM

## 2014-09-27 DIAGNOSIS — IMO0002 Reserved for concepts with insufficient information to code with codable children: Secondary | ICD-10-CM

## 2014-09-27 DIAGNOSIS — R3129 Other microscopic hematuria: Secondary | ICD-10-CM

## 2014-09-27 LAB — CBC
HEMATOCRIT: 40.8 % (ref 39.0–52.0)
Hemoglobin: 14.1 g/dL (ref 13.0–17.0)
MCH: 27.5 pg (ref 26.0–34.0)
MCHC: 34.6 g/dL (ref 30.0–36.0)
MCV: 79.7 fL (ref 78.0–100.0)
MPV: 8.5 fL — AB (ref 9.4–12.4)
PLATELETS: 283 10*3/uL (ref 150–400)
RBC: 5.12 MIL/uL (ref 4.22–5.81)
RDW: 13.2 % (ref 11.5–15.5)
WBC: 6.3 10*3/uL (ref 4.0–10.5)

## 2014-09-27 LAB — HEPATITIS B SURFACE ANTIBODY,QUALITATIVE: Hep B S Ab: NEGATIVE

## 2014-09-27 LAB — COMPREHENSIVE METABOLIC PANEL
ALBUMIN: 4.3 g/dL (ref 3.5–5.2)
ALK PHOS: 82 U/L (ref 39–117)
ALT: 20 U/L (ref 0–53)
AST: 19 U/L (ref 0–37)
BILIRUBIN TOTAL: 0.3 mg/dL (ref 0.2–1.2)
BUN: 18 mg/dL (ref 6–23)
CO2: 25 mEq/L (ref 19–32)
Calcium: 9.1 mg/dL (ref 8.4–10.5)
Chloride: 104 mEq/L (ref 96–112)
Creat: 0.92 mg/dL (ref 0.50–1.35)
GLUCOSE: 118 mg/dL — AB (ref 70–99)
POTASSIUM: 4.6 meq/L (ref 3.5–5.3)
SODIUM: 137 meq/L (ref 135–145)
TOTAL PROTEIN: 7 g/dL (ref 6.0–8.3)

## 2014-09-27 NOTE — Assessment & Plan Note (Signed)
Cr has been normal. Appreciate Uro eval. ? ACE-I

## 2014-09-27 NOTE — Progress Notes (Signed)
   Subjective:    Patient ID: Luis Gomez, male    DOB: 1956-02-23, 58 y.o.   MRN: [id number]  HPI 58 yo M with HIV+, on [place]. Also hx of GERD, elevated FSG.  Had colonoscopy in 2007.  At his previous visit he was started on lamisil. He quit this after 3 weeks due to heartburn. His nails did improve.   Has been taking his atripla without difficulty.  He is unclear what his FSG have been. Was seen by PCP and had hematuria. He has urology f/u appt.  No dysuria.  HIV 1 RNA QUANT (copies/mL)  Date Value  03/09/2014 <20  08/24/2013 41*  02/11/2013 <20   CD4 T CELL ABS  Date Value  03/09/2014 630 /uL  08/24/2013 870 /uL  02/11/2013 690 cmm      Review of Systems  HENT: Negative for trouble swallowing.   Gastrointestinal: Negative for diarrhea and constipation.  Genitourinary: Negative for difficulty urinating.       Objective:   Physical Exam  Constitutional: He appears well-developed and well-nourished.  HENT:  Mouth/Throat: No oropharyngeal exudate.  Eyes: EOM are normal. Pupils are equal, round, and reactive to light.  Neck: Neck supple.  Cardiovascular: Normal rate, regular rhythm and normal heart sounds.   Pulmonary/Chest: Effort normal and breath sounds normal.  Abdominal: Soft. Bowel sounds are normal. There is no tenderness. There is no rebound.  Lymphadenopathy:    He has no cervical adenopathy.          Assessment & Plan:

## 2014-09-27 NOTE — Assessment & Plan Note (Signed)
He is doing well. Has gotten flu, his pnvx is uptodate. will check his Hep B S Ab. Will repeat his labs, will see him back in 6 months.

## 2014-09-27 NOTE — Assessment & Plan Note (Signed)
will continue to f/u PCP. Will appreciate Uro eval.

## 2014-09-27 NOTE — Assessment & Plan Note (Signed)
Continues on PPI

## 2014-09-27 NOTE — Assessment & Plan Note (Signed)
Will hold on any further lamisil at this point.

## 2014-09-28 LAB — HIV-1 RNA QUANT-NO REFLEX-BLD

## 2014-09-28 LAB — T-HELPER CELL (CD4) - (RCID CLINIC ONLY)
CD4 % Helper T Cell: 29 % — ABNORMAL LOW (ref 33–55)
CD4 T Cell Abs: 790 /uL (ref 400–2700)

## 2014-10-17 ENCOUNTER — Other Ambulatory Visit: Payer: Self-pay | Admitting: Infectious Diseases

## 2014-11-26 ENCOUNTER — Ambulatory Visit: Payer: Self-pay | Admitting: Urology

## 2015-03-16 ENCOUNTER — Other Ambulatory Visit: Payer: BLUE CROSS/BLUE SHIELD

## 2015-03-16 DIAGNOSIS — Z79899 Other long term (current) drug therapy: Secondary | ICD-10-CM

## 2015-03-16 DIAGNOSIS — Z113 Encounter for screening for infections with a predominantly sexual mode of transmission: Secondary | ICD-10-CM

## 2015-03-16 DIAGNOSIS — B20 Human immunodeficiency virus [HIV] disease: Secondary | ICD-10-CM

## 2015-03-16 LAB — CBC WITH DIFFERENTIAL/PLATELET
BASOS ABS: 0.1 10*3/uL (ref 0.0–0.1)
Basophils Relative: 1 % (ref 0–1)
Eosinophils Absolute: 0.4 10*3/uL (ref 0.0–0.7)
Eosinophils Relative: 7 % — ABNORMAL HIGH (ref 0–5)
HEMATOCRIT: 41.2 % (ref 39.0–52.0)
HEMOGLOBIN: 13.3 g/dL (ref 13.0–17.0)
LYMPHS ABS: 2.4 10*3/uL (ref 0.7–4.0)
LYMPHS PCT: 48 % — AB (ref 12–46)
MCH: 26.9 pg (ref 26.0–34.0)
MCHC: 32.3 g/dL (ref 30.0–36.0)
MCV: 83.4 fL (ref 78.0–100.0)
MONO ABS: 0.4 10*3/uL (ref 0.1–1.0)
MONOS PCT: 8 % (ref 3–12)
MPV: 8.7 fL (ref 8.6–12.4)
NEUTROS ABS: 1.8 10*3/uL (ref 1.7–7.7)
NEUTROS PCT: 36 % — AB (ref 43–77)
Platelets: 255 10*3/uL (ref 150–400)
RBC: 4.94 MIL/uL (ref 4.22–5.81)
RDW: 13.6 % (ref 11.5–15.5)
WBC: 5 10*3/uL (ref 4.0–10.5)

## 2015-03-16 LAB — COMPLETE METABOLIC PANEL WITH GFR
ALT: 17 U/L (ref 0–53)
AST: 15 U/L (ref 0–37)
Albumin: 4.2 g/dL (ref 3.5–5.2)
Alkaline Phosphatase: 72 U/L (ref 39–117)
BUN: 11 mg/dL (ref 6–23)
CO2: 27 mEq/L (ref 19–32)
Calcium: 9 mg/dL (ref 8.4–10.5)
Chloride: 105 mEq/L (ref 96–112)
Creat: 0.77 mg/dL (ref 0.50–1.35)
GLUCOSE: 109 mg/dL — AB (ref 70–99)
POTASSIUM: 3.9 meq/L (ref 3.5–5.3)
Sodium: 140 mEq/L (ref 135–145)
Total Bilirubin: 0.5 mg/dL (ref 0.2–1.2)
Total Protein: 6.4 g/dL (ref 6.0–8.3)

## 2015-03-16 LAB — LIPID PANEL
CHOLESTEROL: 148 mg/dL (ref 0–200)
HDL: 35 mg/dL — ABNORMAL LOW (ref >=40)
LDL Cholesterol: 87 mg/dL (ref 0–99)
Total CHOL/HDL Ratio: 4.2 Ratio
Triglycerides: 128 mg/dL (ref <150)
VLDL: 26 mg/dL (ref 0–40)

## 2015-03-16 NOTE — Addendum Note (Signed)
Addended by: Dolan Amen D on: 03/16/2015 02:31 PM   Modules accepted: Orders

## 2015-03-17 LAB — RPR

## 2015-03-17 LAB — T-HELPER CELL (CD4) - (RCID CLINIC ONLY)
CD4 % Helper T Cell: 33 % (ref 33–55)
CD4 T CELL ABS: 860 /uL (ref 400–2700)

## 2015-03-19 LAB — HIV-1 RNA QUANT-NO REFLEX-BLD

## 2015-03-30 ENCOUNTER — Ambulatory Visit: Payer: BC Managed Care – PPO | Admitting: Infectious Diseases

## 2015-04-20 ENCOUNTER — Encounter: Payer: Self-pay | Admitting: Infectious Diseases

## 2015-04-20 ENCOUNTER — Ambulatory Visit (INDEPENDENT_AMBULATORY_CARE_PROVIDER_SITE_OTHER): Payer: BLUE CROSS/BLUE SHIELD | Admitting: Infectious Diseases

## 2015-04-20 VITALS — BP 128/78 | HR 81 | Temp 97.6°F | Wt 172.0 lb

## 2015-04-20 DIAGNOSIS — A609 Anogenital herpesviral infection, unspecified: Secondary | ICD-10-CM

## 2015-04-20 DIAGNOSIS — Z113 Encounter for screening for infections with a predominantly sexual mode of transmission: Secondary | ICD-10-CM

## 2015-04-20 DIAGNOSIS — B20 Human immunodeficiency virus [HIV] disease: Secondary | ICD-10-CM | POA: Diagnosis not present

## 2015-04-20 DIAGNOSIS — Z79899 Other long term (current) drug therapy: Secondary | ICD-10-CM

## 2015-04-20 DIAGNOSIS — Z23 Encounter for immunization: Secondary | ICD-10-CM

## 2015-04-20 DIAGNOSIS — R739 Hyperglycemia, unspecified: Secondary | ICD-10-CM | POA: Diagnosis not present

## 2015-04-20 NOTE — Assessment & Plan Note (Signed)
He is doing very well. He is given condoms.  We spoke about changing him to more renal and bone friendly regimen, he wishes to stay on atripla at this point.  Needs to restart Hep B series.  Will see him back in 6 months.  Lab Results  Component Value Date   HEPBSAB NEG 09/27/2014

## 2015-04-20 NOTE — Addendum Note (Signed)
Addended by: Myrtis Hopping A on: 04/20/2015 11:05 AM   Modules accepted: Orders

## 2015-04-20 NOTE — Assessment & Plan Note (Signed)
Will continue to monitor. Slightly improved.

## 2015-04-20 NOTE — Assessment & Plan Note (Signed)
He had recent outbreak, cont to take prn valtrex.  Will refill prn

## 2015-04-20 NOTE — Progress Notes (Signed)
   Subjective:    Patient ID: Luis Gomez, male    DOB: Jan 20, 1956, 59 y.o.   MRN: [id number]  HPI 59 yo M with HIV+, on [place]. Also hx of GERD, elevated FSG.  Had colonoscopy in 2007.   HIV 1 RNA QUANT (copies/mL)  Date Value  03/16/2015 <20  09/27/2014 <20  03/09/2014 <20   CD4 T CELL ABS (/uL)  Date Value  03/16/2015 860  09/27/2014 790  03/09/2014 630   Had PE 59 month ago, states he has been feeling well. No problems with vision. No problems with numbness in his hand or feet (except when he sleeps on that arm).  Atrpila has been going well- no missed doses.  Review of Systems  Constitutional: Negative for appetite change and unexpected weight change.  Gastrointestinal: Negative for diarrhea and constipation.  Genitourinary: Negative for difficulty urinating.  Neurological: Negative for numbness.       Objective:   Physical Exam  Constitutional: He appears well-developed and well-nourished.  HENT:  Mouth/Throat: No oropharyngeal exudate.  Eyes: EOM are normal. Pupils are equal, round, and reactive to light.  Neck: Neck supple.  Cardiovascular: Normal rate, regular rhythm and normal heart sounds.   Pulmonary/Chest: Effort normal and breath sounds normal.  Abdominal: Soft. Bowel sounds are normal.  Lymphadenopathy:    He has no cervical adenopathy.       Assessment & Plan:

## 2015-04-26 ENCOUNTER — Other Ambulatory Visit: Payer: Self-pay | Admitting: *Deleted

## 2015-04-26 ENCOUNTER — Telehealth: Payer: Self-pay | Admitting: *Deleted

## 2015-04-26 MED ORDER — EFAVIRENZ-EMTRICITAB-TENOFOVIR 600-200-300 MG PO TABS: 1.0000 | ORAL_TABLET | Freq: Every day | ORAL | Status: DC

## 2015-04-26 NOTE — Telephone Encounter (Signed)
Patient in to see Jasmine December for patient assistance with his medications. He has thought about the regimen change you suggested at his last appointment, would like to change.  Please advise on the new regimen.  Pam will file patient assistance for his Atripla, can send in a new prescription for the new regimen any time. Landis Gandy, RN

## 2015-05-02 ENCOUNTER — Other Ambulatory Visit: Payer: Self-pay | Admitting: Infectious Diseases

## 2015-05-02 DIAGNOSIS — B20 Human immunodeficiency virus [HIV] disease: Secondary | ICD-10-CM

## 2015-05-02 MED ORDER — ELVITEG-COBIC-EMTRICIT-TENOFAF 150-150-200-10 MG PO TABS: 1.0000 | ORAL_TABLET | Freq: Every day | ORAL | Status: DC

## 2015-05-02 NOTE — Telephone Encounter (Signed)
gnevoya order placed

## 2015-05-12 ENCOUNTER — Other Ambulatory Visit: Payer: Self-pay | Admitting: *Deleted

## 2015-05-12 DIAGNOSIS — B20 Human immunodeficiency virus [HIV] disease: Secondary | ICD-10-CM

## 2015-05-12 MED ORDER — ELVITEG-COBIC-EMTRICIT-TENOFAF 150-150-200-10 MG PO TABS: 1.0000 | ORAL_TABLET | Freq: Every day | ORAL | Status: DC

## 2015-05-12 NOTE — Telephone Encounter (Signed)
Patient in to see [facility] 7[facility]7; rx sent to outpatient pharmacy per Pam.  Thanks!

## 2015-05-23 ENCOUNTER — Ambulatory Visit (INDEPENDENT_AMBULATORY_CARE_PROVIDER_SITE_OTHER): Payer: BLUE CROSS/BLUE SHIELD | Admitting: *Deleted

## 2015-05-23 ENCOUNTER — Telehealth: Payer: Self-pay | Admitting: *Deleted

## 2015-05-23 DIAGNOSIS — B20 Human immunodeficiency virus [HIV] disease: Secondary | ICD-10-CM

## 2015-05-23 DIAGNOSIS — Z23 Encounter for immunization: Secondary | ICD-10-CM

## 2015-05-23 NOTE — Telephone Encounter (Signed)
Patient in to see nurse for Hep B injection.  He states he was advised to take 81mg  aspirin daily.  He would like to clear this with Dr. Johnnye Sima.  Please advise. Landis Gandy, RN

## 2015-05-24 ENCOUNTER — Other Ambulatory Visit: Payer: Self-pay | Admitting: *Deleted

## 2015-05-24 MED ORDER — ASPIRIN EC 81 MG PO TBEC: 81.0000 mg | DELAYED_RELEASE_TABLET | Freq: Every day | ORAL | Status: DC

## 2015-05-24 NOTE — Telephone Encounter (Signed)
rx sent.  Thanks.  

## 2015-05-24 NOTE — Telephone Encounter (Signed)
ok 

## 2015-10-10 ENCOUNTER — Encounter: Payer: Self-pay | Admitting: *Deleted

## 2015-10-10 ENCOUNTER — Ambulatory Visit (INDEPENDENT_AMBULATORY_CARE_PROVIDER_SITE_OTHER): Payer: BLUE CROSS/BLUE SHIELD | Admitting: *Deleted

## 2015-10-10 ENCOUNTER — Other Ambulatory Visit: Payer: BLUE CROSS/BLUE SHIELD

## 2015-10-10 DIAGNOSIS — Z23 Encounter for immunization: Secondary | ICD-10-CM

## 2015-10-10 DIAGNOSIS — B20 Human immunodeficiency virus [HIV] disease: Secondary | ICD-10-CM

## 2015-10-10 DIAGNOSIS — Z113 Encounter for screening for infections with a predominantly sexual mode of transmission: Secondary | ICD-10-CM

## 2015-10-10 DIAGNOSIS — Z79899 Other long term (current) drug therapy: Secondary | ICD-10-CM

## 2015-10-10 LAB — COMPREHENSIVE METABOLIC PANEL
ALT: 31 U/L (ref 9–46)
AST: 22 U/L (ref 10–35)
Albumin: 4.3 g/dL (ref 3.6–5.1)
Alkaline Phosphatase: 73 U/L (ref 40–115)
BUN: 15 mg/dL (ref 7–25)
CHLORIDE: 102 mmol/L (ref 98–110)
CO2: 28 mmol/L (ref 20–31)
Calcium: 8.7 mg/dL (ref 8.6–10.3)
Creat: 0.83 mg/dL (ref 0.70–1.33)
Glucose, Bld: 107 mg/dL — ABNORMAL HIGH (ref 65–99)
Potassium: 4.2 mmol/L (ref 3.5–5.3)
SODIUM: 140 mmol/L (ref 135–146)
Total Bilirubin: 0.4 mg/dL (ref 0.2–1.2)
Total Protein: 6.5 g/dL (ref 6.1–8.1)

## 2015-10-10 LAB — CBC
HEMATOCRIT: 40.3 % (ref 39.0–52.0)
Hemoglobin: 13.2 g/dL (ref 13.0–17.0)
MCH: 27.2 pg (ref 26.0–34.0)
MCHC: 32.8 g/dL (ref 30.0–36.0)
MCV: 82.9 fL (ref 78.0–100.0)
MPV: 8.7 fL (ref 8.6–12.4)
PLATELETS: 239 10*3/uL (ref 150–400)
RBC: 4.86 MIL/uL (ref 4.22–5.81)
RDW: 13.4 % (ref 11.5–15.5)
WBC: 6.9 10*3/uL (ref 4.0–10.5)

## 2015-10-10 LAB — LIPID PANEL
Cholesterol: 170 mg/dL (ref 125–200)
HDL: 39 mg/dL — AB (ref >=40)
LDL Cholesterol: 105 mg/dL (ref <130)
TRIGLYCERIDES: 132 mg/dL (ref <150)
Total CHOL/HDL Ratio: 4.4 Ratio (ref <=5.0)
VLDL: 26 mg/dL (ref <30)

## 2015-10-10 NOTE — Progress Notes (Signed)
Patient here for labs and to complete his Hepatitis B vaccinations.  Patient concerned, states through friend that he has a productive cough with small amounts of yellow phlegm.  He has not been sick in many years.  He denies a fever.  He has not tried any over the counter symptom relief.  RN advised patient to try to treat his symptoms, suggested ibuprofen, sudafed, and/or mucinex as directed on the box. Advised to use a humidifier at night, take hot showers, and drink fluids. Patient's friend asked if theraflu would be ok.  RN advised that would be fine alone, following the directions on the box. Patient's friend asked if drinking orange juice would be ok.  RN advised against that, as patient does have issues with hyperglycemia.  Patient is instructed to stick to water, but could use vitamin c powder if he felt that might help. Patient is instructed to call if he develops a fever or if his symptoms do not alleviate in 3 days. Patietn is scheduled to follow up with Dr. Johnnye Sima on 12/21.  He and his friend both verbalized understanding, agreement. Landis Gandy, RN

## 2015-10-11 LAB — RPR

## 2015-10-11 LAB — T-HELPER CELL (CD4) - (RCID CLINIC ONLY)
CD4 T CELL ABS: 560 /uL (ref 400–2700)
CD4 T CELL HELPER: 33 % (ref 33–55)

## 2015-10-12 LAB — HIV-1 RNA QUANT-NO REFLEX-BLD
HIV 1 RNA Quant: <20 copies/mL (ref <20)
HIV-1 RNA Quant, Log: <1.3 Log copies/mL (ref <1.30)

## 2015-10-26 ENCOUNTER — Encounter: Payer: Self-pay | Admitting: Infectious Diseases

## 2015-10-26 ENCOUNTER — Ambulatory Visit (INDEPENDENT_AMBULATORY_CARE_PROVIDER_SITE_OTHER): Payer: BLUE CROSS/BLUE SHIELD | Admitting: Infectious Diseases

## 2015-10-26 VITALS — BP 137/76 | HR 80 | Temp 97.5°F | Wt 180.0 lb

## 2015-10-26 DIAGNOSIS — Z79899 Other long term (current) drug therapy: Secondary | ICD-10-CM | POA: Diagnosis not present

## 2015-10-26 DIAGNOSIS — R739 Hyperglycemia, unspecified: Secondary | ICD-10-CM

## 2015-10-26 DIAGNOSIS — B20 Human immunodeficiency virus [HIV] disease: Secondary | ICD-10-CM | POA: Diagnosis not present

## 2015-10-26 DIAGNOSIS — Z113 Encounter for screening for infections with a predominantly sexual mode of transmission: Secondary | ICD-10-CM | POA: Diagnosis not present

## 2015-10-26 NOTE — Progress Notes (Signed)
   Subjective:    Patient ID: Luis Gomez, male    DOB: 04/07/1956, 59 y.o.   MRN: [id number]  HPI 59 yo M from [name] since [address], with HIV+, on [place]. Also hx of GERD, elevated FSG.  Had colonoscopy in 2007.  Was changed to genvoya at prev visit. Wishes to change to PM dosing.   HIV 1 RNA QUANT (copies/mL)  Date Value  10/10/2015 <20  03/16/2015 <20  09/27/2014 <20   CD4 T CELL ABS (/uL)  Date Value  10/10/2015 560  03/16/2015 860  09/27/2014 790   Wt up 5#, better appetite.   Review of Systems  Constitutional: Negative for appetite change and unexpected weight change.  Gastrointestinal: Negative for diarrhea and constipation.  Genitourinary: Negative for difficulty urinating.  Neurological: Negative for headaches.  Psychiatric/Behavioral: Negative for sleep disturbance.       Objective:   Physical Exam  Constitutional: He appears well-developed and well-nourished.  HENT:  Mouth/Throat: No oropharyngeal exudate.  Eyes: EOM are normal. Pupils are equal, round, and reactive to light.  Neck: Neck supple.  Cardiovascular: Normal rate, regular rhythm and normal heart sounds.   Pulmonary/Chest: Effort normal and breath sounds normal.  Abdominal: Soft. Bowel sounds are normal. There is no tenderness. There is no rebound.  Musculoskeletal: He exhibits no edema.  Lymphadenopathy:    He has no cervical adenopathy.      Assessment & Plan:

## 2015-10-26 NOTE — Assessment & Plan Note (Addendum)
He is doing very well.  Will get colon via his pcp He is given condoms, has gotten flu shot.  Will see him back in 6 months with labs prior.

## 2015-10-26 NOTE — Assessment & Plan Note (Signed)
Will f/ u with his PCP.  Slightly better.

## 2015-10-29 ENCOUNTER — Emergency Department (HOSPITAL_COMMUNITY): Payer: BLUE CROSS/BLUE SHIELD

## 2015-10-29 ENCOUNTER — Emergency Department (HOSPITAL_COMMUNITY)
Admission: EM | Admit: 2015-10-29 | Discharge: 2015-10-29 | Disposition: A | Discharge status: Home / Self Care | Payer: BLUE CROSS/BLUE SHIELD | Attending: Emergency Medicine | Admitting: Emergency Medicine

## 2015-10-29 ENCOUNTER — Encounter (HOSPITAL_COMMUNITY): Payer: Self-pay | Admitting: *Deleted

## 2015-10-29 DIAGNOSIS — Z7982 Long term (current) use of aspirin: Secondary | ICD-10-CM | POA: Insufficient documentation

## 2015-10-29 DIAGNOSIS — B20 Human immunodeficiency virus [HIV] disease: Secondary | ICD-10-CM | POA: Insufficient documentation

## 2015-10-29 DIAGNOSIS — J069 Acute upper respiratory infection, unspecified: Secondary | ICD-10-CM

## 2015-10-29 DIAGNOSIS — Z79899 Other long term (current) drug therapy: Secondary | ICD-10-CM | POA: Diagnosis not present

## 2015-10-29 DIAGNOSIS — K219 Gastro-esophageal reflux disease without esophagitis: Secondary | ICD-10-CM | POA: Diagnosis not present

## 2015-10-29 DIAGNOSIS — J40 Bronchitis, not specified as acute or chronic: Secondary | ICD-10-CM

## 2015-10-29 DIAGNOSIS — J209 Acute bronchitis, unspecified: Secondary | ICD-10-CM | POA: Diagnosis not present

## 2015-10-29 DIAGNOSIS — R05 Cough: Secondary | ICD-10-CM | POA: Diagnosis present

## 2015-10-29 MED ORDER — ONDANSETRON HCL 4 MG PO TABS
4.0000 mg | ORAL_TABLET | Freq: Once | ORAL | Status: AC
  Administered 2015-10-29: 4 mg via ORAL
  Filled 2015-10-29: qty 1

## 2015-10-29 MED ORDER — PREDNISONE 50 MG PO TABS
60.0000 mg | ORAL_TABLET | Freq: Once | ORAL | Status: AC
  Administered 2015-10-29: 60 mg via ORAL
  Filled 2015-10-29: qty 1

## 2015-10-29 MED ORDER — HYDROCOD POLST-CPM POLST ER 10-8 MG/5ML PO SUER
5.0000 mL | Freq: Once | ORAL | Status: AC
  Administered 2015-10-29: 5 mL via ORAL
  Filled 2015-10-29: qty 5

## 2015-10-29 MED ORDER — SULFAMETHOXAZOLE-TRIMETHOPRIM 800-160 MG PO TABS: 1.0000 | ORAL_TABLET | Freq: Two times a day (BID) | ORAL | Status: AC

## 2015-10-29 MED ORDER — HYDROCODONE-HOMATROPINE 5-1.5 MG/5ML PO SYRP: 5.0000 mL | ORAL_SOLUTION | Freq: Four times a day (QID) | ORAL | Status: DC | PRN

## 2015-10-29 MED ORDER — DEXAMETHASONE 4 MG PO TABS: 4.0000 mg | ORAL_TABLET | Freq: Two times a day (BID) | ORAL | Status: DC

## 2015-10-29 MED ORDER — SULFAMETHOXAZOLE-TRIMETHOPRIM 800-160 MG PO TABS
1.0000 | ORAL_TABLET | Freq: Once | ORAL | Status: AC
  Administered 2015-10-29: 1 via ORAL
  Filled 2015-10-29: qty 1

## 2015-10-29 NOTE — ED Notes (Signed)
Pt is here with chest congestion x4 days.  Pt has been taking mucinex at home without relief.  Pt states that he has had some coughing with minimal hard yellow phlegm that has come up. Pt denies any fever, cp or sob.  Pt is HIV positive so he is worried about being sick. Pt is followed regularly for the HIV and had his semi annual check on Wednesday and was told that "everythign is normal".  Pt is alert and oriented and appears in no acute distress.

## 2015-10-29 NOTE — ED Provider Notes (Signed)
CSN: [id number]     Arrival date & time 10/29/15  1025 History   First MD Initiated Contact with Patient 10/29/15 1038     Chief Complaint  Patient presents with  . URI     (Consider location/radiation/quality/duration/timing/severity/associated sxs/prior Treatment) Patient is a 59 y.o. male presenting with URI. The history is provided by the patient. The history is limited by a language barrier. A language interpreter was used.  URI Presenting symptoms: congestion and cough   Severity:  Moderate Onset quality:  Gradual Duration:  2 weeks Timing:  Intermittent Progression:  Worsening Relieved by:  Nothing Worsened by:  Nothing tried Associated symptoms: sinus pain   Associated symptoms: no arthralgias and no wheezing   Risk factors: immunosuppression   Risk factors: no recent travel   Risk factors comment:  HIV positive   Past Medical History  Diagnosis Date  . GERD (gastroesophageal reflux disease)     Last EGD->07/02/06 normal  . HIV disease ([place])   . S/P colonoscopy 07/02/2006    Left sided diverticula  . Diverticulosis   . Genital HSV    Past Surgical History  Procedure Laterality Date  . Back surgery    . Esophagogastroduodenoscopy  07/03/2006    Normal esophagus, stomach, D1, D2.  . Colonoscopy  07/03/2006    Normal rectum, left-sided diverticulum   No family history on file. Social History  Substance Use Topics  . Smoking status: Never Smoker   . Smokeless tobacco: Never Used  . Alcohol Use: No    Review of Systems  HENT: Positive for congestion and postnasal drip.   Respiratory: Positive for cough. Negative for shortness of breath and wheezing.   Musculoskeletal: Negative for arthralgias.  Skin: Negative for rash.  All other systems reviewed and are negative.     Allergies  Review of patient's allergies indicates no known allergies.  Home Medications   Prior to Admission medications   Medication Sig Start Date End Date Taking? Authorizing  Provider  aspirin EC 81 MG tablet Take 1 tablet (81 mg total) by mouth daily. 05/24/15  Yes Campbell Riches, MD  elvitegravir-cobicistat-emtricitabine-tenofovir ([name]) 150-150-200-10 MG TABS tablet Take 1 tablet by mouth daily with breakfast. 05/12/15  Yes Campbell Riches, MD  guaiFENesin (MUCINEX) 600 MG 12 hr tablet Take 600 mg by mouth 2 (two) times daily.   Yes Historical Provider, MD  lansoprazole (PREVACID) 15 MG capsule Take 1 capsule (15 mg total) by mouth daily. 08/18/13  Yes Campbell Riches, MD  Misc Natural Products [facility]) CAPS Take 1 capsule by mouth.   Yes Historical Provider, MD   BP 123/68 mmHg  Pulse 99  Temp(Src) 98 F (36.7 C) (Oral)  Resp 16  Ht 5\' 5"  (1.651 m)  Wt 81.647 kg  BMI 29.95 kg/m2  SpO2 98% Physical Exam  Constitutional: He is oriented to person, place, and time. He appears well-developed and well-nourished.  Non-toxic appearance.  HENT:  Head: Normocephalic.  Right Ear: Tympanic membrane and external ear normal.  Left Ear: Tympanic membrane and external ear normal.  Mouth/Throat: No oropharyngeal exudate.  Nasal congestion present.  Eyes: EOM and lids are normal. Pupils are equal, round, and reactive to light.  Neck: Normal range of motion. Neck supple. Carotid bruit is not present.  Cardiovascular: Normal rate, regular rhythm, normal heart sounds, intact distal pulses and normal pulses.   Pulmonary/Chest: Breath sounds normal. No respiratory distress.  course breath sounds. Symmetrical rise and fall of lthe chest. Few scattered  rhonchi. Pt speaks in complete sentences.  Abdominal: Soft. Bowel sounds are normal. There is no tenderness. There is no guarding.  Musculoskeletal: Normal range of motion.  Lymphadenopathy:       Head (right side): No submandibular adenopathy present.       Head (left side): No submandibular adenopathy present.    He has no cervical adenopathy.  Neurological: He is alert and oriented to person, place, and  time. He has normal strength. No cranial nerve deficit or sensory deficit.  Skin: Skin is warm and dry. No rash noted.  Psychiatric: He has a normal mood and affect. His speech is normal.  Nursing note and vitals reviewed.   ED Course  Procedures (including critical care time) Labs Review Labs Reviewed - No data to display  Imaging Review Dg Chest 2 View  10/29/2015  CLINICAL DATA:  59 year old male with cough and chest congestion for the past 4 days EXAM: CHEST  2 VIEW COMPARISON:  Prior chest x-ray 08/14/2010 FINDINGS: The lungs are clear and negative for focal airspace consolidation, pulmonary edema or suspicious pulmonary nodule. Stable mild bronchitic change. No pleural effusion or pneumothorax. Cardiac and mediastinal contours are within normal limits. No acute fracture or lytic or blastic osseous lesions. The visualized upper abdominal bowel gas pattern is unremarkable. IMPRESSION: No active cardiopulmonary disease. Electronically Signed   By: Jacqulynn Cadet M.D.   On: 10/29/2015 11:15   I have personally reviewed and evaluated these images and lab results as part of my medical decision-making.   EKG Interpretation None      MDM  Chest xray is negative for pulmonary nodules, pneumonia, or other acute changes. Pt is up to date on HIV meds and appointments. Rx for bactrim, decadron, and hycodan syrup given to the patient to help with symptoms or bronchitis.   Final diagnoses:  None    **I have reviewed nursing notes, vital signs, and all appropriate lab and imaging results for this patient.Lily Kocher, PA-C 10/31/15 [place], MD 11/02/15 [phone]

## 2015-10-29 NOTE — ED Notes (Signed)
Patient transported to X-ray 

## 2015-10-29 NOTE — Discharge Instructions (Signed)
Your chest xray suggest some bronchitis changes, but no pneumonia or other problems. Please increase fluids. Use septra and decadron daily with food. Use hycodan for cough and congestion. This medication may cause drowsiness, use with caution. Tos en los adultos (Cough, Adult) La tos es un reflejo que limpia la garganta y las vas respiratorias, y ayuda a la curacin y Health visitor de los pulmones. Es normal toser de Engineer, civil (consulting), pero cuando esta se presenta con otros sntomas o dura mucho tiempo puede ser el signo de una enfermedad que [place]. La tos puede durar solo 2 o 3semanas (aguda) o ms de 8semanas (crnica). CAUSAS Comnmente, las causas de la tos son las siguientes:  Advice worker sustancias que [employer]  Una infeccin respiratoria viral o bacteriana.  Alergias.  Asma.  Goteo posnasal.  Fumar.  El retroceso de cido estomacal hacia el esfago (reflujo gastroesofgico).  Algunos medicamentos.  Los problemas pulmonares crnicos, entre ellos, la enfermedad pulmonar obstructiva crnica (EPOC) (o, en contadas ocasiones, el cncer de pulmn).  Otras afecciones, como la insuficiencia cardaca. INSTRUCCIONES PARA EL CUIDADO EN EL HOGAR  Est atento a cualquier cambio en los sntomas. Tome estas medidas para Public house manager las molestias:  Tome los medicamentos solamente como se lo haya indicado el mdico.  Si le recetaron un antibitico, tmelo como se lo haya indicado el mdico. No deje de tomar los antibiticos aunque comience a sentirse mejor.  Hable con el mdico antes de tomar un antitusivo.  Beba suficiente lquido para Consulting civil engineer orina clara o de color amarillo plido.  Si el aire est seco, use un vaporizador o un humidificador con vapor fro en su habitacin o en su casa para ayudar a aflojar las secreciones.  Evite todas las cosas que le producen tos en el trabajo o en su casa.  Si la tos aumenta durante la noche, intente dormir  semisentado.  Evite el humo del cigarrillo. Si fuma, deje de hacerlo. Si necesita ayuda para dejar de fumar, consulte al mdico.  Evite la cafena.  Evite el alcohol.  Descanse todo lo que sea necesario. SOLICITE ATENCIN MDICA SI:   Aparecen nuevos sntomas.  Expectora pus al toser.  La tos no mejora despus de 2 o 3semanas, o empeora.  No puede controlar la tos con antitusivos y no puede dormir bien.  Tiene un dolor que se intensifica o que no puede Research scientist (life sciences).  Tiene fiebre.  Baja de peso sin causa aparente.  Tiene transpiracin nocturna. SOLICITE ATENCIN MDICA DE INMEDIATO SI:  Tose y escupe sangre.  Tiene dificultad para respirar.  Los latidos cardacos son muy rpidos.   Esta informacin no tiene Marine scientist el consejo del mdico. Asegrese de hacerle al mdico cualquier pregunta que tenga.   Document Released: 05/30/2011 Document Revised: 07/13/2015 Elsevier Interactive Patient Education [employer].

## 2015-11-09 MED FILL — GENVOYA TABLET: 150-150-200 | 30 days supply | Qty: 30 | Fill #6

## 2015-12-09 MED FILL — GENVOYA TABLET: 150-150-200 | 30 days supply | Qty: 30 | Fill #7

## 2016-01-09 MED FILL — GENVOYA TABLET: 150-150-200 | 30 days supply | Qty: 30 | Fill #8

## 2016-02-07 MED FILL — GENVOYA TABLET: 150-150-200 | 30 days supply | Qty: 30 | Fill #9

## 2016-03-08 MED FILL — GENVOYA TABLET: 150-150-200 | 30 days supply | Qty: 30 | Fill #10

## 2016-04-06 MED FILL — GENVOYA TABLET: 150-150-200 | 30 days supply | Qty: 30 | Fill #11

## 2016-04-09 ENCOUNTER — Other Ambulatory Visit: Payer: BLUE CROSS/BLUE SHIELD

## 2016-04-09 DIAGNOSIS — B20 Human immunodeficiency virus [HIV] disease: Secondary | ICD-10-CM

## 2016-04-09 DIAGNOSIS — Z79899 Other long term (current) drug therapy: Secondary | ICD-10-CM

## 2016-04-09 DIAGNOSIS — Z113 Encounter for screening for infections with a predominantly sexual mode of transmission: Secondary | ICD-10-CM

## 2016-04-09 LAB — COMPREHENSIVE METABOLIC PANEL
ALBUMIN: 4.5 g/dL (ref 3.6–5.1)
ALT: 24 U/L (ref 9–46)
AST: 17 U/L (ref 10–35)
Alkaline Phosphatase: 62 U/L (ref 40–115)
BUN: 20 mg/dL (ref 7–25)
CHLORIDE: 102 mmol/L (ref 98–110)
CO2: 27 mmol/L (ref 20–31)
Calcium: 9.2 mg/dL (ref 8.6–10.3)
Creat: 1.02 mg/dL (ref 0.70–1.33)
Glucose, Bld: 102 mg/dL — ABNORMAL HIGH (ref 65–99)
POTASSIUM: 4.3 mmol/L (ref 3.5–5.3)
Sodium: 139 mmol/L (ref 135–146)
TOTAL PROTEIN: 6.7 g/dL (ref 6.1–8.1)
Total Bilirubin: 0.6 mg/dL (ref 0.2–1.2)

## 2016-04-09 LAB — LIPID PANEL
CHOL/HDL RATIO: 4.9 ratio (ref <=5.0)
CHOLESTEROL: 209 mg/dL — AB (ref 125–200)
HDL: 43 mg/dL (ref >=40)
LDL Cholesterol: 133 mg/dL — ABNORMAL HIGH (ref <130)
TRIGLYCERIDES: 167 mg/dL — AB (ref <150)
VLDL: 33 mg/dL — AB (ref <30)

## 2016-04-09 LAB — CBC
HCT: 45 % (ref 38.5–50.0)
HEMOGLOBIN: 14.5 g/dL (ref 13.2–17.1)
MCH: 26.9 pg — AB (ref 27.0–33.0)
MCHC: 32.2 g/dL (ref 32.0–36.0)
MCV: 83.5 fL (ref 80.0–100.0)
MPV: 8.9 fL (ref 7.5–12.5)
Platelets: 270 10*3/uL (ref 140–400)
RBC: 5.39 MIL/uL (ref 4.20–5.80)
RDW: 13.8 % (ref 11.0–15.0)
WBC: 5.3 10*3/uL (ref 3.8–10.8)

## 2016-04-10 LAB — T-HELPER CELL (CD4) - (RCID CLINIC ONLY)
CD4 % Helper T Cell: 38 % (ref 33–55)
CD4 T Cell Abs: 760 /uL (ref 400–2700)

## 2016-04-10 LAB — HIV-1 RNA QUANT-NO REFLEX-BLD
HIV 1 RNA Quant: <20 copies/mL (ref <20)
HIV-1 RNA Quant, Log: <1.3 Log copies/mL (ref <1.30)

## 2016-04-10 LAB — RPR

## 2016-04-23 ENCOUNTER — Ambulatory Visit (INDEPENDENT_AMBULATORY_CARE_PROVIDER_SITE_OTHER): Payer: BLUE CROSS/BLUE SHIELD | Admitting: Infectious Diseases

## 2016-04-23 ENCOUNTER — Encounter: Payer: Self-pay | Admitting: Infectious Diseases

## 2016-04-23 VITALS — BP 127/80 | HR 69 | Temp 97.7°F | Wt 181.0 lb

## 2016-04-23 DIAGNOSIS — Z113 Encounter for screening for infections with a predominantly sexual mode of transmission: Secondary | ICD-10-CM | POA: Diagnosis not present

## 2016-04-23 DIAGNOSIS — A609 Anogenital herpesviral infection, unspecified: Secondary | ICD-10-CM

## 2016-04-23 DIAGNOSIS — R739 Hyperglycemia, unspecified: Secondary | ICD-10-CM

## 2016-04-23 DIAGNOSIS — B20 Human immunodeficiency virus [HIV] disease: Secondary | ICD-10-CM

## 2016-04-23 MED ORDER — VALACYCLOVIR HCL 500 MG PO TABS: 500.0000 mg | ORAL_TABLET | Freq: Two times a day (BID) | ORAL | Status: DC

## 2016-04-23 NOTE — Assessment & Plan Note (Signed)
Given condoms Doing very well vax are up to date.  Appreciate PCP f/u.  Will see him back in 6 months.

## 2016-04-23 NOTE — Assessment & Plan Note (Signed)
Last Glc normal.  Not on rx.

## 2016-04-23 NOTE — Progress Notes (Signed)
   Subjective:    Patient ID: Luis Gomez, male    DOB: 14-Jan-1956, 60 y.o.   MRN: [id number]  HPI 60 yo M from [place] in [name] since 1982, with HIV+, on atripla. Also hx of GERD, elevated FSG.  Had colonoscopy in 2007.  Was changed to genvoya 04-2015  HIV 1 RNA QUANT (copies/mL)  Date Value  04/09/2016 <20  10/10/2015 <20  03/16/2015 <20   CD4 T CELL ABS (/uL)  Date Value  04/09/2016 760  10/10/2015 560  03/16/2015 860    He has been feeeling well.  No problems with medications.  Denies missed ART.  Has PCP, can't remember name.  Has had trouble with sleep, due to work goes to bed at 1am. No problem going to sleep.  Feels like he is having a recurrence of his HSV- having burning.    Review of Systems  Constitutional: Negative for appetite change and unexpected weight change.  Respiratory: Negative for cough and shortness of breath.   Gastrointestinal: Negative for diarrhea and constipation.  Genitourinary: Negative for difficulty urinating.  Neurological: Negative for headaches.       Objective:   Physical Exam  Constitutional: He appears well-developed and well-nourished.  HENT:  Mouth/Throat: No oropharyngeal exudate.  Eyes: EOM are normal. Pupils are equal, round, and reactive to light.  Neck: Neck supple.  Cardiovascular: Normal rate, regular rhythm and normal heart sounds.   Pulmonary/Chest: Effort normal and breath sounds normal.  Abdominal: Soft. Bowel sounds are normal. There is no tenderness. There is no rebound.  Musculoskeletal: He exhibits no edema.  Lymphadenopathy:    He has no cervical adenopathy.          Assessment & Plan:

## 2016-04-23 NOTE — Assessment & Plan Note (Signed)
Will give him a rx for valtrex. 500mg  bid for 3 days, 2 refill.

## 2016-05-01 ENCOUNTER — Encounter (HOSPITAL_COMMUNITY): Payer: Self-pay | Admitting: Emergency Medicine

## 2016-05-01 ENCOUNTER — Emergency Department (HOSPITAL_COMMUNITY)
Admission: EM | Admit: 2016-05-01 | Discharge: 2016-05-01 | Disposition: A | Discharge status: Home / Self Care | Payer: BLUE CROSS/BLUE SHIELD | Attending: Emergency Medicine | Admitting: Emergency Medicine

## 2016-05-01 DIAGNOSIS — N4889 Other specified disorders of penis: Secondary | ICD-10-CM | POA: Insufficient documentation

## 2016-05-01 DIAGNOSIS — X58XXXA Exposure to other specified factors, initial encounter: Secondary | ICD-10-CM | POA: Insufficient documentation

## 2016-05-01 DIAGNOSIS — Z79899 Other long term (current) drug therapy: Secondary | ICD-10-CM | POA: Diagnosis not present

## 2016-05-01 DIAGNOSIS — Y939 Activity, unspecified: Secondary | ICD-10-CM | POA: Insufficient documentation

## 2016-05-01 DIAGNOSIS — Z7982 Long term (current) use of aspirin: Secondary | ICD-10-CM | POA: Insufficient documentation

## 2016-05-01 DIAGNOSIS — Y999 Unspecified external cause status: Secondary | ICD-10-CM | POA: Diagnosis not present

## 2016-05-01 DIAGNOSIS — Y929 Unspecified place or not applicable: Secondary | ICD-10-CM | POA: Diagnosis not present

## 2016-05-01 DIAGNOSIS — S3131XA Laceration without foreign body of scrotum and testes, initial encounter: Secondary | ICD-10-CM

## 2016-05-01 MED ORDER — TETANUS-DIPHTH-ACELL PERTUSSIS 5-2.5-18.5 LF-MCG/0.5 IM SUSP
0.5000 mL | Freq: Once | INTRAMUSCULAR | Status: AC
  Administered 2016-05-01: 0.5 mL via INTRAMUSCULAR
  Filled 2016-05-01: qty 0.5

## 2016-05-01 NOTE — ED Notes (Signed)
Pt got scrotum caught in zipper. 1cm area to the center of the scrotum. Bleeding controled.

## 2016-05-01 NOTE — ED Provider Notes (Signed)
CSN: [id number]     Arrival date & time 05/01/16  [id number] History   First MD Initiated Contact with Patient 05/01/16 [phone]     Chief Complaint  Patient presents with  . Penis Pain     (Consider location/radiation/quality/duration/timing/severity/associated sxs/prior Treatment) Patient is a 60 y.o. male presenting with penile pain. The history is provided by the patient.  Penis Pain  He caught his scrotum in his upper and causing a laceration. He does not know when his last tetanus immunization was.  Past Medical History  Diagnosis Date  . GERD (gastroesophageal reflux disease)     Last EGD->07/02/06 normal  . HIV disease ([place])   . S/P colonoscopy 07/02/2006    Left sided diverticula  . Diverticulosis   . Genital HSV    Past Surgical History  Procedure Laterality Date  . Back surgery    . Esophagogastroduodenoscopy  07/03/2006    Normal esophagus, stomach, D1, D2.  . Colonoscopy  07/03/2006    Normal rectum, left-sided diverticulum   History reviewed. No pertinent family history. Social History  Substance Use Topics  . Smoking status: Never Smoker   . Smokeless tobacco: Never Used  . Alcohol Use: No    Review of Systems  Genitourinary: Positive for penile pain.  All other systems reviewed and are negative.     Allergies  Review of patient's allergies indicates no known allergies.  Home Medications   Prior to Admission medications   Medication Sig Start Date End Date Taking? Authorizing Provider  aspirin EC 81 MG tablet Take 1 tablet (81 mg total) by mouth daily. 05/24/15  Yes Campbell Riches, MD  elvitegravir-cobicistat-emtricitabine-tenofovir ([name]) 150-150-200-10 MG TABS tablet Take 1 tablet by mouth daily with breakfast. 05/12/15  Yes Campbell Riches, MD  dexamethasone (DECADRON) 4 MG tablet Take 1 tablet (4 mg total) by mouth 2 (two) times daily with a meal. Patient not taking: Reported on 04/23/2016 10/29/15   Lily Kocher, PA-C  guaiFENesin (MUCINEX) 600  MG 12 hr tablet Take 600 mg by mouth 2 (two) times daily. Reported on 04/23/2016    Historical Provider, MD  HYDROcodone-homatropine (HYCODAN) 5-1.5 MG/5ML syrup Take 5 mLs by mouth every 6 (six) hours as needed. Patient not taking: Reported on 04/23/2016 10/29/15   Lily Kocher, PA-C  lansoprazole (PREVACID) 15 MG capsule Take 1 capsule (15 mg total) by mouth daily. Patient not taking: Reported on 04/23/2016 08/18/13   Campbell Riches, MD  Misc Natural Products [facility]) CAPS Take 1 capsule by mouth. Reported on 04/23/2016    Historical Provider, MD  valACYclovir (VALTREX) 500 MG tablet Take 1 tablet (500 mg total) by mouth 2 (two) times daily. 04/23/16   Campbell Riches, MD   BP 122/77 mmHg  Pulse 83  Temp(Src) 97.9 F (36.6 C) (Oral)  Resp 20  Ht 5\' 6"  (1.676 m)  Wt 180 lb (81.647 kg)  BMI 29.07 kg/m2  SpO2 95% Physical Exam  Nursing note and vitals reviewed.  60 year old male, resting comfortably and in no acute distress. Vital signs are normal. Oxygen saturation is 95%, which is normal. Head is normocephalic and atraumatic. PERRLA, EOMI. Oropharynx is clear. Neck is nontender and supple without adenopathy or JVD. Back is nontender and there is no CVA tenderness. Lungs are clear without rales, wheezes, or rhonchi. Chest is nontender. Heart has regular rate and rhythm without murmur. Abdomen is soft, flat, nontender without masses or hepatosplenomegaly and peristalsis is normoactive. Genitalia: Uncircumcised penis. Testes descended  without masses. Laceration of the superior aspect of the scrotum near the midline. Laceration does not penetrate into the scrotal sac. No inguinal adenopathy. Extremities have no cyanosis or edema, full range of motion is present. Skin is warm and dry without rash. Neurologic: Mental status is normal, cranial nerves are intact, there are no motor or sensory deficits.  ED Course  Procedures (including critical care time) LACERATION  REPAIR Performed by: [id number] Authorized by: [id number] Consent: Verbal consent obtained. Risks and benefits: risks, benefits and alternatives were discussed Consent given by: patient Patient identity confirmed: provided demographic data Prepped and Draped in normal sterile fashion Wound explored  Laceration Location: Scrotum  Laceration Length: 3 cm  No Foreign Bodies seen or palpated  Anesthesia: None   Amount of cleaning: standard  Skin closure: Close   Technique: Tissue adhesive   Patient tolerance: Patient tolerated the procedure well with no immediate complications.   MDM   Final diagnoses:  Laceration of scrotum, initial encounter    Scrotal laceration which is closed with tissue adhesive. He is given Tdap booster and sent home with tissue adhesive instructions.    Delora Fuel, MD [phone]

## 2016-05-01 NOTE — Discharge Instructions (Signed)
Cuidados de una herida tratada con Sherlyn Lees para tejidos (Tissue Adhesive Wound Care) Algunos cortes, laceraciones e incisiones pueden repararse con un adhesivo para tejidos. El [place] para tejidos es como cola de Print production planner. Mantiene la piel unida para una ms rpida curacin. En aproximadamente un minuto, adhiere con firmeza la piel, y alcanza su fuerza mxima en alrededor [employer] minutos y [place]. El [place] desaparece naturalmente cuando la herida se [place]. Es importante cuidar adecuadamente de su herida en el hogar mientras se [place].  INSTRUCCIONES PARA EL CUIDADO EN EL HOGAR   Estn permitidas las duchas. No remoje la zona que tiene el [place] del tejido. No tome baos, no practique natacin ni utilice el jacuzzi. No use jabones ni ungentos sobre la herida. Algunos ungentos pueden debilitar el pegamento.  Si le han aplicado un vendaje, siga las indicaciones del mdico para saber con qu frecuencia debe cambiarlo.   Si le han aplicado un vendaje, mantngalo seco.   No rasque, no frote ni raspe la herida.   No coloque cinta sobre el [place]. El [place] podra despegarse al quitar la cinta.   Proteja la herida de nuevas lesiones [employer] se cure.   Proteja la herida del sol y no la exponga a Oceanographer dure el proceso de curacin y durante algunas semanas despus de la curacin.   Tome slo medicamentos de venta libre o recetados, segn las indicaciones del mdico.   Cumpla con todas las visitas de control, segn le indique su mdico. SOLICITE ATENCIN MDICA DE INMEDIATO SI:   La herida se ve roja, hinchada, est caliente o le duele.   Aparece un sarpullido una vez aplicado el pegamento.  Siente mucho dolor en la herida.   Hay rayas rojas que salen de la herida.   Supura pus.   Observa un aumento de Engineer, technical sales.  Tiene fiebre.  Comienza a sentir escalofros.   Advierte un olor ftido que proviene de la herida.   La herida o el [place] se  abren.  ASEGRESE DE QUE:   Comprende estas instrucciones.  Controlar su afeccin.  Recibir ayuda de inmediato si no mejora o si empeora.   Esta informacin no tiene Marine scientist el consejo del mdico. Asegrese de hacerle al mdico cualquier pregunta que tenga.   Document Released: 10/22/2005 Document Revised: 08/12/2013 Elsevier Interactive Patient Education [employer].

## 2016-05-01 NOTE — ED Notes (Signed)
Pt caught penis in zipper last night.

## 2016-05-01 NOTE — ED Notes (Signed)
Pt alert & oriented x4, stable gait. Patient given discharge instructions, paperwork & prescription(s). Patient  instructed to stop at the registration desk to finish any additional paperwork. Patient verbalized understanding. Pt left department w/ no further questions. 

## 2016-05-07 ENCOUNTER — Other Ambulatory Visit: Payer: Self-pay | Admitting: Infectious Diseases

## 2016-05-07 DIAGNOSIS — B20 Human immunodeficiency virus [HIV] disease: Secondary | ICD-10-CM

## 2016-05-07 MED FILL — GENVOYA TABLET: 150-150-200 | 30 days supply | Qty: 30 | Fill #0

## 2016-06-05 MED FILL — GENVOYA TABLET: 150-150-200 | 30 days supply | Qty: 30 | Fill #1

## 2016-07-04 ENCOUNTER — Telehealth: Payer: Self-pay | Admitting: Internal Medicine

## 2016-07-04 MED FILL — GENVOYA TABLET: 150-150-200 | 30 days supply | Qty: 30 | Fill #2

## 2016-07-04 NOTE — Telephone Encounter (Signed)
Letter mailed

## 2016-07-04 NOTE — Telephone Encounter (Signed)
RECALL FOR TCS °

## 2016-08-02 MED FILL — GENVOYA TABLET: 150-150-200 | 30 days supply | Qty: 30 | Fill #3

## 2016-08-30 MED FILL — GENVOYA TABLET: 150-150-200 | 30 days supply | Qty: 30 | Fill #4

## 2016-09-28 MED FILL — GENVOYA TABLET: 150-150-200 | 30 days supply | Qty: 30 | Fill #5

## 2016-10-10 ENCOUNTER — Other Ambulatory Visit: Payer: BLUE CROSS/BLUE SHIELD

## 2016-10-24 ENCOUNTER — Ambulatory Visit: Payer: BLUE CROSS/BLUE SHIELD | Admitting: Infectious Diseases

## 2016-11-02 MED FILL — GENVOYA TABLET: 150-150-200 | 30 days supply | Qty: 30 | Fill #6

## 2016-11-08 ENCOUNTER — Ambulatory Visit: Payer: BLUE CROSS/BLUE SHIELD | Admitting: Infectious Diseases

## 2016-11-14 ENCOUNTER — Ambulatory Visit (INDEPENDENT_AMBULATORY_CARE_PROVIDER_SITE_OTHER): Payer: BLUE CROSS/BLUE SHIELD | Admitting: Infectious Diseases

## 2016-11-14 ENCOUNTER — Encounter: Payer: Self-pay | Admitting: Infectious Diseases

## 2016-11-14 ENCOUNTER — Other Ambulatory Visit (HOSPITAL_COMMUNITY)
Admission: RE | Admit: 2016-11-14 | Discharge: 2016-11-14 | Disposition: A | Discharge status: Home / Self Care | Payer: BLUE CROSS/BLUE SHIELD | Source: Ambulatory Visit | Attending: Infectious Diseases | Admitting: Infectious Diseases

## 2016-11-14 VITALS — BP 123/80 | HR 66 | Temp 98.7°F | Ht 66.0 in | Wt 182.8 lb

## 2016-11-14 DIAGNOSIS — A609 Anogenital herpesviral infection, unspecified: Secondary | ICD-10-CM

## 2016-11-14 DIAGNOSIS — Z113 Encounter for screening for infections with a predominantly sexual mode of transmission: Secondary | ICD-10-CM | POA: Diagnosis not present

## 2016-11-14 DIAGNOSIS — Z79899 Other long term (current) drug therapy: Secondary | ICD-10-CM

## 2016-11-14 DIAGNOSIS — B20 Human immunodeficiency virus [HIV] disease: Secondary | ICD-10-CM | POA: Diagnosis not present

## 2016-11-14 LAB — COMPREHENSIVE METABOLIC PANEL
ALBUMIN: 4.5 g/dL (ref 3.6–5.1)
ALK PHOS: 66 U/L (ref 40–115)
ALT: 52 U/L — AB (ref 9–46)
AST: 27 U/L (ref 10–35)
BUN: 14 mg/dL (ref 7–25)
CALCIUM: 9.1 mg/dL (ref 8.6–10.3)
CO2: 25 mmol/L (ref 20–31)
Chloride: 103 mmol/L (ref 98–110)
Creat: 0.99 mg/dL (ref 0.70–1.33)
Glucose, Bld: 108 mg/dL — ABNORMAL HIGH (ref 65–99)
POTASSIUM: 4.3 mmol/L (ref 3.5–5.3)
Sodium: 139 mmol/L (ref 135–146)
Total Bilirubin: 0.5 mg/dL (ref 0.2–1.2)
Total Protein: 6.9 g/dL (ref 6.1–8.1)

## 2016-11-14 LAB — CBC
HEMATOCRIT: 43.4 % (ref 38.5–50.0)
Hemoglobin: 14.1 g/dL (ref 13.2–17.1)
MCH: 27.6 pg (ref 27.0–33.0)
MCHC: 32.5 g/dL (ref 32.0–36.0)
MCV: 84.9 fL (ref 80.0–100.0)
MPV: 8.7 fL (ref 7.5–12.5)
Platelets: 256 10*3/uL (ref 140–400)
RBC: 5.11 MIL/uL (ref 4.20–5.80)
RDW: 13.4 % (ref 11.0–15.0)
WBC: 4.3 10*3/uL (ref 3.8–10.8)

## 2016-11-14 LAB — LIPID PANEL
CHOL/HDL RATIO: 5.8 ratio — AB (ref <5.0)
CHOLESTEROL: 225 mg/dL — AB (ref <200)
HDL: 39 mg/dL — AB (ref >40)
LDL Cholesterol: 163 mg/dL — ABNORMAL HIGH (ref <100)
TRIGLYCERIDES: 114 mg/dL (ref <150)
VLDL: 23 mg/dL (ref <30)

## 2016-11-14 MED ORDER — VALACYCLOVIR HCL 500 MG PO TABS: 500.0000 mg | ORAL_TABLET | Freq: Two times a day (BID) | ORAL | 2 refills | Status: DC

## 2016-11-14 NOTE — Assessment & Plan Note (Signed)
Will refill his valtrex.

## 2016-11-14 NOTE — Progress Notes (Signed)
   Subjective:    Patient ID: Luis Gomez, male    DOB: [birth year], 61 y.o.   MRN: [id number]  HPI 61 yo M from [place] in [name] since 1982, with HIV+, on atripla. Also hx of GERD, elevated FSG.  Had colonoscopy in 2007.  Was changed to genvoya 04-2015 Has not had colon. States he will f/u with this.  Has had no problems with his ART. Has been feeling well.  Would like VTX refill as he still has periodic outbreaks.   HIV 1 RNA Quant (copies/mL)  Date Value  04/09/2016 <20  10/10/2015 <20  03/16/2015 <20   CD4 T Cell Abs (/uL)  Date Value  04/09/2016 760  10/10/2015 560  03/16/2015 860    Review of Systems  Constitutional: Negative for appetite change and unexpected weight change.  Respiratory: Negative for shortness of breath.   Gastrointestinal: Negative for constipation and diarrhea.  Genitourinary: Negative for difficulty urinating.       Objective:   Physical Exam  Constitutional: He appears well-developed and well-nourished.  HENT:  Mouth/Throat: No oropharyngeal exudate.  Eyes: EOM are normal. Pupils are equal, round, and reactive to light.  Neck: Neck supple.  Cardiovascular: Normal rate, regular rhythm and normal heart sounds.   Pulmonary/Chest: Effort normal and breath sounds normal.  Abdominal: Soft. Bowel sounds are normal. There is no tenderness. There is no rebound.  Musculoskeletal: He exhibits no edema.  Lymphadenopathy:    He has no cervical adenopathy.      Assessment & Plan:

## 2016-11-14 NOTE — Assessment & Plan Note (Addendum)
Will check his labs today.  He is doing well Has gotten flu shot.  Aged out of [employer].  Will see him back in 6 months.

## 2016-11-15 LAB — URINE CYTOLOGY ANCILLARY ONLY
CHLAMYDIA, DNA PROBE: NEGATIVE
NEISSERIA GONORRHEA: NEGATIVE

## 2016-11-15 LAB — T-HELPER CELL (CD4) - (RCID CLINIC ONLY)
CD4 % Helper T Cell: 34 % (ref 33–55)
CD4 T CELL ABS: 580 /uL (ref 400–2700)

## 2016-11-15 LAB — RPR

## 2016-11-19 LAB — HIV-1 RNA QUANT-NO REFLEX-BLD
HIV 1 RNA Quant: <20 copies/mL (ref <20)
HIV-1 RNA Quant, Log: <1.3 Log copies/mL (ref <1.30)

## 2016-12-03 MED FILL — GENVOYA TABLET: 150-150-200 | 30 days supply | Qty: 30 | Fill #7 | Status: TO

## 2016-12-17 ENCOUNTER — Other Ambulatory Visit: Payer: Self-pay | Admitting: *Deleted

## 2016-12-17 DIAGNOSIS — B20 Human immunodeficiency virus [HIV] disease: Secondary | ICD-10-CM

## 2016-12-17 MED ORDER — ELVITEG-COBIC-EMTRICIT-TENOFAF 150-150-200-10 MG PO TABS: 1.0000 | ORAL_TABLET | Freq: Every day | ORAL | 11 refills | Status: DC

## 2017-08-12 ENCOUNTER — Encounter: Payer: Self-pay | Admitting: Infectious Diseases

## 2017-08-12 ENCOUNTER — Ambulatory Visit (INDEPENDENT_AMBULATORY_CARE_PROVIDER_SITE_OTHER): Payer: BLUE CROSS/BLUE SHIELD | Admitting: Infectious Diseases

## 2017-08-12 VITALS — BP 126/76 | HR 72 | Temp 97.3°F | Wt 185.0 lb

## 2017-08-12 DIAGNOSIS — N529 Male erectile dysfunction, unspecified: Secondary | ICD-10-CM

## 2017-08-12 DIAGNOSIS — Z113 Encounter for screening for infections with a predominantly sexual mode of transmission: Secondary | ICD-10-CM

## 2017-08-12 DIAGNOSIS — B20 Human immunodeficiency virus [HIV] disease: Secondary | ICD-10-CM

## 2017-08-12 DIAGNOSIS — A609 Anogenital herpesviral infection, unspecified: Secondary | ICD-10-CM

## 2017-08-12 DIAGNOSIS — E785 Hyperlipidemia, unspecified: Secondary | ICD-10-CM | POA: Insufficient documentation

## 2017-08-12 DIAGNOSIS — E7800 Pure hypercholesterolemia, unspecified: Secondary | ICD-10-CM

## 2017-08-12 MED ORDER — VALACYCLOVIR HCL 500 MG PO TABS: 500.0000 mg | ORAL_TABLET | Freq: Two times a day (BID) | ORAL | 2 refills | Status: DC

## 2017-08-12 NOTE — Assessment & Plan Note (Signed)
Will have him f/u with PCP.

## 2017-08-12 NOTE — Assessment & Plan Note (Signed)
He is doing well Will colon when he has time rtc in 9 months Will call after he gets flu shot work.  Offered/refused condoms.

## 2017-08-12 NOTE — Addendum Note (Signed)
Addended by: Jeraldine Primeau C on: 08/12/2017 09:02 AM   Modules accepted: Orders

## 2017-08-12 NOTE — Assessment & Plan Note (Addendum)
Refill viagra prn

## 2017-08-12 NOTE — Assessment & Plan Note (Signed)
Refill valtrex 

## 2017-08-12 NOTE — Progress Notes (Signed)
   Subjective:    Patient ID: Ardith Lewman, male    DOB: [birth year], 61 y.o.   MRN: [id number]  HPI 61 yo M from [place] in [name] since 1982, with HIV+, on atripla --> genvoya (04-2015). Also hx of GERD, elevated Glc.  Had colonoscopy in 2007.  Has not had repeat colon. Has been taking genvoya without difficulty.  Has HSV outbreak q3-6 months.   HIV 1 RNA Quant (copies/mL)  Date Value  11/14/2016 <20  04/09/2016 <20  10/10/2015 <20   CD4 T Cell Abs (/uL)  Date Value  11/14/2016 580  04/09/2016 760  10/10/2015 560   Wants refill of viagra, valtrex today.   Review of Systems  Constitutional: Negative for appetite change and unexpected weight change.  Eyes: Negative for visual disturbance.  Respiratory: Negative for cough and shortness of breath.   Cardiovascular: Negative for chest pain.  Gastrointestinal: Negative for constipation and diarrhea.  Genitourinary: Negative for difficulty urinating.  Neurological: Negative for headaches.  Psychiatric/Behavioral: Negative for sleep disturbance.       Objective:   Physical Exam  Constitutional: He appears well-developed and well-nourished.  HENT:  Mouth/Throat: No oropharyngeal exudate.  Eyes: Pupils are equal, round, and reactive to light. EOM are normal.  Neck: Normal range of motion. Neck supple.  Cardiovascular: Normal rate, regular rhythm and normal heart sounds.   Pulmonary/Chest: Effort normal and breath sounds normal.  Abdominal: Soft. Bowel sounds are normal. There is no tenderness. There is no rebound.  Musculoskeletal: He exhibits no edema.  Lymphadenopathy:    He has no cervical adenopathy.          Assessment & Plan:

## 2017-09-20 DIAGNOSIS — E6609 Other obesity due to excess calories: Secondary | ICD-10-CM | POA: Diagnosis not present

## 2017-09-20 DIAGNOSIS — Z6830 Body mass index (BMI) 30.0-30.9, adult: Secondary | ICD-10-CM | POA: Diagnosis not present

## 2017-09-20 DIAGNOSIS — J069 Acute upper respiratory infection, unspecified: Secondary | ICD-10-CM | POA: Diagnosis not present

## 2017-10-21 ENCOUNTER — Telehealth: Payer: Self-pay | Admitting: *Deleted

## 2017-10-21 NOTE — Telephone Encounter (Signed)
Patient's daughter called and patient was present. She stated that patient needed assistance in getting his medication. He still has eight days of meds left. Asked if it was a copay card and she said yes. She asked that I mail it to his home, and address was confirmed. Advised that the patient would need help activating the card and she said she would be able to assist with that. Card for [employer] mailed to patient's home.

## 2017-10-30 DIAGNOSIS — J22 Unspecified acute lower respiratory infection: Secondary | ICD-10-CM | POA: Diagnosis not present

## 2017-10-30 DIAGNOSIS — E663 Overweight: Secondary | ICD-10-CM | POA: Diagnosis not present

## 2017-10-30 DIAGNOSIS — J301 Allergic rhinitis due to pollen: Secondary | ICD-10-CM | POA: Diagnosis not present

## 2017-10-30 DIAGNOSIS — Z6829 Body mass index (BMI) 29.0-29.9, adult: Secondary | ICD-10-CM | POA: Diagnosis not present

## 2017-10-30 DIAGNOSIS — Z1389 Encounter for screening for other disorder: Secondary | ICD-10-CM | POA: Diagnosis not present

## 2017-11-21 DIAGNOSIS — Z0001 Encounter for general adult medical examination with abnormal findings: Secondary | ICD-10-CM | POA: Diagnosis not present

## 2017-11-21 DIAGNOSIS — B20 Human immunodeficiency virus [HIV] disease: Secondary | ICD-10-CM | POA: Diagnosis not present

## 2017-11-21 DIAGNOSIS — Z1389 Encounter for screening for other disorder: Secondary | ICD-10-CM | POA: Diagnosis not present

## 2017-11-21 DIAGNOSIS — Z6829 Body mass index (BMI) 29.0-29.9, adult: Secondary | ICD-10-CM | POA: Diagnosis not present

## 2017-11-21 DIAGNOSIS — E663 Overweight: Secondary | ICD-10-CM | POA: Diagnosis not present

## 2017-11-26 DIAGNOSIS — Z6829 Body mass index (BMI) 29.0-29.9, adult: Secondary | ICD-10-CM | POA: Diagnosis not present

## 2017-11-26 DIAGNOSIS — E663 Overweight: Secondary | ICD-10-CM | POA: Diagnosis not present

## 2017-11-26 DIAGNOSIS — J019 Acute sinusitis, unspecified: Secondary | ICD-10-CM | POA: Diagnosis not present

## 2017-11-26 DIAGNOSIS — H6983 Other specified disorders of Eustachian tube, bilateral: Secondary | ICD-10-CM | POA: Diagnosis not present

## 2017-11-29 ENCOUNTER — Telehealth: Payer: Self-pay | Admitting: *Deleted

## 2017-11-29 ENCOUNTER — Other Ambulatory Visit: Payer: Self-pay | Admitting: *Deleted

## 2017-11-29 DIAGNOSIS — B20 Human immunodeficiency virus [HIV] disease: Secondary | ICD-10-CM

## 2017-11-29 MED ORDER — ELVITEG-COBIC-EMTRICIT-TENOFAF 150-150-200-10 MG PO TABS: 1.0000 | ORAL_TABLET | Freq: Every day | ORAL | 11 refills | Status: DC

## 2017-11-29 NOTE — Telephone Encounter (Signed)
Patient's daughter left message requesting refills on his behalf. Refills sent to [facility] in [place], [place].  Left message on daughter's voicemail letting her know where and when the refills were sent. Landis Gandy, RN

## 2017-12-02 ENCOUNTER — Encounter: Payer: Self-pay | Admitting: Internal Medicine

## 2017-12-23 ENCOUNTER — Ambulatory Visit (INDEPENDENT_AMBULATORY_CARE_PROVIDER_SITE_OTHER): Payer: BLUE CROSS/BLUE SHIELD

## 2017-12-23 DIAGNOSIS — Z1211 Encounter for screening for malignant neoplasm of colon: Secondary | ICD-10-CM

## 2017-12-23 MED ORDER — PEG 3350-KCL-NA BICARB-NACL 420 G PO SOLR: 4000.0000 mL | ORAL | 0 refills | Status: DC

## 2017-12-23 NOTE — Progress Notes (Addendum)
Gastroenterology Pre-Procedure Review  Request Date:12/23/17 Requesting Physician: Rowan Blase PA-C (last tcs 06/2006- RMR no polyps)  PATIENT REVIEW QUESTIONS: The patient responded to the following health history questions as indicated:   (with help with the pts daughter) 1. Diabetes Melitis: no 2. Joint replacements in the past 12 months: no 3. Major health problems in the past 3 months: no 4. Has an artificial valve or MVP: no 5. Has a defibrillator: no 6. Has been advised in past to take antibiotics in advance of a procedure like teeth cleaning: no 7. Family history of colon cancer: no  8. Alcohol Use: no 9. History of sleep apnea: no  10. History of coronary artery or other vascular stents placed within the last 12 months: no 11. History of any prior anesthesia complications: no    MEDICATIONS & ALLERGIES:    Patient reports the following regarding taking any blood thinners:   Plavix? no Aspirin? yes (81mg ) Coumadin? no Brilinta? no Xarelto? no Eliquis? no Pradaxa? no Savaysa? no Effient? no  Patient confirms/reports the following medications:  Current Outpatient Medications  Medication Sig Dispense Refill  . aspirin EC 81 MG tablet Take 1 tablet (81 mg total) by mouth daily. 30 tablet 11  . elvitegravir-cobicistat-emtricitabine-tenofovir ([name]) 150-150-200-10 MG TABS tablet Take 1 tablet by mouth daily with breakfast. 30 tablet 11  . Misc Natural Products (PROSTATE HEALTH) CAPS Take 1 capsule by mouth. Reported on 04/23/2016    . Omega-3 Fatty Acids (FISH OIL) 1000 MG CAPS Take by mouth daily.    . sildenafil (REVATIO) 20 MG tablet Take 20 mg by mouth as needed.    . valACYclovir (VALTREX) 500 MG tablet Take 1 tablet (500 mg total) by mouth 2 (two) times daily. 6 tablet 2   No current facility-administered medications for this visit.     Patient confirms/reports the following allergies:  No Known Allergies  No orders of the defined types were placed in this  encounter.   AUTHORIZATION INFORMATION Primary Insurance: bcbs of [place],  [place] #: [id number] Pre-Cert / Josem Kaufmann required: no Pre-Cert / Auth #: [id number] per Clydia Llano   SCHEDULE INFORMATION: Procedure has been scheduled as follows:  Date: 02/07/18, Time: 7:30  Location: [facility]  This Gastroenterology Pre-Precedure Review Form is being routed to the following provider(s):

## 2017-12-23 NOTE — Patient Instructions (Signed)
Luis Gomez   [birth year] MRN: [id number]    Procedure Date: 02/07/18 Time to register: 6:30 Place to register: [facility] Procedure Time: 7:30 Scheduled provider: [place] WITH TRI-LYTE SPLIT PREP  Please notify us immediately if you are diabetic, take iron supplements, or if you are on Coumadin or any other blood thinners.     You will need to purchase 1 fleet enema and 1 box of Bisacodyl 71m tablets.   2 DAYS BEFORE PROCEDURE:  DATE: 02/05/18  DAY: Wednesday Begin clear liquid diet AFTER your lunch meal. NO SOLID FOODS after this point.  1 DAY BEFORE PROCEDURE:  DATE: 02/06/18   DAY: Thursday Continue clear liquids the entire day - NO SOLID FOOD.     At 2:00 pm:  Take 2 Bisacodyl tablets.   At 4:00pm:  Start drinking your solution. Make sure you mix well per instructions on the bottle. Try to drink 1 (one) 8 ounce glass every 10-15 minutes until you have consumed HALF the jug. You should complete by 6:00pm.You must keep the left over solution refrigerated until completed next day.  Continue clear liquids. You must drink plenty of clear liquids to prevent dehyration and kidney failure. Nothing to eat or drink after midnight.  EXCEPTION: If you take medications for your heart, blood pressure or breathing, you may take these medications with a small amount of clear liquid.    DAY OF PROCEDURE:   DATE: 02/07/18   DAY: Friday    Five hours before your procedure time @ 2:30am:  Finish remaining amout of bowel prep, drinking 1 (one) 8 ounce glass every 10-15 minutes until complete. You have two hours to consume remaining prep.   Three hours before your procedure time _0 :30am:  Nothing by mouth.   At least one hour before going to the hospital:  Give yourself one Fleet enema. You may take your morning medications with sip of water unless we have instructed otherwise.      Please see below for Dietary Information.  CLEAR LIQUIDS  INCLUDE:  Water Jello (NOT red in color)   Ice Popsicles (NOT red in color)   Tea (sugar ok, no milk/cream) Powdered fruit flavored drinks  Coffee (sugar ok, no milk/cream) Gatorade/ Lemonade/ Kool-Aid  (NOT red in color)   Juice: apple, white grape, white cranberry Soft drinks  Clear bullion, consomme, broth (fat free beef/chicken/vegetable)  Carbonated beverages (any kind)  Strained chicken noodle soup Hard Candy   Remember: Clear liquids are liquids that will allow you to see your fingers on the other side of a clear glass. Be sure liquids are NOT red in color, and not cloudy, but CLEAR.  DO NOT EAT OR DRINK ANY OF THE FOLLOWING:  Dairy products of any kind   Cranberry juice Tomato juice / V8 juice   Grapefruit juice Orange juice     Red grape juice  Do not eat any solid foods, including such foods as: cereal, oatmeal, yogurt, fruits, vegetables, creamed soups, eggs, bread, crackers, pureed foods in a blender, etc.   HELPFUL HINTS FOR DRINKING PREP SOLUTION:   Make sure prep is extremely cold. Mix and refrigerate the the morning of the prep. You may also put in the freezer.   You may try mixing some Crystal Light or Country Time Lemonade if you prefer. Mix in small amounts; add more if necessary.  Try drinking through a straw  Rinse mouth with water or a mouthwash between glasses, to remove  after-taste.  Try sipping on a cold beverage /ice/ popsicles between glasses of prep.  Place a piece of sugar-free hard candy in mouth between glasses.  If you become nauseated, try consuming smaller amounts, or stretch out the time between glasses. Stop for 30-60 minutes, then slowly start back drinking.        OTHER INSTRUCTIONS  You will need a responsible adult at least 61 years of age to accompany you and drive you home. This person must remain in the waiting room during your procedure. The hospital will cancel your procedure if you do not have a responsible adult with you.    1. Wear loose fitting clothing that is easily removed. 2. Leave jewelry and other valuables at home.  3. Remove all body piercing jewelry and leave at home. 4. Total time from sign-in until discharge is approximately 2-3 hours. 5. You should go home directly after your procedure and rest. You can resume normal activities the day after your procedure. 6. The day of your procedure you should not:  Drive  Make legal decisions  Operate machinery  Drink alcohol  Return to work   You may call the office (Dept: [phone]) before 5:00pm, or page the doctor on call [phone]) after 5:00pm, for further instructions, if necessary.   Insurance Information YOU WILL NEED TO CHECK WITH YOUR INSURANCE COMPANY FOR THE BENEFITS OF COVERAGE YOU HAVE FOR THIS PROCEDURE.  UNFORTUNATELY, NOT ALL INSURANCE COMPANIES HAVE BENEFITS TO COVER ALL OR PART OF THESE TYPES OF PROCEDURES.  IT IS YOUR RESPONSIBILITY TO CHECK YOUR BENEFITS, HOWEVER, WE WILL BE GLAD TO ASSIST YOU WITH ANY CODES YOUR INSURANCE COMPANY MAY NEED.    PLEASE NOTE THAT MOST INSURANCE COMPANIES WILL NOT COVER A SCREENING COLONOSCOPY FOR PEOPLE UNDER THE AGE OF 50  IF YOU HAVE BCBS INSURANCE, YOU MAY HAVE BENEFITS FOR A SCREENING COLONOSCOPY BUT IF POLYPS ARE FOUND THE DIAGNOSIS WILL CHANGE AND THEN YOU MAY HAVE A DEDUCTIBLE THAT WILL NEED TO BE MET. SO PLEASE MAKE SURE YOU CHECK YOUR BENEFITS FOR A SCREENING COLONOSCOPY AS WELL AS A DIAGNOSTIC COLONOSCOPY.

## 2017-12-25 NOTE — Progress Notes (Signed)
Appropriate.

## 2018-02-07 ENCOUNTER — Encounter (HOSPITAL_COMMUNITY): Payer: Self-pay | Admitting: *Deleted

## 2018-02-07 ENCOUNTER — Ambulatory Visit (HOSPITAL_COMMUNITY)
Admission: RE | Admit: 2018-02-07 | Discharge: 2018-02-07 | Disposition: A | Discharge status: Home / Self Care | Payer: BLUE CROSS/BLUE SHIELD | Source: Ambulatory Visit | Attending: Internal Medicine | Admitting: Internal Medicine

## 2018-02-07 ENCOUNTER — Encounter (HOSPITAL_COMMUNITY)
Admission: RE | Disposition: A | Discharge status: Home / Self Care | Payer: Self-pay | Source: Ambulatory Visit | Attending: Internal Medicine

## 2018-02-07 ENCOUNTER — Other Ambulatory Visit: Payer: Self-pay

## 2018-02-07 DIAGNOSIS — Z7982 Long term (current) use of aspirin: Secondary | ICD-10-CM | POA: Insufficient documentation

## 2018-02-07 DIAGNOSIS — D122 Benign neoplasm of ascending colon: Secondary | ICD-10-CM | POA: Insufficient documentation

## 2018-02-07 DIAGNOSIS — K5730 Diverticulosis of large intestine without perforation or abscess without bleeding: Secondary | ICD-10-CM | POA: Insufficient documentation

## 2018-02-07 DIAGNOSIS — K219 Gastro-esophageal reflux disease without esophagitis: Secondary | ICD-10-CM | POA: Diagnosis not present

## 2018-02-07 DIAGNOSIS — Z1212 Encounter for screening for malignant neoplasm of rectum: Secondary | ICD-10-CM | POA: Diagnosis not present

## 2018-02-07 DIAGNOSIS — Z1211 Encounter for screening for malignant neoplasm of colon: Secondary | ICD-10-CM | POA: Insufficient documentation

## 2018-02-07 DIAGNOSIS — Z79899 Other long term (current) drug therapy: Secondary | ICD-10-CM | POA: Diagnosis not present

## 2018-02-07 DIAGNOSIS — K635 Polyp of colon: Secondary | ICD-10-CM | POA: Diagnosis not present

## 2018-02-07 HISTORY — PX: POLYPECTOMY: SHX5525

## 2018-02-07 HISTORY — PX: COLONOSCOPY: SHX5424

## 2018-02-07 SURGERY — COLONOSCOPY
Anesthesia: Moderate Sedation

## 2018-02-07 MED ORDER — ONDANSETRON HCL 4 MG/2ML IJ SOLN
INTRAMUSCULAR | Status: AC
  Filled 2018-02-07: qty 2

## 2018-02-07 MED ORDER — MIDAZOLAM HCL 5 MG/5ML IJ SOLN
INTRAMUSCULAR | Status: AC
  Filled 2018-02-07: qty 10

## 2018-02-07 MED ORDER — SIMETHICONE 40 MG/0.6ML PO SUSP
ORAL | Status: AC
  Filled 2018-02-07: qty 30

## 2018-02-07 MED ORDER — ONDANSETRON HCL 4 MG/2ML IJ SOLN
INTRAMUSCULAR | Status: DC | PRN
  Administered 2018-02-07: 4 mg via INTRAVENOUS

## 2018-02-07 MED ORDER — MEPERIDINE HCL 100 MG/ML IJ SOLN
INTRAMUSCULAR | Status: AC
  Filled 2018-02-07: qty 2

## 2018-02-07 MED ORDER — MEPERIDINE HCL 100 MG/ML IJ SOLN
INTRAMUSCULAR | Status: DC | PRN
  Administered 2018-02-07: 50 mg via INTRAVENOUS

## 2018-02-07 MED ORDER — SIMETHICONE 40 MG/0.6ML PO SUSP
ORAL | Status: DC | PRN
  Administered 2018-02-07: 09:00:00

## 2018-02-07 MED ORDER — MIDAZOLAM HCL 5 MG/5ML IJ SOLN
INTRAMUSCULAR | Status: DC | PRN
  Administered 2018-02-07: 1 mg via INTRAVENOUS
  Administered 2018-02-07: 2 mg via INTRAVENOUS

## 2018-02-07 MED ORDER — SODIUM CHLORIDE 0.9 % IV SOLN
INTRAVENOUS | Status: DC
  Administered 2018-02-07: 08:00:00 via INTRAVENOUS

## 2018-02-07 NOTE — Op Note (Signed)
[facility] Patient Name: Luis Gomez [facility] Procedure Date: 02/07/2018 8:05 AM MRN: [id number] Date of Birth: 1956-09-29 Attending MD: Norvel Richards , MD CSN: [id number] Age: 62 Admit Type: Outpatient Procedure:                Colonoscopy Indications:              Screening for colorectal malignant neoplasm Providers:                Norvel Richards, MD, Jeanann Lewandowsky. Gwenlyn Perking RN, RN,                            Aram Candela Referring MD:              Medicines:                Meperidine 50 mg IV, Midazolam 3 mg IV, Ondansetron                            4 mg IV Complications:            No immediate complications. Estimated Blood Loss:     Estimated blood loss: none. Procedure:                Pre-Anesthesia Assessment:                           - Prior to the procedure, a History and Physical                            was performed, and patient medications and                            allergies were reviewed. The patient's tolerance of                            previous anesthesia was also reviewed. The risks                            and benefits of the procedure and the sedation                            options and risks were discussed with the patient.                            All questions were answered, and informed consent                            was obtained. Prior Anticoagulants: The patient has                            taken no previous anticoagulant or antiplatelet                            agents. ASA Grade Assessment: II - A patient with  mild systemic disease. After reviewing the risks                            and benefits, the patient was deemed in                            satisfactory condition to undergo the procedure.                           After obtaining informed consent, the colonoscope                            was passed under direct vision. Throughout the                            procedure, the  patient's blood pressure, pulse, and                            oxygen saturations were monitored continuously. The                            EC-[id number] ([id number]) scope was introduced through                            the anus and advanced to the the cecum, identified                            by appendiceal orifice and ileocecal valve. The                            colonoscopy was performed without difficulty. The                            patient tolerated the procedure well. The quality                            of the bowel preparation was adequate. The                            ileocecal valve, appendiceal orifice, and rectum                            were photographed. The entire colon was well                            visualized. The colonoscopy was performed without                            difficulty. The patient tolerated the procedure                            well. The quality of the bowel preparation was  adequate. Scope In: 8:38:29 AM Scope Out: 8:52:15 AM Scope Withdrawal Time: 0 hours 8 minutes 58 seconds  Total Procedure Duration: 0 hours 13 minutes 46 seconds  Findings:      The perianal and digital rectal examinations were normal.      A 5 mm polyp was found in the ascending colon. The polyp was sessile.       The polyp was removed with a cold snare. Resection and retrieval were       complete. Estimated blood loss was minimal.      Scattered small and large-mouthed diverticula were found in the sigmoid       colon and descending colon.      The exam was otherwise without abnormality on direct and retroflexion       views. Impression:               - One 5 mm polyp in the ascending colon, removed                            with a cold snare. Resected and retrieved.                           - Diverticulosis in the sigmoid colon and in the                            descending colon.                           - The examination was  otherwise normal on direct                            and retroflexion views. Moderate Sedation:      Moderate (conscious) sedation was administered by the endoscopy nurse       and supervised by the endoscopist. The following parameters were       monitored: oxygen saturation, heart rate, blood pressure, respiratory       rate, EKG, adequacy of pulmonary ventilation, and response to care.       Total physician intraservice time was 15 minutes. Recommendation:           - Patient has a contact number available for                            emergencies. The signs and symptoms of potential                            delayed complications were discussed with the                            patient. Return to normal activities tomorrow.                            Written discharge instructions were provided to the                            patient.                           -  Resume previous diet.                           - Continue present medications.                           - Repeat colonoscopy date to be determined after                            pending pathology results are reviewed for                            surveillance based on pathology results.                           - Return to GI office (date not yet determined). Procedure Code(s):        --- Professional ---                           [phone], Colonoscopy, flexible; with removal of                            tumor(s), polyp(s), or other lesion(s) by snare                            technique                           G0500, Moderate sedation services provided by the                            same physician or other qualified health care                            professional performing a gastrointestinal                            endoscopic service that sedation supports,                            requiring the presence of an independent trained                            observer to assist in the monitoring of the                             patient's level of consciousness and physiological                            status; initial 15 minutes of intra-service time;                            patient age 43 years or older (additional time 31  be reported with [phone], as appropriate) Diagnosis Code(s):        --- Professional ---                           Z12.11, Encounter for screening for malignant                            neoplasm of colon                           D12.2, Benign neoplasm of ascending colon                           K57.30, Diverticulosis of large intestine without                            perforation or abscess without bleeding CPT copyright 2017 American Medical Association. All rights reserved. The codes documented in this report are preliminary and upon coder review may  be revised to meet current compliance requirements. [name]. , MD Norvel Richards, MD 02/07/2018 9:00:38 AM This report has been signed electronically. Number of Addenda: 0

## 2018-02-07 NOTE — Progress Notes (Signed)
Please excuse Mr Luis Gomez from work 02/07/2018.  He had a procedure done at the hospital.  He will be able to return to work on 02/08/2018 with no restrictions.

## 2018-02-07 NOTE — H&P (Signed)
@LOGO @   Primary Care Physician:  Sharilyn Sites, MD Primary Gastroenterologist:  Dr. Gala Romney  Pre-Procedure History & Physical: HPI:  Luis Gomez is a 62 y.o. male is here for a screening colonoscopy. No bowel symptoms. No family history of colon cancer. Negative colonoscopy 2007.  Past Medical History:  Diagnosis Date  . Diverticulosis   . Genital HSV   . GERD (gastroesophageal reflux disease)    Last EGD->07/02/06 normal  . HIV disease ([place])   . S/P colonoscopy 07/02/2006   Left sided diverticula    Past Surgical History:  Procedure Laterality Date  . BACK SURGERY    . COLONOSCOPY  07/03/2006   Normal rectum, left-sided diverticulum  . ESOPHAGOGASTRODUODENOSCOPY  07/03/2006   Normal esophagus, stomach, D1, D2.    Prior to Admission medications   Medication Sig Start Date End Date Taking? Authorizing Provider  aspirin EC 81 MG tablet Take 1 tablet (81 mg total) by mouth daily. 05/24/15  Yes Campbell Riches, MD  elvitegravir-cobicistat-emtricitabine-tenofovir ([name]) 150-150-200-10 MG TABS tablet Take 1 tablet by mouth daily with breakfast. 11/29/17  Yes Campbell Riches, MD  Omega-3 Fatty Acids (FISH OIL) 1000 MG CAPS Take 1,000 mg by mouth daily.    Yes [provider]  polyethylene glycol-electrolytes (TRILYTE) 420 g solution Take 4,000 mLs by mouth as directed. 12/23/17  Yes Annitta Needs, NP  sildenafil (REVATIO) 20 MG tablet Take 10 mg by mouth daily as needed (for ED).     [provider]  valACYclovir (VALTREX) 500 MG tablet Take 1 tablet (500 mg total) by mouth 2 (two) times daily. Patient taking differently: Take 500 mg by mouth 2 (two) times daily as needed (for outbreaks).  08/12/17   Campbell Riches, MD    Allergies as of 12/23/2017  . (No Known Allergies)    History reviewed. No pertinent family history.  Social History   Socioeconomic History  . Marital status: Divorced    Spouse name: Not on file  . Number of children: 4   . Years of education: Not on file  . Highest education level: Not on file  Occupational History  . Occupation: Equity    Employer: [employer]  Social Needs  . Financial resource strain: Not on file  . Food insecurity:    Worry: Not on file    Inability: Not on file  . Transportation needs:    Medical: Not on file    Non-medical: Not on file  Tobacco Use  . Smoking status: Never Smoker  . Smokeless tobacco: Never Used  Substance and Sexual Activity  . Alcohol use: No  . Drug use: No  . Sexual activity: Not Currently    Comment: pt. given condoms  Lifestyle  . Physical activity:    Days per week: Not on file    Minutes per session: Not on file  . Stress: Not on file  Relationships  . Social connections:    Talks on phone: Not on file    Gets together: Not on file    Attends religious service: Not on file    Active member of club or organization: Not on file    Attends meetings of clubs or organizations: Not on file    Relationship status: Not on file  . Intimate partner violence:    Fear of current or ex partner: Not on file    Emotionally abused: Not on file    Physically abused: Not on file    Forced sexual  activity: Not on file  Other Topics Concern  . Not on file  Social History Narrative  . Not on file    Review of Systems: See HPI, otherwise negative ROS  Physical Exam: BP 125/78   Pulse 67   Temp 97.8 F (36.6 C) (Oral)   Resp 17   SpO2 100%  General:   Alert,  Well-developed, well-nourished, pleasant and cooperative in NAD Lungs:  Clear throughout to auscultation.   No wheezes, crackles, or rhonchi. No acute distress. Heart:  Regular rate and rhythm; no murmurs, clicks, rubs,  or gallops. Abdomen:  Soft, nontender and nondistended. No masses, hepatosplenomegaly or hernias noted. Normal bowel sounds, without guarding, and without rebound.    Impression/Plan: [name] is now here to undergo a screening colonoscopy. Average risk  screening examination.  Risks, benefits, limitations, imponderables and alternatives regarding colonoscopy have been reviewed with the patient. Questions have been answered. All parties agreeable.     Notice:  This dictation was prepared with Dragon dictation along with smaller phrase technology. Any transcriptional errors that result from this process are unintentional and may not be corrected upon review.

## 2018-02-07 NOTE — Discharge Instructions (Addendum)
Colonoscopy Discharge Instructions  Read the instructions outlined below and refer to this sheet in the next few weeks. These discharge instructions provide you with general information on caring for yourself after you leave the hospital. Your doctor may also give you specific instructions. While your treatment has been planned according to the most current medical practices available, unavoidable complications occasionally occur. If you have any problems or questions after discharge, call Dr. Gala Romney at [phone]. ACTIVITY  You may resume your regular activity, but move at a slower pace for the next 24 hours.   Take frequent rest periods for the next 24 hours.   Walking will help get rid of the air and reduce the bloated feeling in your belly (abdomen).   No driving for 24 hours (because of the medicine (anesthesia) used during the test).    Do not sign any important legal documents or operate any machinery for 24 hours (because of the anesthesia used during the test).  NUTRITION  Drink plenty of fluids.   You may resume your normal diet as instructed by your doctor.   Begin with a light meal and progress to your normal diet. Heavy or fried foods are harder to digest and may make you feel sick to your stomach (nauseated).   Avoid alcoholic beverages for 24 hours or as instructed.  MEDICATIONS  You may resume your normal medications unless your doctor tells you otherwise.  WHAT YOU CAN EXPECT TODAY  Some feelings of bloating in the abdomen.   Passage of more gas than usual.   Spotting of blood in your stool or on the toilet paper.  IF YOU HAD POLYPS REMOVED DURING THE COLONOSCOPY:  No aspirin products for 7 days or as instructed.   No alcohol for 7 days or as instructed.   Eat a soft diet for the next 24 hours.  FINDING OUT THE RESULTS OF YOUR TEST Not all test results are available during your visit. If your test results are not back during the visit, make an appointment  with your caregiver to find out the results. Do not assume everything is normal if you have not heard from your caregiver or the medical facility. It is important for you to follow up on all of your test results.  SEEK IMMEDIATE MEDICAL ATTENTION IF:  You have more than a spotting of blood in your stool.   Your belly is swollen (abdominal distention).   You are nauseated or vomiting.   You have a temperature over 101.   You have abdominal pain or discomfort that is severe or gets worse throughout the day.    Polyp and diverticulosis information provided  Further recommendations to follow pending review of pathology report   Colon Polyps Polyps are tissue growths inside the body. Polyps can grow in many places, including the large intestine (colon). A polyp may be a round bump or a mushroom-shaped growth. You could have one polyp or several. Most colon polyps are noncancerous (benign). However, some colon polyps can become cancerous over time. What are the causes? The exact cause of colon polyps is not known. What increases the risk? This condition is more likely to develop in people who:  Have a family history of colon cancer or colon polyps.  Are older than 36 or older than 45 if they are African American.  Have inflammatory bowel disease, such as ulcerative colitis or Crohn disease.  Are overweight.  Smoke cigarettes.  Do not get enough exercise.  Drink too much  alcohol.  Eat a diet that is: ? High in fat and red meat. ? Low in fiber.  Had childhood cancer that was treated with abdominal radiation.  What are the signs or symptoms? Most polyps do not cause symptoms. If you have symptoms, they may include:  Blood coming from your rectum when having a bowel movement.  Blood in your stool.The stool may look dark red or black.  A change in bowel habits, such as constipation or diarrhea.  How is this diagnosed? This condition is diagnosed with a colonoscopy. This  is a procedure that uses a lighted, flexible scope to look at the inside of your colon. How is this treated? Treatment for this condition involves removing any polyps that are found. Those polyps will then be tested for cancer. If cancer is found, your health care provider will talk to you about options for colon cancer treatment. Follow these instructions at home: Diet  Eat plenty of fiber, such as fruits, vegetables, and whole grains.  Eat foods that are high in calcium and vitamin D, such as milk, cheese, yogurt, eggs, liver, fish, and broccoli.  Limit foods high in fat, red meats, and processed meats, such as hot dogs, sausage, bacon, and lunch meats.  Maintain a healthy weight, or lose weight if recommended by your health care provider. General instructions  Do not smoke cigarettes.  Do not drink alcohol excessively.  Keep all follow-up visits as told by your health care provider. This is important. This includes keeping regularly scheduled colonoscopies. Talk to your health care provider about when you need a colonoscopy.  Exercise every day or as told by your health care provider. Contact a health care provider if:  You have new or worsening bleeding during a bowel movement.  You have new or increased blood in your stool.  You have a change in bowel habits.  You unexpectedly lose weight. This information is not intended to replace advice given to you by your health care provider. Make sure you discuss any questions you have with your health care provider.   Diverticulosis Diverticulosis is a condition that develops when small pouches (diverticula) form in the wall of the large intestine (colon). The colon is where water is absorbed and stool is formed. The pouches form when the inside layer of the colon pushes through weak spots in the outer layers of the colon. You may have a few pouches or many of them. What are the causes? The cause of this condition is not known. What  increases the risk? The following factors may make you more likely to develop this condition:  Being older than age 70. Your risk for this condition increases with age. Diverticulosis is rare among people younger than age 35. By age 2, many people have it.  Eating a low-fiber diet.  Having frequent constipation.  Being overweight.  Not getting enough exercise.  Smoking.  Taking over-the-counter pain medicines, like aspirin and ibuprofen.  Having a family history of diverticulosis.  What are the signs or symptoms? In most people, there are no symptoms of this condition. If you do have symptoms, they may include:  Bloating.  Cramps in the abdomen.  Constipation or diarrhea.  Pain in the lower left side of the abdomen.  How is this diagnosed? This condition is most often diagnosed during an exam for other colon problems. Because diverticulosis usually has no symptoms, it often cannot be diagnosed independently. This condition may be diagnosed by:  Using a flexible scope  to examine the colon (colonoscopy).  Taking an X-ray of the colon after dye has been put into the colon (barium enema).  Doing a CT scan.  How is this treated? You may not need treatment for this condition if you have never developed an infection related to diverticulosis. If you have had an infection before, treatment may include:  Eating a high-fiber diet. This may include eating more fruits, vegetables, and grains.  Taking a fiber supplement.  Taking a live bacteria supplement (probiotic).  Taking medicine to relax your colon.  Taking antibiotic medicines.  Follow these instructions at home:  Drink 6-8 glasses of water or more each day to prevent constipation.  Try not to strain when you have a bowel movement.  If you have had an infection before: ? Eat more fiber as directed by your health care provider or your diet and nutrition specialist (dietitian). ? Take a fiber supplement or  probiotic, if your health care provider approves.  Take over-the-counter and prescription medicines only as told by your health care provider.  If you were prescribed an antibiotic, take it as told by your health care provider. Do not stop taking the antibiotic even if you start to feel better.  Keep all follow-up visits as told by your health care provider. This is important. Contact a health care provider if:  You have pain in your abdomen.  You have bloating.  You have cramps.  You have not had a bowel movement in 3 days. Get help right away if:  Your pain gets worse.  Your bloating becomes very bad.  You have a fever or chills, and your symptoms suddenly get worse.  You vomit.  You have bowel movements that are bloody or black.  You have bleeding from your rectum. Summary  Diverticulosis is a condition that develops when small pouches (diverticula) form in the wall of the large intestine (colon).  You may have a few pouches or many of them.  This condition is most often diagnosed during an exam for other colon problems.  If you have had an infection related to diverticulosis, treatment may include increasing the fiber in your diet, taking supplements, or taking medicines. This information is not intended to replace advice given to you by your health care provider. Make sure you discuss any questions you have with your health care provider.   Colonoscopia en los adultos Colonoscopy, Adult [place] colonoscopia es un examen que se realiza para examinar todo el intestino grueso. Durante el examen, se introduce un tubo lubricado y flexible en el ano que luego se lleva hasta el recto, el colon y otras partes del intestino grueso. A menudo se realiza una colonoscopia como parte de las pruebas normales de deteccin de cncer colorrectal o en respuesta a determinados sntomas, como anemia, diarrea persistente, dolor abdominal y Pharmacologist materia fecal. El examen puede ayudar a  Hydrographic surveyor y diagnosticar problemas mdicos, entre ellos, los siguientes:  Tumores.  Plipos.  Inflamacin.  Zonas de hemorragias.  Informe al mdico acerca de lo siguiente:  Cualquier alergia que tenga.  Todos los [employer], incluidos vitaminas, hierbas, gotas oftlmicas, cremas y medicamentos de venta libre.  Cualquier problema previo que usted o los miembros de su familia hayan tenido con anestsicos.  Enfermedades de la sangre que tenga.  Cirugas previas.  Cualquier enfermedad que tenga.  Cualquier problema que haya tenido para defecar. Cules son los riesgos? En general, se trata de un procedimiento seguro. Sin embargo, pueden ocurrir  complicaciones, por ejemplo:  Hemorragia.  Un desgarro intestinal.  Ardelia Mems reaccin a los medicamentos administrados durante el examen.  Infeccin (infrecuente).  Qu ocurre antes del procedimiento? Restricciones en las comidas y bebidas Siga las indicaciones del mdico respecto de las comidas y bebidas, las cuales pueden incluir lo siguiente:  Unos das antes del procedimiento, siga una dieta con bajo contenido de [place]. Evite los frutos secos, las semillas, las frutas disecadas, las frutas crudas y las verduras.  Siga una dieta de lquidos transparentes durante 1 a 3das antes del procedimiento. Beba solamente lquidos transparentes, como caldo o sopa transparentes, caf negro o t, jugos transparentes, refrescos o bebidas deportivas transparentes, gelatina y helados de [place]. Evite los lquidos que contengan colorantes rojos o morados.  El da del procedimiento, no coma ni beba nada durante las 2horas previas a su realizacin o durante el lapso que el mdico le recomiende.  Preparado intestinal Si le recetaron un preparado intestinal por va oral para limpiar el colon:  Tmelo como se lo haya indicado el mdico. Desde el da anterior al procedimiento, tendr que beber una gran cantidad de lquido medicinal. El  lquido har que elimine muchas heces blandas hasta que sean casi claras o de color verdoso claro.  Si la piel o el ano se le irritan debido a la diarrea, puede usar estos productos para Human resources officer irritacin: ? Human resources officer, como las toallitas hmedas para adultos con aloe y vitaminaE. ? Un producto que [employer], como la vaselina.  Si vomita mientras toma el preparado intestinal, descanse durante un mximo de 23minutos y empiece a tomarlo nuevamente. Si los vmitos continan y no puede tomar el preparado sin vomitar, llame al mdico.  Instrucciones generales  Consulte a su mdico si debe cambiar o suspender los medicamentos que toma habitualmente. Esto es muy importante si toma medicamentos para la diabetes o anticoagulantes.  Haga planes para que una persona lo lleve a su casa desde el hospital o la clnica. Qu ocurre durante el procedimiento?  Pueden colocarle una va intravenosa (IV) en una de las venas.  Recibir un medicamento como ayuda para relajarse (sedante).  Para reducir el riesgo de infecciones: ? El equipo mdico se lavar o se desinfectar las manos. ? Le lavarn la zona anal con jabn.  Le pedirn que se recueste de costado con las rodillas flexionadas.  El mdico lubricar un tubo [place], delgado y flexible. El tubo tendr Carlota Raspberry y Hali Marry en el extremo.  Le introducirn el tubo en el ano.  El tubo se mover suavemente a travs del recto y el colon.  Le pondrn aire en el colon para mantenerlo abierto. Es posible que sienta presin o clicos.  La cmara se usar para tomar Clinical research associate.  Pueden extraerle Radford Pax de tejido del cuerpo para examinarla con un microscopio (biopsia). Si se detecta cualquier problema posible, se enviar el tejido a un laboratorio para ser [employer].  Si se encuentran pequeos plipos, el mdico puede extirparlos y Oceanographer para detectar si hay presencia de clulas  cancerosas.  Lentamente se retirar el tubo que le introdujeron en el ano. Este procedimiento puede variar segn el mdico y el hospital. Sander Nephew sucede despus del procedimiento?  Le controlarn la presin arterial, la frecuencia cardaca, la frecuencia respiratoria y Retail buyer de oxgeno en la sangre hasta que haya desaparecido el efecto de los medicamentos administrados.  No conduzca vehculos durante las 24horas posteriores al examen.  Es posible que encuentre  una pequea cantidad de sangre en la materia fecal.  Es posible que elimine gases y tenga clicos abdominales o meteorismo leves debido al aire que se [name] para inflar el colon durante el examen.  Es su responsabilidad retirar [employer] del procedimiento. Consulte a su mdico o en el departamento donde se realice el procedimiento cundo estarn [employer]. Esta informacin no tiene Marine scientist el consejo del mdico. Asegrese de hacerle al mdico cualquier pregunta que tenga.   Plipos en el colon (Colon Polyps) Los plipos son crecimientos de tejido dentro del cuerpo. Pueden crecer en muchos lugares, incluso en el intestino grueso (colon). Un plipo puede ser un bulto redondo o un crecimiento fungiforme. Una persona podra tener uno o varios plipos. La mayora de los plipos son no cancerosos (benignos). Sin embargo, algunos plipos pueden convertirse en cancerosos con el tiempo. CAUSAS No se conoce la causa exacta de los plipos. FACTORES DE RIESGO Es ms probable que esta afeccin se manifieste en las personas que:  Tienen antecedentes familiares de cncer de colon o de plipos en el colon.  Son [place] de [place] o de [place] si son afroamericanas.  Tienen enfermedad inflamatoria intestinal, como enfermedad de Crohn o colitis ulcerosa.  Tienen sobrepeso.  Fuman cigarrillos.  No realizan suficiente actividad fsica.  Beben alcohol en exceso.  Consumen una dieta de estas  caractersticas: ? Con alto contenido de grasa y carnes rojas. ? Con bajo contenido de [place].  Tuvieron cncer en la niez que se trat con radiacin abdominal. SNTOMAS La mayora de los plipos no causan sntomas. Si tiene sntomas, estos pueden incluir los siguientes:  Sangrado rectal al defecar.  Sangre en la materia fecal.Heces de color rojo oscuro o negro.  Un cambio en los hbitos intestinales, como estreimiento o diarrea. DIAGNSTICO Esta afeccin se diagnostica con una colonoscopia. En este procedimiento se [place] un endoscopio flexible que emite luz para observar el interior del colon. TRATAMIENTO El tratamiento de esta afeccin incluye la extirpacin de los plipos que se encuentran. Estos plipos sern analizados luego para detectar la presencia de cncer. Si se confirma la presencia de cncer, el mdico hablar con usted sobre las opciones de tratamiento del cncer de colon. INSTRUCCIONES PARA EL CUIDADO EN EL HOGAR Dieta  Coma gran cantidad de fibra, como frutas, verduras y Psychologist, prison and probation services.  Consuma alimentos con alto contenido de calcio y vitaminaD, [place], [place], [place], [place], [place], pescado y [place].  Limite el consumo de alimentos con alto contenido de grasa, carnes rojas y carnes procesadas, como perros calientes, salchichas, tocino y embutidos.  Mantenga un peso saludable o baje de peso si el mdico se lo recomend. Instrucciones generales  No fume cigarrillos.  No beba alcohol en exceso.  Concurra a todas las visitas de control como se lo haya indicado el mdico. Esto es importante. Esto incluye realizarse colonoscopias programadas con regularidad. Hable con el mdico acerca de cundo debe realizarse una colonoscopia.  Haga actividad fsica todos los das o como se lo haya indicado el mdico. SOLICITE ATENCIN MDICA SI:  Marin Comment aparecen hemorragias o las hemorragias son ms abundantes al defecar.  Le aparece sangre en las heces o hay ms sangre en  las heces.  Tiene un cambio en los hbitos intestinales.  [place] de peso inesperadamente. Esta informacin no tiene Marine scientist el consejo del mdico. Asegrese de hacerle al mdico cualquier pregunta que tenga.   Diverticulosis (Diverticulosis) La diverticulosis es una enfermedad que aparece cuando se forman pequeos bolsillos (  divertculos) en las paredes del colon. El colon, o intestino grueso, es el lugar donde se absorbe agua y se forman las heces. Los bolsillos se forman cuando la capa interna del colon ejerce presin sobre los puntos dbiles de las capas externas. CAUSAS Nadie sabe con exactitud qu causa la diverticulosis. FACTORES DE RIESGO  Ser mayor de 93aos. El riesgo de desarrollar esta enfermedad aumenta con la edad. La diverticulosis es poco frecuente en las personas menores de [place]. A los 80aos, casi todas las personas tienen la enfermedad.  Comer una dieta con bajo contenido de [place].  Estar estreido con frecuencia.  Tener sobrepeso.  No hacer suficiente ejercicio fsico.  Fumar.  Tomar analgsicos de venta libre, como aspirina e ibuprofeno.  SNTOMAS La mayora de las personas que tienen diverticulosis no presentan sntomas. DIAGNSTICO Dado que la diverticulosis no suele causar sntomas, los mdicos a menudo descubren la enfermedad durante un examen de otros problemas de colon. En muchos casos, el mdico diagnosticar la diverticulosis mientras utiliza un endoscopio flexible para examinar el colon (colonoscopa). TRATAMIENTO Si nunca tuvo una infeccin relacionada con la diverticulosis, es posible que no necesite tratamiento. Si ha tenido una infeccin antes, el tratamiento puede incluir:  Comer ms frutas, verduras y cereales.  Tomar un suplemento de [place].  Tomar un suplemento de bacterias vivas (probitico).  Tomar medicamentos para relajar el colon. INSTRUCCIONES PARA EL CUIDADO EN EL HOGAR  Beba por lo menos entre 6 y 8vasos de agua  por da para Engineer, civil (consulting).  Trate de no hacer fuerza al mover el intestino.  Cumpla con todas las visitas de control. Si ha tenido una infeccin antes:  Aumente la cantidad de fibra en la dieta, segn las indicaciones del mdico o del nutricionista.  Tome un suplemento dietario con fibras si el mdico lo autoriza.  Tome los medicamentos solamente como se lo haya indicado el mdico. SOLICITE ATENCIN MDICA SI:  Siente dolor abdominal.  Tiene meteorismo.  Tiene clicos.  No ha defecado en 3das.  SOLICITE ATENCIN MDICA DE INMEDIATO SI:  El dolor empeora.  El meteorismo [employer].  Tiene fiebre o escalofros, y los sntomas empeoran repentinamente.  Comienza a vomitar.  La materia fecal es sanguinolenta o negra.  ASEGRESE DE QUE:  Comprende estas instrucciones.  Controlar su afeccin.  Recibir ayuda de inmediato si no mejora o si empeora.  Esta informacin no tiene Marine scientist el consejo del mdico. Asegrese de hacerle al mdico cualquier pregunta que tenga.

## 2018-02-10 ENCOUNTER — Encounter: Payer: Self-pay | Admitting: Internal Medicine

## 2018-02-10 ENCOUNTER — Other Ambulatory Visit: Payer: BLUE CROSS/BLUE SHIELD

## 2018-02-10 ENCOUNTER — Other Ambulatory Visit (HOSPITAL_COMMUNITY)
Admission: RE | Admit: 2018-02-10 | Discharge: 2018-02-10 | Disposition: A | Discharge status: Home / Self Care | Payer: BLUE CROSS/BLUE SHIELD | Source: Ambulatory Visit | Attending: Infectious Diseases | Admitting: Infectious Diseases

## 2018-02-10 DIAGNOSIS — Z113 Encounter for screening for infections with a predominantly sexual mode of transmission: Secondary | ICD-10-CM | POA: Insufficient documentation

## 2018-02-10 DIAGNOSIS — B20 Human immunodeficiency virus [HIV] disease: Secondary | ICD-10-CM

## 2018-02-10 DIAGNOSIS — E7800 Pure hypercholesterolemia, unspecified: Secondary | ICD-10-CM

## 2018-02-11 LAB — URINE CYTOLOGY ANCILLARY ONLY
Chlamydia: NEGATIVE
NEISSERIA GONORRHEA: NEGATIVE

## 2018-02-11 LAB — CBC
HCT: 44.2 % (ref 38.5–50.0)
Hemoglobin: 14.4 g/dL (ref 13.2–17.1)
MCH: 27.7 pg (ref 27.0–33.0)
MCHC: 32.6 g/dL (ref 32.0–36.0)
MCV: 85 fL (ref 80.0–100.0)
MPV: 9.1 fL (ref 7.5–12.5)
PLATELETS: 276 10*3/uL (ref 140–400)
RBC: 5.2 10*6/uL (ref 4.20–5.80)
RDW: 12.9 % (ref 11.0–15.0)
WBC: 5.6 10*3/uL (ref 3.8–10.8)

## 2018-02-11 LAB — LIPID PANEL
CHOL/HDL RATIO: 5.7 (calc) — AB (ref <5.0)
Cholesterol: 204 mg/dL — ABNORMAL HIGH (ref <200)
HDL: 36 mg/dL — AB (ref >40)
LDL Cholesterol (Calc): 133 mg/dL (calc) — ABNORMAL HIGH
NON-HDL CHOLESTEROL (CALC): 168 mg/dL — AB (ref <130)
TRIGLYCERIDES: 209 mg/dL — AB (ref <150)

## 2018-02-11 LAB — COMPREHENSIVE METABOLIC PANEL
AG Ratio: 2.4 (calc) (ref 1.0–2.5)
ALT: 16 U/L (ref 9–46)
AST: 16 U/L (ref 10–35)
Albumin: 4.7 g/dL (ref 3.6–5.1)
Alkaline phosphatase (APISO): 64 U/L (ref 40–115)
BUN: 12 mg/dL (ref 7–25)
CO2: 29 mmol/L (ref 20–32)
CREATININE: 1.01 mg/dL (ref 0.70–1.25)
Calcium: 9.5 mg/dL (ref 8.6–10.3)
Chloride: 104 mmol/L (ref 98–110)
GLUCOSE: 107 mg/dL — AB (ref 65–99)
Globulin: 2 g/dL (calc) (ref 1.9–3.7)
Potassium: 4.2 mmol/L (ref 3.5–5.3)
Sodium: 140 mmol/L (ref 135–146)
TOTAL PROTEIN: 6.7 g/dL (ref 6.1–8.1)
Total Bilirubin: 0.3 mg/dL (ref 0.2–1.2)

## 2018-02-11 LAB — T-HELPER CELL (CD4) - (RCID CLINIC ONLY)
CD4 T CELL HELPER: 33 % (ref 33–55)
CD4 T Cell Abs: 830 /uL (ref 400–2700)

## 2018-02-11 LAB — RPR: RPR Ser Ql: NONREACTIVE

## 2018-02-12 ENCOUNTER — Other Ambulatory Visit: Payer: Self-pay | Admitting: Infectious Diseases

## 2018-02-12 DIAGNOSIS — A609 Anogenital herpesviral infection, unspecified: Secondary | ICD-10-CM

## 2018-02-12 LAB — HIV-1 RNA QUANT-NO REFLEX-BLD
HIV 1 RNA QUANT: NOT DETECTED {copies}/mL
HIV-1 RNA Quant, Log: <1.3 Log copies/mL

## 2018-02-13 ENCOUNTER — Encounter (HOSPITAL_COMMUNITY): Payer: Self-pay | Admitting: Internal Medicine

## 2018-04-14 ENCOUNTER — Ambulatory Visit (INDEPENDENT_AMBULATORY_CARE_PROVIDER_SITE_OTHER): Payer: BLUE CROSS/BLUE SHIELD | Admitting: Infectious Diseases

## 2018-04-14 ENCOUNTER — Encounter: Payer: Self-pay | Admitting: Infectious Diseases

## 2018-04-14 VITALS — BP 131/78 | HR 69 | Temp 97.7°F | Ht 66.0 in | Wt 181.0 lb

## 2018-04-14 DIAGNOSIS — Z23 Encounter for immunization: Secondary | ICD-10-CM | POA: Diagnosis not present

## 2018-04-14 DIAGNOSIS — Z113 Encounter for screening for infections with a predominantly sexual mode of transmission: Secondary | ICD-10-CM

## 2018-04-14 DIAGNOSIS — R739 Hyperglycemia, unspecified: Secondary | ICD-10-CM

## 2018-04-14 DIAGNOSIS — E782 Mixed hyperlipidemia: Secondary | ICD-10-CM

## 2018-04-14 DIAGNOSIS — B20 Human immunodeficiency virus [HIV] disease: Secondary | ICD-10-CM

## 2018-04-14 MED ORDER — PNEUMOCOCCAL 13-VAL CONJ VACC IM SUSP
0.5000 mL | INTRAMUSCULAR | Status: AC
  Administered 2018-04-14: 0.5 mL via INTRAMUSCULAR

## 2018-04-14 NOTE — [facility] Notes (Signed)
HPI: Luis Gomez is a 62 y.o. male presenting to clinic for an HIV follow-up. Pharmacy got consulted to assess drug interactions with [name] and his herbal medications.  Allergies: No Known Allergies  Vitals: Temp: 97.7 F (36.5 C) (06/10 0838) Temp Source: Oral (06/10 0838) BP: 131/78 (06/10 0838) Pulse Rate: 69 (06/10 2774)  Past Medical History: Past Medical History:  Diagnosis Date  . Diverticulosis   . Genital HSV   . GERD (gastroesophageal reflux disease)    Last EGD->07/02/06 normal  . HIV disease ([place])   . S/P colonoscopy 07/02/2006   Left sided diverticula    Social History: Social History   Socioeconomic History  . Marital status: Divorced    Spouse name: Not on file  . Number of children: 4  . Years of education: Not on file  . Highest education level: Not on file  Occupational History  . Occupation: Equity    Employer: [employer]  Social Needs  . Financial resource strain: Not on file  . Food insecurity:    Worry: Not on file    Inability: Not on file  . Transportation needs:    Medical: Not on file    Non-medical: Not on file  Tobacco Use  . Smoking status: Never Smoker  . Smokeless tobacco: Never Used  Substance and Sexual Activity  . Alcohol use: No  . Drug use: No  . Sexual activity: Not Currently    Comment: pt. given condoms  Lifestyle  . Physical activity:    Days per week: Not on file    Minutes per session: Not on file  . Stress: Not on file  Relationships  . Social connections:    Talks on phone: Not on file    Gets together: Not on file    Attends religious service: Not on file    Active member of club or organization: Not on file    Attends meetings of clubs or organizations: Not on file    Relationship status: Not on file  Other Topics Concern  . Not on file  Social History Narrative  . Not on file   Current Regimen: [name]   Labs: HIV 1 RNA Quant (copies/mL)  Date Value  02/10/2018 <20 NOT DETECTED   11/14/2016 <20  04/09/2016 <20   CD4 T Cell Abs (/uL)  Date Value  02/10/2018 830  11/14/2016 580  04/09/2016 760   Hep B S Ab (no units)  Date Value  09/27/2014 NEG   Hepatitis B Surface Ag (no units)  Date Value  12/30/2006 NO   HCV Ab (no units)  Date Value  12/30/2006 NO    CrCl: CrCl cannot be calculated (Patient's most recent lab result is older than the maximum 21 days allowed.).  Lipids:    Component Value Date/Time   CHOL 204 (H) 02/10/2018 0906   TRIG 209 (H) 02/10/2018 0906   HDL 36 (L) 02/10/2018 0906   CHOLHDL 5.7 (H) 02/10/2018 0906   VLDL 23 11/14/2016 1048   LDLCALC 133 (H) 02/10/2018 0906   Assessment: Dr. Johnnye Sima requested pharmacist help to assess and identify any possible interactions with [name] and his herbal medications. Patient is on milk thistle, saw palmetto, corn silk/parsley, and blue green algae herbal supplements for kidney, liver, and prostate health. We identified an issue with solely milk thistle with his [name] medication. Of note, this interaction also exists with Biktarvy.   Recommendations: Discontinue milk thistle and continue [name] therapy Other herbal supplements do not have  interactions, can continue these   Bridgett Larsson, PharmD, [facility] 04/14/2018, 9:12 AM

## 2018-04-14 NOTE — Progress Notes (Signed)
   Subjective:    Patient ID: Luis Gomez, male    DOB: [birth year], 62 y.o.   MRN: [id number]  HPI 62 yo M from [place] in [name] since 1982, with HIV+, on atripla --> genvoya (04-2015). Also hx of GERD, elevated Glc.  Had colonoscopy in 2019. Sessile serrated polyp, no dysplasia. Repeat in 5 years  Has been feeling well. Has been taking fish oil for his cholesterol.  Wants to start taking milk thistle, B12 multiple other vitamins.    HIV 1 RNA Quant (copies/mL)  Date Value  02/10/2018 <20 NOT DETECTED  11/14/2016 <20  04/09/2016 <20   CD4 T Cell Abs (/uL)  Date Value  02/10/2018 830  11/14/2016 580  04/09/2016 760    Review of Systems  Constitutional: Negative for appetite change and unexpected weight change.  Eyes: Negative for visual disturbance.  Gastrointestinal: Negative for constipation and diarrhea.  Genitourinary: Negative for difficulty urinating.  Psychiatric/Behavioral: Negative for sleep disturbance.  Please see HPI. All other systems reviewed and negative.      Objective:   Physical Exam  Constitutional: He is oriented to person, place, and time. He appears well-developed and well-nourished.  HENT:  Mouth/Throat: No oropharyngeal exudate.  Eyes: Pupils are equal, round, and reactive to light. EOM are normal.  Neck: Normal range of motion. Neck supple.  Cardiovascular: Normal rate, regular rhythm and normal heart sounds.  Pulmonary/Chest: Effort normal and breath sounds normal.  Abdominal: Soft. Bowel sounds are normal. There is no tenderness. There is no guarding.  Musculoskeletal: Normal range of motion.  Lymphadenopathy:    He has no cervical adenopathy.  Neurological: He is alert and oriented to person, place, and time.  Psychiatric: He has a normal mood and affect.       Assessment & Plan:

## 2018-04-14 NOTE — Assessment & Plan Note (Addendum)
He is doing well prevnar today.  Continue genvoya Offered condoms Pharm will speak with him about his multiple vitamins.  rtc in 9 months.

## 2018-04-14 NOTE — Assessment & Plan Note (Signed)
Will watch as he starts fish oil.

## 2018-04-14 NOTE — Assessment & Plan Note (Signed)
Continues to have slightly elevated Glc.  Will continue to monitor

## 2018-05-12 ENCOUNTER — Ambulatory Visit: Payer: BLUE CROSS/BLUE SHIELD | Admitting: Infectious Diseases

## 2018-07-08 DIAGNOSIS — J301 Allergic rhinitis due to pollen: Secondary | ICD-10-CM | POA: Diagnosis not present

## 2018-07-08 DIAGNOSIS — Z1389 Encounter for screening for other disorder: Secondary | ICD-10-CM | POA: Diagnosis not present

## 2018-07-08 DIAGNOSIS — Z6830 Body mass index (BMI) 30.0-30.9, adult: Secondary | ICD-10-CM | POA: Diagnosis not present

## 2018-07-08 DIAGNOSIS — J019 Acute sinusitis, unspecified: Secondary | ICD-10-CM | POA: Diagnosis not present

## 2018-07-08 DIAGNOSIS — E6609 Other obesity due to excess calories: Secondary | ICD-10-CM | POA: Diagnosis not present

## 2018-07-28 DIAGNOSIS — E663 Overweight: Secondary | ICD-10-CM | POA: Diagnosis not present

## 2018-07-28 DIAGNOSIS — J029 Acute pharyngitis, unspecified: Secondary | ICD-10-CM | POA: Diagnosis not present

## 2018-07-28 DIAGNOSIS — Z6829 Body mass index (BMI) 29.0-29.9, adult: Secondary | ICD-10-CM | POA: Diagnosis not present

## 2018-07-28 DIAGNOSIS — Z1389 Encounter for screening for other disorder: Secondary | ICD-10-CM | POA: Diagnosis not present

## 2018-07-28 DIAGNOSIS — J0300 Acute streptococcal tonsillitis, unspecified: Secondary | ICD-10-CM | POA: Diagnosis not present

## 2018-07-28 DIAGNOSIS — R51 Headache: Secondary | ICD-10-CM | POA: Diagnosis not present

## 2018-10-10 ENCOUNTER — Other Ambulatory Visit: Payer: Self-pay | Admitting: Pharmacist

## 2018-10-10 DIAGNOSIS — B20 Human immunodeficiency virus [HIV] disease: Secondary | ICD-10-CM

## 2018-10-10 MED ORDER — ELVITEG-COBIC-EMTRICIT-TENOFAF 150-150-200-10 MG PO TABS: 1.0000 | ORAL_TABLET | Freq: Every day | ORAL | 11 refills | Status: DC

## 2018-10-10 MED FILL — GENVOYA TABLET: 150-150-200 | 30 days supply | Qty: 30 | Fill #0

## 2018-10-10 NOTE — Progress Notes (Signed)
Transferring to [facility] so we can help with co-pay assistance.

## 2018-10-14 DIAGNOSIS — Z6829 Body mass index (BMI) 29.0-29.9, adult: Secondary | ICD-10-CM | POA: Diagnosis not present

## 2018-10-14 DIAGNOSIS — E663 Overweight: Secondary | ICD-10-CM | POA: Diagnosis not present

## 2018-10-14 DIAGNOSIS — Z1389 Encounter for screening for other disorder: Secondary | ICD-10-CM | POA: Diagnosis not present

## 2018-10-14 DIAGNOSIS — R0981 Nasal congestion: Secondary | ICD-10-CM | POA: Diagnosis not present

## 2018-10-14 DIAGNOSIS — J069 Acute upper respiratory infection, unspecified: Secondary | ICD-10-CM | POA: Diagnosis not present

## 2018-11-07 MED FILL — GENVOYA TABLET: 150-150-200 | 30 days supply | Qty: 30 | Fill #1

## 2018-11-19 DIAGNOSIS — Z6829 Body mass index (BMI) 29.0-29.9, adult: Secondary | ICD-10-CM | POA: Diagnosis not present

## 2018-11-19 DIAGNOSIS — Z1389 Encounter for screening for other disorder: Secondary | ICD-10-CM | POA: Diagnosis not present

## 2018-11-19 DIAGNOSIS — J329 Chronic sinusitis, unspecified: Secondary | ICD-10-CM | POA: Diagnosis not present

## 2018-11-19 DIAGNOSIS — B20 Human immunodeficiency virus [HIV] disease: Secondary | ICD-10-CM | POA: Diagnosis not present

## 2018-11-19 DIAGNOSIS — K219 Gastro-esophageal reflux disease without esophagitis: Secondary | ICD-10-CM | POA: Diagnosis not present

## 2018-11-25 DIAGNOSIS — R739 Hyperglycemia, unspecified: Secondary | ICD-10-CM | POA: Diagnosis not present

## 2018-11-25 DIAGNOSIS — E663 Overweight: Secondary | ICD-10-CM | POA: Diagnosis not present

## 2018-11-25 DIAGNOSIS — Z6829 Body mass index (BMI) 29.0-29.9, adult: Secondary | ICD-10-CM | POA: Diagnosis not present

## 2018-11-25 DIAGNOSIS — Z0000 Encounter for general adult medical examination without abnormal findings: Secondary | ICD-10-CM | POA: Diagnosis not present

## 2018-11-25 DIAGNOSIS — R7309 Other abnormal glucose: Secondary | ICD-10-CM | POA: Diagnosis not present

## 2018-11-25 DIAGNOSIS — Z1389 Encounter for screening for other disorder: Secondary | ICD-10-CM | POA: Diagnosis not present

## 2018-12-02 MED FILL — GENVOYA TABLET: 150-150-200 | 30 days supply | Qty: 30 | Fill #2

## 2018-12-16 DIAGNOSIS — R1032 Left lower quadrant pain: Secondary | ICD-10-CM | POA: Diagnosis not present

## 2018-12-16 DIAGNOSIS — Z1389 Encounter for screening for other disorder: Secondary | ICD-10-CM | POA: Diagnosis not present

## 2018-12-16 DIAGNOSIS — R1031 Right lower quadrant pain: Secondary | ICD-10-CM | POA: Diagnosis not present

## 2018-12-16 DIAGNOSIS — E6609 Other obesity due to excess calories: Secondary | ICD-10-CM | POA: Diagnosis not present

## 2018-12-16 DIAGNOSIS — Z6830 Body mass index (BMI) 30.0-30.9, adult: Secondary | ICD-10-CM | POA: Diagnosis not present

## 2018-12-29 MED FILL — GENVOYA TABLET: 150-150-200 | 30 days supply | Qty: 30 | Fill #3

## 2018-12-31 ENCOUNTER — Other Ambulatory Visit: Payer: BLUE CROSS/BLUE SHIELD

## 2018-12-31 ENCOUNTER — Other Ambulatory Visit (HOSPITAL_COMMUNITY)
Admission: RE | Admit: 2018-12-31 | Discharge: 2018-12-31 | Disposition: A | Discharge status: Home / Self Care | Payer: BLUE CROSS/BLUE SHIELD | Source: Ambulatory Visit | Attending: Infectious Diseases | Admitting: Infectious Diseases

## 2018-12-31 DIAGNOSIS — Z113 Encounter for screening for infections with a predominantly sexual mode of transmission: Secondary | ICD-10-CM | POA: Diagnosis not present

## 2018-12-31 DIAGNOSIS — E782 Mixed hyperlipidemia: Secondary | ICD-10-CM

## 2018-12-31 DIAGNOSIS — B20 Human immunodeficiency virus [HIV] disease: Secondary | ICD-10-CM

## 2019-01-01 LAB — T-HELPER CELL (CD4) - (RCID CLINIC ONLY)
CD4 % Helper T Cell: 29 % — ABNORMAL LOW (ref 33–55)
CD4 T Cell Abs: 900 /uL (ref 400–2700)

## 2019-01-01 LAB — URINE CYTOLOGY ANCILLARY ONLY
Chlamydia: NEGATIVE
Neisseria Gonorrhea: NEGATIVE

## 2019-01-03 LAB — CBC
HCT: 43.2 % (ref 38.5–50.0)
HEMOGLOBIN: 14.3 g/dL (ref 13.2–17.1)
MCH: 28.1 pg (ref 27.0–33.0)
MCHC: 33.1 g/dL (ref 32.0–36.0)
MCV: 84.9 fL (ref 80.0–100.0)
MPV: 9 fL (ref 7.5–12.5)
Platelets: 282 10*3/uL (ref 140–400)
RBC: 5.09 10*6/uL (ref 4.20–5.80)
RDW: 13 % (ref 11.0–15.0)
WBC: 6.8 10*3/uL (ref 3.8–10.8)

## 2019-01-03 LAB — COMPREHENSIVE METABOLIC PANEL
AG RATIO: 2 (calc) (ref 1.0–2.5)
ALT: 21 U/L (ref 9–46)
AST: 19 U/L (ref 10–35)
Albumin: 4.5 g/dL (ref 3.6–5.1)
Alkaline phosphatase (APISO): 63 U/L (ref 35–144)
BUN: 13 mg/dL (ref 7–25)
CO2: 29 mmol/L (ref 20–32)
CREATININE: 1.05 mg/dL (ref 0.70–1.25)
Calcium: 9.5 mg/dL (ref 8.6–10.3)
Chloride: 103 mmol/L (ref 98–110)
GLUCOSE: 99 mg/dL (ref 65–99)
Globulin: 2.3 g/dL (calc) (ref 1.9–3.7)
Potassium: 4.5 mmol/L (ref 3.5–5.3)
Sodium: 139 mmol/L (ref 135–146)
TOTAL PROTEIN: 6.8 g/dL (ref 6.1–8.1)
Total Bilirubin: 0.4 mg/dL (ref 0.2–1.2)

## 2019-01-03 LAB — LIPID PANEL
Cholesterol: 210 mg/dL — ABNORMAL HIGH (ref <200)
HDL: 44 mg/dL (ref >=40)
LDL Cholesterol (Calc): 137 mg/dL (calc) — ABNORMAL HIGH
NON-HDL CHOLESTEROL (CALC): 166 mg/dL — AB (ref <130)
Total CHOL/HDL Ratio: 4.8 (calc) (ref <5.0)
Triglycerides: 159 mg/dL — ABNORMAL HIGH (ref <150)

## 2019-01-03 LAB — HIV-1 RNA QUANT-NO REFLEX-BLD
HIV 1 RNA QUANT: DETECTED {copies}/mL — AB
HIV-1 RNA Quant, Log: <1.3 Log copies/mL — AB

## 2019-01-03 LAB — RPR: RPR Ser Ql: NONREACTIVE

## 2019-01-14 ENCOUNTER — Encounter: Payer: BLUE CROSS/BLUE SHIELD | Admitting: Infectious Diseases

## 2019-01-21 ENCOUNTER — Ambulatory Visit (INDEPENDENT_AMBULATORY_CARE_PROVIDER_SITE_OTHER): Payer: BLUE CROSS/BLUE SHIELD | Admitting: Infectious Diseases

## 2019-01-21 ENCOUNTER — Encounter: Payer: Self-pay | Admitting: Infectious Diseases

## 2019-01-21 ENCOUNTER — Other Ambulatory Visit: Payer: Self-pay

## 2019-01-21 VITALS — BP 153/84 | HR 80 | Temp 97.8°F | Wt 188.0 lb

## 2019-01-21 DIAGNOSIS — Z113 Encounter for screening for infections with a predominantly sexual mode of transmission: Secondary | ICD-10-CM | POA: Diagnosis not present

## 2019-01-21 DIAGNOSIS — I10 Essential (primary) hypertension: Secondary | ICD-10-CM

## 2019-01-21 DIAGNOSIS — E1365 Other specified diabetes mellitus with hyperglycemia: Secondary | ICD-10-CM

## 2019-01-21 DIAGNOSIS — Z79899 Other long term (current) drug therapy: Secondary | ICD-10-CM | POA: Diagnosis not present

## 2019-01-21 DIAGNOSIS — B20 Human immunodeficiency virus [HIV] disease: Secondary | ICD-10-CM

## 2019-01-21 DIAGNOSIS — IMO0002 Reserved for concepts with insufficient information to code with codable children: Secondary | ICD-10-CM

## 2019-01-21 DIAGNOSIS — E782 Mixed hyperlipidemia: Secondary | ICD-10-CM

## 2019-01-21 DIAGNOSIS — K219 Gastro-esophageal reflux disease without esophagitis: Secondary | ICD-10-CM

## 2019-01-21 DIAGNOSIS — E1329 Other specified diabetes mellitus with other diabetic kidney complication: Secondary | ICD-10-CM

## 2019-01-21 DIAGNOSIS — A609 Anogenital herpesviral infection, unspecified: Secondary | ICD-10-CM | POA: Diagnosis not present

## 2019-01-21 MED ORDER — VALACYCLOVIR HCL 500 MG PO TABS: 500.0000 mg | ORAL_TABLET | Freq: Two times a day (BID) | ORAL | 5 refills | Status: DC

## 2019-01-21 NOTE — Assessment & Plan Note (Signed)
Had PCP visit 2 months ago.  Last Glc normal.  Appreciate PCP f/u, on no meds.

## 2019-01-21 NOTE — Progress Notes (Signed)
   Subjective:    Patient ID: Luis Gomez, male    DOB: May 14, 1956, 63 y.o.   MRN: [id number]  HPI 63yo M from [place] in [name] since 1982, with HIV+, on atripla --> genvoya (04-2015). Also hx of GERD, elevated Glc.  Had colonoscopy in 2019. Sessile serrated polyp, no dysplasia. 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  Would like refill of his valtrex. No rashes, blisters.  His reflux has resolved- "as soon as I joined church and I was close to god, everything disappeared""I had this issue for 25 years".   HIV 1 RNA Quant (copies/mL)  Date Value  12/31/2018 <20 DETECTED (A)  02/10/2018 <20 NOT DETECTED  11/14/2016 <20   CD4 T Cell Abs (/uL)  Date Value  12/31/2018 900  02/10/2018 830  11/14/2016 580    Review of Systems  Constitutional: Negative for appetite change and unexpected weight change.  Eyes: Negative for visual disturbance.  Respiratory: Negative for cough and shortness of breath.   Cardiovascular: Negative for chest pain.  Gastrointestinal: Negative for constipation and diarrhea.  Genitourinary: Negative for difficulty urinating.  Neurological: Negative for headaches.  Please see HPI. All other systems reviewed and negative.      Objective:   Physical Exam Constitutional:      Appearance: Normal appearance.  HENT:     Mouth/Throat:     Mouth: Mucous membranes are moist.     Pharynx: No oropharyngeal exudate.  Eyes:     Extraocular Movements: Extraocular movements intact.     Pupils: Pupils are equal, round, and reactive to light.  Neck:     Musculoskeletal: Normal range of motion.  Cardiovascular:     Rate and Rhythm: Normal rate and regular rhythm.  Pulmonary:     Effort: Pulmonary effort is normal.     Breath sounds: Normal breath sounds.  Abdominal:     General: Abdomen is flat. Bowel sounds are normal. There is no distension.     Tenderness: There is no abdominal tenderness.  Musculoskeletal:     Right lower leg: No edema.     Left  lower leg: No edema.  Neurological:     General: No focal deficit present.     Mental Status: He is alert.  Psychiatric:        Mood and Affect: Mood normal.           Assessment & Plan:

## 2019-01-21 NOTE — Assessment & Plan Note (Signed)
Resolved

## 2019-01-21 NOTE — Assessment & Plan Note (Signed)
Doing very well.  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  rtc in 9 months.

## 2019-01-21 NOTE — Assessment & Plan Note (Signed)
Lab Results  Component Value Date   CHOL 210 (H) 12/31/2018   HDL 44 12/31/2018   LDLCALC 137 (H) 12/31/2018   TRIG 159 (H) 12/31/2018   CHOLHDL 4.8 12/31/2018    Seen by PCP. Will defer rx to his pcp Advised to watch his diet.

## 2019-01-21 NOTE — Assessment & Plan Note (Signed)
He attributes to allergy medication he is taking, news, being asked questions at front desk.  Will f/u at his next visit.

## 2019-01-21 NOTE — Assessment & Plan Note (Signed)
Currently asx Valtrex refilled.

## 2019-01-26 MED FILL — GENVOYA TABLET: 150-150-200 | 30 days supply | Qty: 30 | Fill #4

## 2019-02-23 MED FILL — GENVOYA TABLET: 150-150-200 | 30 days supply | Qty: 30 | Fill #5

## 2019-03-23 MED FILL — GENVOYA TABLET: 150-150-200 | 30 days supply | Qty: 30 | Fill #6

## 2019-04-20 MED FILL — GENVOYA TABLET: 150-150-200 | 30 days supply | Qty: 30 | Fill #7

## 2019-05-25 MED FILL — GENVOYA TABLET: 150-150-200 | 30 days supply | Qty: 30 | Fill #8

## 2019-06-23 MED FILL — GENVOYA TABLET: 150-150-200 | 30 days supply | Qty: 30 | Fill #9

## 2019-07-14 ENCOUNTER — Telehealth: Payer: Self-pay | Admitting: *Deleted

## 2019-07-14 NOTE — Telephone Encounter (Signed)
Patient's daughter Basilia Jumbo called on his behalf. Arin was diagnosed with Covid19 13 days ago.  Per [place], he is now noting dizziness, fatigue, general malaise, and feels his blood pressure is out of control.  He denies fever, chest pain or dyspnea.  She reports he is eating and drinking well.  He is taking [name] only. [name] states that a nurse has checked Luis Gomez's blood pressure (per [name] - 138/140 and 140/78).   RN asked that Dreyden go to the emergency room if his symptoms worsen, [name] agreed.  Please advise how to follow up for his feeling of general malaise, dizziness, fatigue. Landis Gandy, RN

## 2019-07-15 NOTE — Telephone Encounter (Signed)
Those sx may persist for a week or two after COVID. He needs to rest and eat as well as he can.  I agree with the plan for him to go to ED if he feels worse.  His BP seem ok (except for 138/140 which seems to be an error).  Thanks

## 2019-07-15 NOTE — Telephone Encounter (Signed)
Relayed to [place].  She agreed with the advice, stating that her husband is the same way (also recovering from Covid). RN will check back next week to see how he is feeling, reminded them to seek emergent care if he worsens.

## 2019-07-16 DIAGNOSIS — E6609 Other obesity due to excess calories: Secondary | ICD-10-CM | POA: Diagnosis not present

## 2019-07-16 DIAGNOSIS — J3489 Other specified disorders of nose and nasal sinuses: Secondary | ICD-10-CM | POA: Diagnosis not present

## 2019-07-16 DIAGNOSIS — J019 Acute sinusitis, unspecified: Secondary | ICD-10-CM | POA: Diagnosis not present

## 2019-07-16 DIAGNOSIS — Z6830 Body mass index (BMI) 30.0-30.9, adult: Secondary | ICD-10-CM | POA: Diagnosis not present

## 2019-07-22 ENCOUNTER — Telehealth: Payer: Self-pay | Admitting: *Deleted

## 2019-07-23 MED FILL — GENVOYA TABLET: 150-150-200 | 30 days supply | Qty: 30 | Fill #10

## 2019-07-23 NOTE — Telephone Encounter (Signed)
RN left message on [name]'s voicemail (patient's daughter) to check in and see how he was feeling this week. Landis Gandy, RN

## 2019-08-13 DIAGNOSIS — M545 Low back pain: Secondary | ICD-10-CM | POA: Diagnosis not present

## 2019-08-13 DIAGNOSIS — G5702 Lesion of sciatic nerve, left lower limb: Secondary | ICD-10-CM | POA: Diagnosis not present

## 2019-08-13 DIAGNOSIS — E6609 Other obesity due to excess calories: Secondary | ICD-10-CM | POA: Diagnosis not present

## 2019-08-13 DIAGNOSIS — Z6830 Body mass index (BMI) 30.0-30.9, adult: Secondary | ICD-10-CM | POA: Diagnosis not present

## 2019-08-20 DIAGNOSIS — Z6830 Body mass index (BMI) 30.0-30.9, adult: Secondary | ICD-10-CM | POA: Diagnosis not present

## 2019-08-20 DIAGNOSIS — J22 Unspecified acute lower respiratory infection: Secondary | ICD-10-CM | POA: Diagnosis not present

## 2019-08-20 DIAGNOSIS — E6609 Other obesity due to excess calories: Secondary | ICD-10-CM | POA: Diagnosis not present

## 2019-08-25 MED FILL — GENVOYA TABLET: 150-150-200 | 30 days supply | Qty: 30 | Fill #11

## 2019-09-14 ENCOUNTER — Other Ambulatory Visit: Payer: Self-pay | Admitting: Pharmacist

## 2019-09-14 DIAGNOSIS — B20 Human immunodeficiency virus [HIV] disease: Secondary | ICD-10-CM

## 2019-09-23 MED FILL — GENVOYA TABLET: 150-150-200 | 30 days supply | Qty: 30 | Fill #0

## 2019-10-09 ENCOUNTER — Other Ambulatory Visit: Payer: BC Managed Care – PPO

## 2019-10-09 ENCOUNTER — Other Ambulatory Visit (HOSPITAL_COMMUNITY)
Admission: RE | Admit: 2019-10-09 | Discharge: 2019-10-09 | Disposition: A | Discharge status: Home / Self Care | Payer: BC Managed Care – PPO | Source: Ambulatory Visit | Attending: Infectious Diseases | Admitting: Infectious Diseases

## 2019-10-09 ENCOUNTER — Other Ambulatory Visit: Payer: Self-pay

## 2019-10-09 DIAGNOSIS — Z113 Encounter for screening for infections with a predominantly sexual mode of transmission: Secondary | ICD-10-CM

## 2019-10-09 DIAGNOSIS — Z79899 Other long term (current) drug therapy: Secondary | ICD-10-CM | POA: Diagnosis not present

## 2019-10-09 DIAGNOSIS — B20 Human immunodeficiency virus [HIV] disease: Secondary | ICD-10-CM

## 2019-10-09 LAB — T-HELPER CELL (CD4) - (RCID CLINIC ONLY)
CD4 % Helper T Cell: 30 % — ABNORMAL LOW (ref 33–65)
CD4 T Cell Abs: 946 /uL (ref 400–1790)

## 2019-10-09 NOTE — Addendum Note (Signed)
Addended by: Dolan Amen D on: 10/09/2019 09:55 AM   Modules accepted: Orders

## 2019-10-12 DIAGNOSIS — J22 Unspecified acute lower respiratory infection: Secondary | ICD-10-CM | POA: Diagnosis not present

## 2019-10-12 DIAGNOSIS — Z6830 Body mass index (BMI) 30.0-30.9, adult: Secondary | ICD-10-CM | POA: Diagnosis not present

## 2019-10-12 DIAGNOSIS — E6609 Other obesity due to excess calories: Secondary | ICD-10-CM | POA: Diagnosis not present

## 2019-10-12 LAB — URINE CYTOLOGY ANCILLARY ONLY
Chlamydia: NEGATIVE
Comment: NEGATIVE
Comment: NORMAL
Neisseria Gonorrhea: NEGATIVE

## 2019-10-19 LAB — COMPREHENSIVE METABOLIC PANEL
AG Ratio: 1.9 (calc) (ref 1.0–2.5)
ALT: 24 U/L (ref 9–46)
AST: 18 U/L (ref 10–35)
Albumin: 4.5 g/dL (ref 3.6–5.1)
Alkaline phosphatase (APISO): 63 U/L (ref 35–144)
BUN: 13 mg/dL (ref 7–25)
CO2: 26 mmol/L (ref 20–32)
Calcium: 9.3 mg/dL (ref 8.6–10.3)
Chloride: 102 mmol/L (ref 98–110)
Creat: 1.01 mg/dL (ref 0.70–1.25)
Globulin: 2.4 g/dL (calc) (ref 1.9–3.7)
Glucose, Bld: 105 mg/dL — ABNORMAL HIGH (ref 65–99)
Potassium: 4.3 mmol/L (ref 3.5–5.3)
Sodium: 138 mmol/L (ref 135–146)
Total Bilirubin: 0.6 mg/dL (ref 0.2–1.2)
Total Protein: 6.9 g/dL (ref 6.1–8.1)

## 2019-10-19 LAB — LIPID PANEL
Cholesterol: 236 mg/dL — ABNORMAL HIGH (ref <200)
HDL: 40 mg/dL (ref >=40)
LDL Cholesterol (Calc): 161 mg/dL (calc) — ABNORMAL HIGH
Non-HDL Cholesterol (Calc): 196 mg/dL (calc) — ABNORMAL HIGH (ref <130)
Total CHOL/HDL Ratio: 5.9 (calc) — ABNORMAL HIGH (ref <5.0)
Triglycerides: 193 mg/dL — ABNORMAL HIGH (ref <150)

## 2019-10-19 LAB — CBC
HCT: 43 % (ref 38.5–50.0)
Hemoglobin: 14 g/dL (ref 13.2–17.1)
MCH: 27.9 pg (ref 27.0–33.0)
MCHC: 32.6 g/dL (ref 32.0–36.0)
MCV: 85.8 fL (ref 80.0–100.0)
MPV: 8.9 fL (ref 7.5–12.5)
Platelets: 270 10*3/uL (ref 140–400)
RBC: 5.01 10*6/uL (ref 4.20–5.80)
RDW: 13 % (ref 11.0–15.0)
WBC: 6.1 10*3/uL (ref 3.8–10.8)

## 2019-10-19 LAB — HIV-1 RNA QUANT-NO REFLEX-BLD
HIV 1 RNA Quant: <20 copies/mL — AB
HIV-1 RNA Quant, Log: <1.3 Log copies/mL — AB

## 2019-10-19 LAB — RPR: RPR Ser Ql: NONREACTIVE

## 2019-10-22 MED FILL — GENVOYA TABLET: 150-150-200 | 30 days supply | Qty: 30 | Fill #1

## 2019-10-23 ENCOUNTER — Encounter: Payer: BLUE CROSS/BLUE SHIELD | Admitting: Infectious Diseases

## 2019-11-23 MED FILL — GENVOYA TABLET: 150-150-200 | 30 days supply | Qty: 30 | Fill #2

## 2019-11-26 ENCOUNTER — Telehealth: Payer: Self-pay

## 2019-11-26 NOTE — Telephone Encounter (Signed)
COVID-19 Pre-Screening Questions:11/26/19  Do you currently have a fever (>100 F), chills or unexplained body aches? NO  Are you currently experiencing new cough, shortness of breath, sore throat, runny nose? NO  .  Have you recently travelled outside the state of [place] in the last 14 days? NO  .  Have you been in contact with someone that is currently pending confirmation of Covid19 testing or has been confirmed to have the [place] virus?  NO   **If the patient answers NO to ALL questions -  advise the patient to please call the clinic before coming to the office should any symptoms develop.

## 2019-11-27 ENCOUNTER — Encounter: Payer: Self-pay | Admitting: Infectious Diseases

## 2019-11-27 ENCOUNTER — Other Ambulatory Visit: Payer: Self-pay

## 2019-11-27 ENCOUNTER — Ambulatory Visit (INDEPENDENT_AMBULATORY_CARE_PROVIDER_SITE_OTHER): Payer: BC Managed Care – PPO | Admitting: Infectious Diseases

## 2019-11-27 VITALS — BP 142/83 | HR 88 | Temp 97.9°F | Wt 187.0 lb

## 2019-11-27 DIAGNOSIS — E1365 Other specified diabetes mellitus with hyperglycemia: Secondary | ICD-10-CM

## 2019-11-27 DIAGNOSIS — E1329 Other specified diabetes mellitus with other diabetic kidney complication: Secondary | ICD-10-CM

## 2019-11-27 DIAGNOSIS — IMO0002 Reserved for concepts with insufficient information to code with codable children: Secondary | ICD-10-CM

## 2019-11-27 DIAGNOSIS — I10 Essential (primary) hypertension: Secondary | ICD-10-CM | POA: Diagnosis not present

## 2019-11-27 DIAGNOSIS — Z79899 Other long term (current) drug therapy: Secondary | ICD-10-CM

## 2019-11-27 DIAGNOSIS — B20 Human immunodeficiency virus [HIV] disease: Secondary | ICD-10-CM | POA: Diagnosis not present

## 2019-11-27 DIAGNOSIS — E782 Mixed hyperlipidemia: Secondary | ICD-10-CM

## 2019-11-27 DIAGNOSIS — Z113 Encounter for screening for infections with a predominantly sexual mode of transmission: Secondary | ICD-10-CM

## 2019-11-27 NOTE — Progress Notes (Signed)
   Subjective:    Patient ID: Luis Gomez, male    DOB: 1956-10-01, 64 y.o.   MRN: [id number]  HPI 64yo M from [place] in [name] since 1982, with HIV+, on atripla -->genvoya (04-2015). Also hx of GERD, elevated Glc.  Had colonoscopy in 2019.Sessile serrated polyp, no dysplasia. Repeat in 5 years  Has been feeling good. 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  Is going to start exercise program, work on wt loss.    HIV 1 RNA Quant (copies/mL)  Date Value  10/09/2019 <20 DETECTED (A)  12/31/2018 <20 DETECTED (A)  02/10/2018 <20 NOT DETECTED   CD4 T Cell Abs (/uL)  Date Value  10/09/2019 946  12/31/2018 900  02/10/2018 830    Review of Systems  Constitutional: Negative for appetite change, fever and unexpected weight change.  Respiratory: Negative for cough and shortness of breath.   Gastrointestinal: Negative for constipation and diarrhea.  Genitourinary: Negative for difficulty urinating.  Psychiatric/Behavioral: Negative for sleep disturbance.       Objective:   Physical Exam Vitals reviewed.  Constitutional:      Appearance: Normal appearance.  HENT:     Mouth/Throat:     Mouth: Mucous membranes are moist.     Pharynx: No oropharyngeal exudate.  Eyes:     Extraocular Movements: Extraocular movements intact.     Pupils: Pupils are equal, round, and reactive to light.  Cardiovascular:     Rate and Rhythm: Normal rate and regular rhythm.  Pulmonary:     Effort: Pulmonary effort is normal.     Breath sounds: Normal breath sounds.  Abdominal:     General: Bowel sounds are normal. There is no distension.     Palpations: Abdomen is soft.     Tenderness: There is no abdominal tenderness.  Musculoskeletal:        General: Normal range of motion.     Cervical back: Normal range of motion.     Right lower leg: No edema.     Left lower leg: No edema.  Neurological:     General: No focal deficit present.     Mental Status: He is alert.  Psychiatric:        Mood and Affect:  Mood normal.           Assessment & Plan:

## 2019-11-27 NOTE — Assessment & Plan Note (Signed)
Slightly elevated today Will work on wt loss, diet, exercise.

## 2019-11-27 NOTE — Assessment & Plan Note (Signed)
boarderline today Will work on diet and exercise.

## 2019-11-27 NOTE — Assessment & Plan Note (Signed)
Doing well  Explained labs Wants to check PSA with next visit.  Will givehim copy of his labs Will give him condoms Will give him PSV23 at next visit.  Will see him back in 9 months with labs prior.

## 2019-11-27 NOTE — Assessment & Plan Note (Signed)
eleveated today Will work on diet, exercise, wt loss

## 2019-12-01 DIAGNOSIS — E6609 Other obesity due to excess calories: Secondary | ICD-10-CM | POA: Diagnosis not present

## 2019-12-01 DIAGNOSIS — Z0000 Encounter for general adult medical examination without abnormal findings: Secondary | ICD-10-CM | POA: Diagnosis not present

## 2019-12-01 DIAGNOSIS — Z6830 Body mass index (BMI) 30.0-30.9, adult: Secondary | ICD-10-CM | POA: Diagnosis not present

## 2019-12-01 DIAGNOSIS — Z1389 Encounter for screening for other disorder: Secondary | ICD-10-CM | POA: Diagnosis not present

## 2019-12-01 DIAGNOSIS — R7301 Impaired fasting glucose: Secondary | ICD-10-CM | POA: Diagnosis not present

## 2019-12-15 DIAGNOSIS — M5410 Radiculopathy, site unspecified: Secondary | ICD-10-CM | POA: Diagnosis not present

## 2019-12-15 DIAGNOSIS — E6609 Other obesity due to excess calories: Secondary | ICD-10-CM | POA: Diagnosis not present

## 2019-12-15 DIAGNOSIS — Z6830 Body mass index (BMI) 30.0-30.9, adult: Secondary | ICD-10-CM | POA: Diagnosis not present

## 2019-12-21 MED FILL — GENVOYA TABLET: 150-150-200 | 30 days supply | Qty: 30 | Fill #3

## 2020-01-19 ENCOUNTER — Other Ambulatory Visit: Payer: Self-pay

## 2020-01-19 ENCOUNTER — Other Ambulatory Visit (HOSPITAL_COMMUNITY): Payer: Self-pay | Admitting: Family Medicine

## 2020-01-19 ENCOUNTER — Ambulatory Visit (HOSPITAL_COMMUNITY)
Admission: RE | Admit: 2020-01-19 | Discharge: 2020-01-19 | Disposition: A | Discharge status: Home / Self Care | Payer: BC Managed Care – PPO | Source: Ambulatory Visit | Attending: Family Medicine | Admitting: Family Medicine

## 2020-01-19 DIAGNOSIS — M79605 Pain in left leg: Secondary | ICD-10-CM

## 2020-01-19 DIAGNOSIS — B20 Human immunodeficiency virus [HIV] disease: Secondary | ICD-10-CM

## 2020-01-19 DIAGNOSIS — Z6830 Body mass index (BMI) 30.0-30.9, adult: Secondary | ICD-10-CM | POA: Diagnosis not present

## 2020-01-19 DIAGNOSIS — E6609 Other obesity due to excess calories: Secondary | ICD-10-CM | POA: Diagnosis not present

## 2020-01-19 MED ORDER — GENVOYA 150-150-200-10 MG PO TABS: 1.0000 | ORAL_TABLET | Freq: Every day | ORAL | 3 refills | Status: DC

## 2020-01-22 MED FILL — GENVOYA TABLET: 150-150-200 | 30 days supply | Qty: 30 | Fill #0

## 2020-02-17 DIAGNOSIS — Z23 Encounter for immunization: Secondary | ICD-10-CM | POA: Diagnosis not present

## 2020-02-18 MED FILL — GENVOYA TABLET: 150-150-200 | 30 days supply | Qty: 30 | Fill #1

## 2020-02-29 DIAGNOSIS — M545 Low back pain: Secondary | ICD-10-CM | POA: Diagnosis not present

## 2020-02-29 DIAGNOSIS — M79662 Pain in left lower leg: Secondary | ICD-10-CM | POA: Diagnosis not present

## 2020-03-07 DIAGNOSIS — M545 Low back pain: Secondary | ICD-10-CM | POA: Diagnosis not present

## 2020-03-15 DIAGNOSIS — Z23 Encounter for immunization: Secondary | ICD-10-CM | POA: Diagnosis not present

## 2020-03-21 DIAGNOSIS — M545 Low back pain: Secondary | ICD-10-CM | POA: Diagnosis not present

## 2020-03-28 DIAGNOSIS — M79662 Pain in left lower leg: Secondary | ICD-10-CM | POA: Diagnosis not present

## 2020-03-28 DIAGNOSIS — M545 Low back pain: Secondary | ICD-10-CM | POA: Diagnosis not present

## 2020-03-31 ENCOUNTER — Other Ambulatory Visit: Payer: Self-pay

## 2020-03-31 ENCOUNTER — Observation Stay (HOSPITAL_COMMUNITY)
Admission: EM | Admit: 2020-03-31 | Discharge: 2020-04-01 | Disposition: A | Discharge status: Home / Self Care | Payer: BC Managed Care – PPO | Attending: Family Medicine | Admitting: Family Medicine

## 2020-03-31 ENCOUNTER — Encounter (HOSPITAL_COMMUNITY): Payer: Self-pay | Admitting: Emergency Medicine

## 2020-03-31 ENCOUNTER — Emergency Department (HOSPITAL_COMMUNITY): Payer: BC Managed Care – PPO

## 2020-03-31 DIAGNOSIS — I119 Hypertensive heart disease without heart failure: Secondary | ICD-10-CM | POA: Insufficient documentation

## 2020-03-31 DIAGNOSIS — R0789 Other chest pain: Secondary | ICD-10-CM | POA: Diagnosis not present

## 2020-03-31 DIAGNOSIS — K2100 Gastro-esophageal reflux disease with esophagitis, without bleeding: Secondary | ICD-10-CM | POA: Diagnosis not present

## 2020-03-31 DIAGNOSIS — E785 Hyperlipidemia, unspecified: Secondary | ICD-10-CM | POA: Insufficient documentation

## 2020-03-31 DIAGNOSIS — R079 Chest pain, unspecified: Secondary | ICD-10-CM

## 2020-03-31 DIAGNOSIS — Z21 Asymptomatic human immunodeficiency virus [HIV] infection status: Secondary | ICD-10-CM | POA: Diagnosis not present

## 2020-03-31 DIAGNOSIS — K222 Esophageal obstruction: Secondary | ICD-10-CM | POA: Insufficient documentation

## 2020-03-31 DIAGNOSIS — K449 Diaphragmatic hernia without obstruction or gangrene: Secondary | ICD-10-CM | POA: Insufficient documentation

## 2020-03-31 DIAGNOSIS — Z791 Long term (current) use of non-steroidal anti-inflammatories (NSAID): Secondary | ICD-10-CM | POA: Diagnosis not present

## 2020-03-31 DIAGNOSIS — Z20822 Contact with and (suspected) exposure to covid-19: Secondary | ICD-10-CM | POA: Insufficient documentation

## 2020-03-31 DIAGNOSIS — E119 Type 2 diabetes mellitus without complications: Secondary | ICD-10-CM | POA: Diagnosis not present

## 2020-03-31 DIAGNOSIS — I2510 Atherosclerotic heart disease of native coronary artery without angina pectoris: Secondary | ICD-10-CM | POA: Diagnosis not present

## 2020-03-31 DIAGNOSIS — Z7982 Long term (current) use of aspirin: Secondary | ICD-10-CM | POA: Insufficient documentation

## 2020-03-31 DIAGNOSIS — Z79899 Other long term (current) drug therapy: Secondary | ICD-10-CM | POA: Insufficient documentation

## 2020-03-31 DIAGNOSIS — I082 Rheumatic disorders of both aortic and tricuspid valves: Secondary | ICD-10-CM | POA: Insufficient documentation

## 2020-03-31 DIAGNOSIS — K219 Gastro-esophageal reflux disease without esophagitis: Secondary | ICD-10-CM | POA: Diagnosis present

## 2020-03-31 DIAGNOSIS — B20 Human immunodeficiency virus [HIV] disease: Secondary | ICD-10-CM | POA: Diagnosis present

## 2020-03-31 DIAGNOSIS — K209 Esophagitis, unspecified without bleeding: Secondary | ICD-10-CM | POA: Diagnosis not present

## 2020-03-31 LAB — BASIC METABOLIC PANEL
Anion gap: 13 (ref 5–15)
BUN: 13 mg/dL (ref 8–23)
CO2: 25 mmol/L (ref 22–32)
Calcium: 9.2 mg/dL (ref 8.9–10.3)
Chloride: 97 mmol/L — ABNORMAL LOW (ref 98–111)
Creatinine, Ser: 0.98 mg/dL (ref 0.61–1.24)
GFR calc Af Amer: >60 mL/min (ref >60)
GFR calc non Af Amer: >60 mL/min (ref >60)
Glucose, Bld: 126 mg/dL — ABNORMAL HIGH (ref 70–99)
Potassium: 3.6 mmol/L (ref 3.5–5.1)
Sodium: 135 mmol/L (ref 135–145)

## 2020-03-31 LAB — CBC
HCT: 44.2 % (ref 39.0–52.0)
Hemoglobin: 14.4 g/dL (ref 13.0–17.0)
MCH: 28.1 pg (ref 26.0–34.0)
MCHC: 32.6 g/dL (ref 30.0–36.0)
MCV: 86.3 fL (ref 80.0–100.0)
Platelets: 317 10*3/uL (ref 150–400)
RBC: 5.12 MIL/uL (ref 4.22–5.81)
RDW: 12.6 % (ref 11.5–15.5)
WBC: 8.6 10*3/uL (ref 4.0–10.5)
nRBC: 0 % (ref 0.0–0.2)

## 2020-03-31 LAB — TROPONIN I (HIGH SENSITIVITY): Troponin I (High Sensitivity): 2 ng/L (ref <18)

## 2020-03-31 MED ORDER — ALUM & MAG HYDROXIDE-SIMETH 200-200-20 MG/5ML PO SUSP
30.0000 mL | Freq: Once | ORAL | Status: AC
  Administered 2020-04-01: 30 mL via ORAL
  Filled 2020-03-31: qty 30

## 2020-03-31 MED ORDER — SODIUM CHLORIDE 0.9% FLUSH: 3.0000 mL | Freq: Once | INTRAVENOUS | Status: DC

## 2020-03-31 MED ORDER — ASPIRIN 81 MG PO CHEW
324.0000 mg | CHEWABLE_TABLET | Freq: Once | ORAL | Status: AC
  Administered 2020-04-01: 324 mg via ORAL
  Filled 2020-03-31: qty 4

## 2020-03-31 MED ORDER — LIDOCAINE VISCOUS HCL 2 % MT SOLN
15.0000 mL | Freq: Once | OROMUCOSAL | Status: AC
  Administered 2020-04-01: 15 mL via ORAL
  Filled 2020-03-31: qty 15

## 2020-03-31 MED FILL — GENVOYA TABLET: 150-150-200 | 30 days supply | Qty: 30 | Fill #2

## 2020-03-31 NOTE — ED Triage Notes (Signed)
Pt C/O reflux X 3 days. Pt states whenever he tries to eat anything it comes back up. Pt also reports mild chest pain.

## 2020-03-31 NOTE — ED Provider Notes (Signed)
[facility] EMERGENCY DEPARTMENT Provider Note   CSN: [id number] Arrival date & time: 03/31/20  2125     History Chief Complaint  Patient presents with  . Gastroesophageal Reflux    Luis Gomez is a 64 y.o. male.  Level 5 caveat for language barrier.  Translator used.  Patient with history of acid reflux disease, hypertension, hyperlipidemia, diabetes presenting with chest pain and nausea and vomiting.  Significant other at bedside states he is been sick for several days with "spitting up like a baby".  He been vomiting small amounts and having significant nausea.  Estimates 5 or 6 episodes of vomiting today.  No black or bloody stools.  Patient had brief episode of chest pain that tonight this evening while resting it came on after eating.  With burning pain that lasted for 2 to 3 hours and is since resolved.  Radiated to his throat and upper back.  He denies any chest pain currently.  No shortness of breath or diaphoresis.  He reports no issues with swallowing and is able to swallow food and liquids without issue.  His significant other states he was on prednisone earlier this week for sciatica that may be making him sick.  Denies any history of documented reflux or ulcers but he is a poor historian.  Denies any cardiac history.  The pain is not exertional or pleuritic.  There is no abdominal pain.  No previous abdominal surgeries.  The history is provided by the patient. The history is limited by a language barrier. A language interpreter was used.       Past Medical History:  Diagnosis Date  . Diverticulosis   . Genital HSV   . GERD (gastroesophageal reflux disease)    Last EGD->07/02/06 normal  . HIV disease ([place])   . S/P colonoscopy 07/02/2006   Left sided diverticula    Patient Active Problem List   Diagnosis Date Noted  . Hypertension 01/21/2019  . Hyperlipidemia 08/12/2017  . Hematuria, microscopic 09/27/2014  . Onychomycosis of toenail 09/07/2013  . HSV  (herpes simplex virus) anogenital infection 04/02/2012  . Impotence due to erectile dysfunction 11/07/2011  . Hematochezia 09/20/2011  . LIPOMA 11/13/2010  . CHEST PAIN, ATYPICAL 08/14/2010  . BURN, ARM 04/18/2010  . CARBUNCLE AND FURUNCLE OF UNSPECIFIED SITE 06/10/2009  . 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 JAW PAIN 01/22/2007  . GERD 01/22/2007  . Human immunodeficiency virus (HIV) disease ([place]) 01/16/2007  . 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 ERECTILE DYSFUNCTION 01/16/2007  . ALLERGIC RHINITIS 01/16/2007  . LAMINECTOMY, HX OF 01/16/2007    Past Surgical History:  Procedure Laterality Date  . BACK SURGERY    . COLONOSCOPY  07/03/2006   Normal rectum, left-sided diverticulum  . COLONOSCOPY N/A 02/07/2018   Procedure: COLONOSCOPY;  Surgeon: Daneil Dolin, MD;  Location: [facility];  Service: Endoscopy;  Laterality: N/A;  7:30 - pt knows to arrive at 7:00  . ESOPHAGOGASTRODUODENOSCOPY  07/03/2006   Normal esophagus, stomach, D1, D2.  Marland Kitchen POLYPECTOMY  02/07/2018   Procedure: POLYPECTOMY;  Surgeon: Daneil Dolin, MD;  Location: [facility];  Service: Endoscopy;;  colon        No family history on file.  Social History   Tobacco Use  . Smoking status: Never Smoker  . Smokeless tobacco: Never Used  Substance Use Topics  . Alcohol use: No  . Drug use: No  Home Medications Prior to Admission medications   Medication Sig Start Date End Date Taking? Authorizing Provider  aspirin EC 81 MG tablet Take 1 tablet (81 mg total) by mouth daily. 05/24/15   Campbell Riches, MD  elvitegravir-cobicistat-emtricitabine-tenofovir ([name]) 150-150-200-10 MG TABS tablet Take 1 tablet by mouth daily with breakfast. 01/19/20   Campbell Riches, MD  Omega-3 Fatty Acids (FISH OIL) 1000 MG CAPS Take 1,000 mg by mouth daily.     [provider]  sildenafil (REVATIO) 20 MG tablet  Take 10 mg by mouth daily as needed (for ED).     [provider]  valACYclovir (VALTREX) 500 MG tablet Take 1 tablet (500 mg total) by mouth 2 (two) times daily. 01/21/19   Campbell Riches, MD    Allergies    Patient has no known allergies.  Review of Systems   Review of Systems  Constitutional: Positive for activity change and appetite change. Negative for fatigue and fever.  HENT: Negative for congestion and rhinorrhea.   Respiratory: Negative for cough, chest tightness and shortness of breath.   Cardiovascular: Positive for chest pain.  Gastrointestinal: Positive for nausea and vomiting.  Genitourinary: Negative for dysuria and hematuria.  Musculoskeletal: Negative for arthralgias and myalgias.  Skin: Negative for rash.  Neurological: Negative for dizziness, weakness and headaches.   all other systems are negative except as noted in the HPI and PMH.    Physical Exam Updated Vital Signs BP (!) 152/95 (BP Location: Right Arm)   Pulse 83   Temp 98.7 F (37.1 C) (Oral)   Resp 17   Ht 5\' 6"  (1.676 m)   Wt 86.2 kg   SpO2 96%   BMI 30.67 kg/m   Physical Exam Vitals and nursing note reviewed.  Constitutional:      General: He is not in acute distress.    Appearance: He is well-developed.  HENT:     Head: Normocephalic and atraumatic.     Mouth/Throat:     Pharynx: No oropharyngeal exudate.  Eyes:     Conjunctiva/sclera: Conjunctivae normal.     Pupils: Pupils are equal, round, and reactive to light.  Neck:     Comments: No meningismus. Cardiovascular:     Rate and Rhythm: Normal rate and regular rhythm.     Heart sounds: Normal heart sounds. No murmur.  Pulmonary:     Effort: Pulmonary effort is normal. No respiratory distress.     Breath sounds: Normal breath sounds.  Abdominal:     Palpations: Abdomen is soft.     Tenderness: There is no abdominal tenderness. There is no guarding or rebound.  Musculoskeletal:        General: No tenderness. Normal  range of motion.     Cervical back: Normal range of motion and neck supple.  Skin:    General: Skin is warm.  Neurological:     Mental Status: He is alert and oriented to person, place, and time.     Cranial Nerves: No cranial nerve deficit.     Motor: No abnormal muscle tone.     Coordination: Coordination normal.     Comments: No ataxia on finger to nose bilaterally. No pronator drift. 5/5 strength throughout. CN 2-12 intact.Equal grip strength. Sensation intact.   Psychiatric:        Behavior: Behavior normal.     ED Results / Procedures / Treatments   Labs (all labs ordered are listed, but only abnormal results are displayed) Labs Reviewed  BASIC  METABOLIC PANEL - Abnormal; Notable for the following components:      Result Value   Chloride 97 (*)    Glucose, Bld 126 (*)    All other components within normal limits  SARS CORONAVIRUS 2 BY RT PCR (HOSPITAL ORDER, [place] [facility])  CBC  HEPATIC FUNCTION PANEL  LIPASE, BLOOD  TROPONIN I (HIGH SENSITIVITY)  TROPONIN I (HIGH SENSITIVITY)    EKG EKG Interpretation  Date/Time:  Thursday Mar 31 2020 22:07:40 EDT Ventricular Rate:  90 PR Interval:  142 QRS Duration: 88 QT Interval:  360 QTC Calculation: 440 R Axis:   -21 Text Interpretation: Normal sinus rhythm Normal ECG No significant change was found Confirmed by Ezequiel Essex [id number]) on 03/31/2020 10:49:23 PM   Radiology DG Chest 2 View  Result Date: 03/31/2020 CLINICAL DATA:  Chest pain EXAM: CHEST - 2 VIEW COMPARISON:  10/29/2015 FINDINGS: The heart size and mediastinal contours are within normal limits. Both lungs are clear. Small calcified lung nodule at the right base. The visualized skeletal structures are unremarkable. IMPRESSION: No active cardiopulmonary disease. Electronically Signed   By: Donavan Foil M.D.   On: 03/31/2020 22:25   CT Chest W Contrast  Result Date: 04/01/2020 CLINICAL DATA:  Epigastric and mid chest pain EXAM: CT  CHEST, ABDOMEN, AND PELVIS WITH CONTRAST TECHNIQUE: Multidetector CT imaging of the chest, abdomen and pelvis was performed following the standard protocol during bolus administration of intravenous contrast. CONTRAST:  181mL OMNIPAQUE IOHEXOL 300 MG/ML  SOLN COMPARISON:  Chest radiograph 03/31/2020, CT 06/05/2011 FINDINGS: CT CHEST FINDINGS Cardiovascular: Normal heart size. No pericardial effusion. Calcifications seen left coronary artery. The aorta is normal caliber. Normal 3 vessel branching of the aortic arch. Proximal great vessels are unremarkable. Central pulmonary arteries are normal caliber. No large central or lobar filling defects on this non tailored examination pulmonary arteries. Mediastinum/Nodes: Normal thyroid gland and thoracic inlet. No acute abnormality of the trachea. Mild distal esophageal thickening without paraesophageal, fluid or gas. Few calcified mediastinal and hilar nodes are present, likely sequela of remote granulomatous disease. No worrisome mediastinal, hilar or axillary adenopathy. Lungs/Pleura: Few calcified granulomata in the lungs (3/100). No suspicious pulmonary nodules or masses. No consolidation, features of edema, pneumothorax, or effusion. Musculoskeletal: Benign-appearing sclerotic focus in the proximal right humerus, partially collimated from cross-sectional imaging. No acute osseous abnormality or suspicious osseous lesion. No suspicious chest wall abnormalities. CT ABDOMEN PELVIS FINDINGS Hepatobiliary: No worrisome focal liver lesions. Smooth liver surface contour. Normal hepatic attenuation. Calcified gallstone seen within the proximal body of the gallbladder. No pericholecystic fluid or inflammation. No intraductal gallstones or biliary dilatation. Pancreas: Unremarkable. No pancreatic ductal dilatation or surrounding inflammatory changes. Spleen: Normal in size without focal abnormality. Adrenals/Urinary Tract: Normal adrenal glands. Kidneys are normally located  with symmetric enhancement and excretion. Small focus of scarring in the posterior left upper pole. No suspicious renal lesion, urolithiasis or hydronephrosis. Urinary bladder is unremarkable aside from mild indentation of the bladder base by the prostate. Stomach/Bowel: Distal esophageal thickening, as above. Stomach and duodenum are unremarkable. No small bowel thickening or dilatation. A normal appendix is visualized. No proximal colonic dilatation or wall thickening. There are scattered distal colonic diverticula with more long segmental thickening of the sigmoid colon but without associated inflammation at this time. Vascular/Lymphatic: Minimal plaque in the distal aorta and iliac arteries. No other significant vascular findings. No suspicious or enlarged lymph nodes in the included lymphatic chains. Reproductive: Prostate at the upper limits of normal  for size. Coarse eccentric calcification of the prostate. No concerning abnormalities of the prostate or seminal vesicles. Other: No abdominopelvic free fluid or free gas. No bowel containing hernias. Small fat containing left inguinal hernia. Musculoskeletal: No acute osseous abnormality or suspicious osseous lesion. Stable bone island in the left pubic body. Multilevel degenerative changes are present in the imaged portions of the spine. IMPRESSION: 1. Mild distal esophageal thickening without paraesophageal, fluid or gas. Findings may represent esophagitis, correlate with symptoms and consider direct visualization. 2. Scattered distal colonic diverticula with segmental thickening of the sigmoid without active inflammation at this time, may reflect sequela of prior diverticular inflammation though should consider correlation with prior colonoscopy if recently performed or direct visualization on an outpatient basis. 3. No other acute findings in the chest, abdomen or pelvis. 4. Cholelithiasis without CT evidence of acute cholecystitis. 5. Aortic Atherosclerosis  (ICD10-I70.0). 6. Coronary artery calcifications are present. Please note that the presence of coronary artery calcium documents the presence of coronary artery disease, the severity of this disease and any potential stenosis cannot be assessed on this non-gated CT examination. Assessment for potential risk factor modification, dietary therapy or pharmacologic therapy may be warranted. Electronically Signed   By: Lovena Le M.D.   On: 04/01/2020 01:13   CT ABDOMEN PELVIS W CONTRAST  Result Date: 04/01/2020 CLINICAL DATA:  Epigastric and mid chest pain EXAM: CT CHEST, ABDOMEN, AND PELVIS WITH CONTRAST TECHNIQUE: Multidetector CT imaging of the chest, abdomen and pelvis was performed following the standard protocol during bolus administration of intravenous contrast. CONTRAST:  146mL OMNIPAQUE IOHEXOL 300 MG/ML  SOLN COMPARISON:  Chest radiograph 03/31/2020, CT 06/05/2011 FINDINGS: CT CHEST FINDINGS Cardiovascular: Normal heart size. No pericardial effusion. Calcifications seen left coronary artery. The aorta is normal caliber. Normal 3 vessel branching of the aortic arch. Proximal great vessels are unremarkable. Central pulmonary arteries are normal caliber. No large central or lobar filling defects on this non tailored examination pulmonary arteries. Mediastinum/Nodes: Normal thyroid gland and thoracic inlet. No acute abnormality of the trachea. Mild distal esophageal thickening without paraesophageal, fluid or gas. Few calcified mediastinal and hilar nodes are present, likely sequela of remote granulomatous disease. No worrisome mediastinal, hilar or axillary adenopathy. Lungs/Pleura: Few calcified granulomata in the lungs (3/100). No suspicious pulmonary nodules or masses. No consolidation, features of edema, pneumothorax, or effusion. Musculoskeletal: Benign-appearing sclerotic focus in the proximal right humerus, partially collimated from cross-sectional imaging. No acute osseous abnormality or suspicious  osseous lesion. No suspicious chest wall abnormalities. CT ABDOMEN PELVIS FINDINGS Hepatobiliary: No worrisome focal liver lesions. Smooth liver surface contour. Normal hepatic attenuation. Calcified gallstone seen within the proximal body of the gallbladder. No pericholecystic fluid or inflammation. No intraductal gallstones or biliary dilatation. Pancreas: Unremarkable. No pancreatic ductal dilatation or surrounding inflammatory changes. Spleen: Normal in size without focal abnormality. Adrenals/Urinary Tract: Normal adrenal glands. Kidneys are normally located with symmetric enhancement and excretion. Small focus of scarring in the posterior left upper pole. No suspicious renal lesion, urolithiasis or hydronephrosis. Urinary bladder is unremarkable aside from mild indentation of the bladder base by the prostate. Stomach/Bowel: Distal esophageal thickening, as above. Stomach and duodenum are unremarkable. No small bowel thickening or dilatation. A normal appendix is visualized. No proximal colonic dilatation or wall thickening. There are scattered distal colonic diverticula with more long segmental thickening of the sigmoid colon but without associated inflammation at this time. Vascular/Lymphatic: Minimal plaque in the distal aorta and iliac arteries. No other significant vascular findings. No suspicious or  enlarged lymph nodes in the included lymphatic chains. Reproductive: Prostate at the upper limits of normal for size. Coarse eccentric calcification of the prostate. No concerning abnormalities of the prostate or seminal vesicles. Other: No abdominopelvic free fluid or free gas. No bowel containing hernias. Small fat containing left inguinal hernia. Musculoskeletal: No acute osseous abnormality or suspicious osseous lesion. Stable bone island in the left pubic body. Multilevel degenerative changes are present in the imaged portions of the spine. IMPRESSION: 1. Mild distal esophageal thickening without  paraesophageal, fluid or gas. Findings may represent esophagitis, correlate with symptoms and consider direct visualization. 2. Scattered distal colonic diverticula with segmental thickening of the sigmoid without active inflammation at this time, may reflect sequela of prior diverticular inflammation though should consider correlation with prior colonoscopy if recently performed or direct visualization on an outpatient basis. 3. No other acute findings in the chest, abdomen or pelvis. 4. Cholelithiasis without CT evidence of acute cholecystitis. 5. Aortic Atherosclerosis (ICD10-I70.0). 6. Coronary artery calcifications are present. Please note that the presence of coronary artery calcium documents the presence of coronary artery disease, the severity of this disease and any potential stenosis cannot be assessed on this non-gated CT examination. Assessment for potential risk factor modification, dietary therapy or pharmacologic therapy may be warranted. Electronically Signed   By: Lovena Le M.D.   On: 04/01/2020 01:13    Procedures Procedures (including critical care time)  Medications Ordered in ED Medications  sodium chloride flush (NS) 0.9 % injection 3 mL (3 mLs Intravenous Not Given 03/31/20 2241)  aspirin chewable tablet 324 mg (has no administration in time range)  alum & mag hydroxide-simeth (MAALOX/MYLANTA) 200-200-20 MG/5ML suspension 30 mL (has no administration in time range)    And  lidocaine (XYLOCAINE) 2 % viscous mouth solution 15 mL (has no administration in time range)    ED Course  I have reviewed the triage vital signs and the nursing notes.  Pertinent labs & imaging results that were available during my care of the patient were reviewed by me and considered in my medical decision making (see chart for details).    MDM Rules/Calculators/A&P                     Nausea and vomiting in setting of prednisone use with multiple episodes of vomiting today.  Episode of chest pain  earlier which has since resolved.  EKG without acute ischemia.  Suspect chest pain likely secondary to vomiting but cannot rule out ACS.  CD4 count was 946 December 2020.  Patient had EGD in 2007 by report and was reportedly normal but these results not available.  Troponin negative.  Lipase normal, LFTs normal.  Aspirin and GI cocktail given.  CT scan shows evidence of esophagitis as well as coronary artery disease.  Heart score is 4.  Patient's chest pain seems atypical for ACS and is associated when he was eating and on prednisone.  Does have nausea and vomiting likely from reflux and esophagitis.  No further chest pain.  The patient remains Stable in the ED.  Results discussed with him and his significant other.  Discussed that his chest pain is likely due to GI origin but cannot rule out ACS at this time.  Recommend observation admission which he is agreeable to.  Repeat EKG is unchanged and nonischemic. Observation admission discussed with Dr. Darrick Meigs. Final Clinical Impression(s) / ED Diagnoses Final diagnoses:  Chest pain, unspecified type  Esophagitis    Rx / DC  Orders ED Discharge Orders    None       Theoden Mauch, Annie Main, MD 04/01/20 [id number]

## 2020-04-01 ENCOUNTER — Encounter (HOSPITAL_COMMUNITY)
Admission: EM | Disposition: A | Discharge status: Home / Self Care | Payer: Self-pay | Source: Home / Self Care | Attending: Emergency Medicine

## 2020-04-01 ENCOUNTER — Other Ambulatory Visit: Payer: Self-pay

## 2020-04-01 ENCOUNTER — Emergency Department (HOSPITAL_COMMUNITY): Payer: BC Managed Care – PPO

## 2020-04-01 ENCOUNTER — Observation Stay (HOSPITAL_BASED_OUTPATIENT_CLINIC_OR_DEPARTMENT_OTHER): Payer: BC Managed Care – PPO

## 2020-04-01 ENCOUNTER — Encounter: Payer: Self-pay | Admitting: Internal Medicine

## 2020-04-01 ENCOUNTER — Encounter (HOSPITAL_COMMUNITY): Payer: Self-pay | Admitting: Family Medicine

## 2020-04-01 DIAGNOSIS — K219 Gastro-esophageal reflux disease without esophagitis: Secondary | ICD-10-CM

## 2020-04-01 DIAGNOSIS — I361 Nonrheumatic tricuspid (valve) insufficiency: Secondary | ICD-10-CM | POA: Diagnosis not present

## 2020-04-01 DIAGNOSIS — J8410 Pulmonary fibrosis, unspecified: Secondary | ICD-10-CM | POA: Diagnosis not present

## 2020-04-01 DIAGNOSIS — R0789 Other chest pain: Secondary | ICD-10-CM | POA: Diagnosis not present

## 2020-04-01 DIAGNOSIS — R079 Chest pain, unspecified: Secondary | ICD-10-CM | POA: Diagnosis present

## 2020-04-01 DIAGNOSIS — J984 Other disorders of lung: Secondary | ICD-10-CM | POA: Diagnosis not present

## 2020-04-01 DIAGNOSIS — B20 Human immunodeficiency virus [HIV] disease: Secondary | ICD-10-CM

## 2020-04-01 DIAGNOSIS — I351 Nonrheumatic aortic (valve) insufficiency: Secondary | ICD-10-CM

## 2020-04-01 DIAGNOSIS — K209 Esophagitis, unspecified without bleeding: Secondary | ICD-10-CM | POA: Diagnosis not present

## 2020-04-01 DIAGNOSIS — K228 Other specified diseases of esophagus: Secondary | ICD-10-CM | POA: Diagnosis not present

## 2020-04-01 DIAGNOSIS — K8020 Calculus of gallbladder without cholecystitis without obstruction: Secondary | ICD-10-CM | POA: Diagnosis not present

## 2020-04-01 HISTORY — PX: ESOPHAGOGASTRODUODENOSCOPY: SHX5428

## 2020-04-01 LAB — SARS CORONAVIRUS 2 BY RT PCR (HOSPITAL ORDER, PERFORMED IN ~~LOC~~ HOSPITAL LAB): SARS Coronavirus 2: NEGATIVE

## 2020-04-01 LAB — ECHOCARDIOGRAM COMPLETE
Height: 66 in
Weight: 3040 oz

## 2020-04-01 LAB — HEPATIC FUNCTION PANEL
ALT: 28 U/L (ref 0–44)
AST: 23 U/L (ref 15–41)
Albumin: 4.6 g/dL (ref 3.5–5.0)
Alkaline Phosphatase: 71 U/L (ref 38–126)
Bilirubin, Direct: <0.1 mg/dL (ref 0.0–0.2)
Total Bilirubin: 0.6 mg/dL (ref 0.3–1.2)
Total Protein: 7.7 g/dL (ref 6.5–8.1)

## 2020-04-01 LAB — TROPONIN I (HIGH SENSITIVITY)
Troponin I (High Sensitivity): 2 ng/L (ref <18)
Troponin I (High Sensitivity): 2 ng/L (ref <18)
Troponin I (High Sensitivity): <2 ng/L (ref <18)

## 2020-04-01 LAB — LIPASE, BLOOD: Lipase: 28 U/L (ref 11–51)

## 2020-04-01 SURGERY — EGD (ESOPHAGOGASTRODUODENOSCOPY)
Anesthesia: Moderate Sedation

## 2020-04-01 MED ORDER — MEPERIDINE HCL 50 MG/ML IJ SOLN
INTRAMUSCULAR | Status: AC
  Filled 2020-04-01: qty 1

## 2020-04-01 MED ORDER — VALACYCLOVIR HCL 500 MG PO TABS
500.0000 mg | ORAL_TABLET | Freq: Two times a day (BID) | ORAL | Status: DC | PRN
  Filled 2020-04-01: qty 1

## 2020-04-01 MED ORDER — STERILE WATER FOR IRRIGATION IR SOLN
Status: DC | PRN
  Administered 2020-04-01: 1.5 mL

## 2020-04-01 MED ORDER — SUCRALFATE 1 GM/10ML PO SUSP: 1.0000 g | Freq: Three times a day (TID) | ORAL | Status: DC

## 2020-04-01 MED ORDER — ACETAMINOPHEN 325 MG PO TABS: 650.0000 mg | ORAL_TABLET | ORAL | Status: DC | PRN

## 2020-04-01 MED ORDER — PANTOPRAZOLE SODIUM 40 MG PO TBEC: 40.0000 mg | DELAYED_RELEASE_TABLET | Freq: Every day | ORAL | Status: DC

## 2020-04-01 MED ORDER — PANTOPRAZOLE SODIUM 40 MG IV SOLR
40.0000 mg | Freq: Once | INTRAVENOUS | Status: AC
  Administered 2020-04-01: 40 mg via INTRAVENOUS
  Filled 2020-04-01: qty 40

## 2020-04-01 MED ORDER — MEPERIDINE HCL 100 MG/ML IJ SOLN
INTRAMUSCULAR | Status: DC | PRN
  Administered 2020-04-01: 15 mg via INTRAVENOUS
  Administered 2020-04-01: 10 mg via INTRAVENOUS
  Administered 2020-04-01: 25 mg via INTRAVENOUS

## 2020-04-01 MED ORDER — PANTOPRAZOLE SODIUM 40 MG IV SOLR
40.0000 mg | Freq: Two times a day (BID) | INTRAVENOUS | Status: DC
  Administered 2020-04-01: 40 mg via INTRAVENOUS
  Filled 2020-04-01: qty 40

## 2020-04-01 MED ORDER — SODIUM CHLORIDE 0.9 % IV SOLN: INTRAVENOUS | Status: DC

## 2020-04-01 MED ORDER — MIDAZOLAM HCL 5 MG/5ML IJ SOLN
INTRAMUSCULAR | Status: AC
  Filled 2020-04-01: qty 10

## 2020-04-01 MED ORDER — ONDANSETRON HCL 4 MG/2ML IJ SOLN
INTRAMUSCULAR | Status: DC | PRN
  Administered 2020-04-01: 4 mg via INTRAVENOUS

## 2020-04-01 MED ORDER — ONDANSETRON HCL 4 MG/2ML IJ SOLN
INTRAMUSCULAR | Status: AC
  Filled 2020-04-01: qty 2

## 2020-04-01 MED ORDER — LIDOCAINE VISCOUS HCL 2 % MT SOLN
OROMUCOSAL | Status: AC
  Filled 2020-04-01: qty 15

## 2020-04-01 MED ORDER — VALACYCLOVIR HCL 500 MG PO TABS: 500.0000 mg | ORAL_TABLET | Freq: Two times a day (BID) | ORAL | Status: DC

## 2020-04-01 MED ORDER — ONDANSETRON HCL 4 MG/2ML IJ SOLN: 4.0000 mg | Freq: Four times a day (QID) | INTRAMUSCULAR | Status: DC | PRN

## 2020-04-01 MED ORDER — LIDOCAINE VISCOUS HCL 2 % MT SOLN
OROMUCOSAL | Status: DC | PRN
  Administered 2020-04-01: 4 mL via OROMUCOSAL

## 2020-04-01 MED ORDER — MIDAZOLAM HCL 5 MG/5ML IJ SOLN
INTRAMUSCULAR | Status: DC | PRN
  Administered 2020-04-01 (×2): 2 mg via INTRAVENOUS
  Administered 2020-04-01: 1 mg via INTRAVENOUS

## 2020-04-01 MED ORDER — ENOXAPARIN SODIUM 40 MG/0.4ML ~~LOC~~ SOLN
40.0000 mg | SUBCUTANEOUS | Status: DC
  Administered 2020-04-01: 40 mg via SUBCUTANEOUS
  Filled 2020-04-01: qty 0.4

## 2020-04-01 MED ORDER — IOHEXOL 300 MG/ML  SOLN
100.0000 mL | Freq: Once | INTRAMUSCULAR | Status: AC | PRN
  Administered 2020-04-01: 100 mL via INTRAVENOUS

## 2020-04-01 MED ORDER — ELVITEG-COBIC-EMTRICIT-TENOFAF 150-150-200-10 MG PO TABS: 1.0000 | ORAL_TABLET | Freq: Every day | ORAL | Status: DC

## 2020-04-01 MED ORDER — ASPIRIN EC 81 MG PO TBEC: 81.0000 mg | DELAYED_RELEASE_TABLET | Freq: Every day | ORAL | Status: DC

## 2020-04-01 NOTE — ED Notes (Signed)
ED TO INPATIENT HANDOFF REPORT  ED Nurse Name and Phone #: [phone]  S Name/Age/Gender Luis Gomez 64 y.o. male Room/Bed: APA12/APA12  Code Status   Code Status: Full Code  Home/SNF/Other Home Patient oriented to: self Is this baseline? No   Triage Complete: Triage complete  Chief Complaint Chest pain [R07.9]  Triage Note Pt C/O reflux X 3 days. Pt states whenever he tries to eat anything it comes back up. Pt also reports mild chest pain.     Allergies No Known Allergies  Level of Care/Admitting Diagnosis ED Disposition    ED Disposition Condition [facility] Area: [facility] [id number]  Level of Care: Telemetry [5]  Covid Evaluation: Asymptomatic Screening Protocol (No Symptoms)  Diagnosis: Chest pain [id number]  Admitting Physician: Oswald Hillock [place]  Attending Physician: Loree Fee       B Medical/Surgery History Past Medical History:  Diagnosis Date  . Diverticulosis   . Genital HSV   . GERD (gastroesophageal reflux disease)    Last EGD->07/02/06 normal  . HIV disease ([place])   . S/P colonoscopy 07/02/2006   Left sided diverticula   Past Surgical History:  Procedure Laterality Date  . BACK SURGERY    . COLONOSCOPY  07/03/2006   Normal rectum, left-sided diverticulum  . COLONOSCOPY N/A 02/07/2018   Procedure: COLONOSCOPY;  Surgeon: Daneil Dolin, MD;  [facility];  Service: Endoscopy;  Laterality: N/A;  7:30 - pt knows to arrive at 7:00  . ESOPHAGOGASTRODUODENOSCOPY  07/03/2006   Normal esophagus, stomach, D1, D2.  Marland Kitchen POLYPECTOMY  02/07/2018   Procedure: POLYPECTOMY;  Surgeon: Daneil Dolin, MD;  Location: [facility];  Service: Endoscopy;;  colon      A IV Location/Drains/Wounds Patient Lines/Drains/Airways Status   Active Line/Drains/Airways    Name:   Placement date:   Placement time:   Site:   Days:   Peripheral IV 03/31/20 Left Forearm   03/31/20    2241    Forearm   1           Intake/Output Last 24 hours No intake or output data in the 24 hours ending 04/01/20 1237  Labs/Imaging Results for orders placed or performed during the hospital encounter of 03/31/20 (from the past 48 hour(s))  Basic metabolic panel     Status: Abnormal   Collection Time: 03/31/20 10:25 PM  Result Value Ref Range   Sodium 135 135 - 145 mmol/L   Potassium 3.6 3.5 - 5.1 mmol/L   Chloride 97 (L) 98 - 111 mmol/L   CO2 25 22 - 32 mmol/L   Glucose, Bld 126 (H) 70 - 99 mg/dL    Comment: Glucose reference range applies only to samples taken after fasting for at least 8 hours.   BUN 13 8 - 23 mg/dL   Creatinine, Ser 0.98 0.61 - 1.24 mg/dL   Calcium 9.2 8.9 - 10.3 mg/dL   GFR calc non Af Amer >60 >60 mL/min   GFR calc Af Amer >60 >60 mL/min   Anion gap 13 5 - 15    Comment: Performed at [facility], [address], [place], [place] [address]  CBC     Status: None   Collection Time: 03/31/20 10:25 PM  Result Value Ref Range   WBC 8.6 4.0 - 10.5 K/uL   RBC 5.12 4.22 - 5.81 MIL/uL   Hemoglobin 14.4 13.0 - 17.0 g/dL   HCT 44.2 39.0 - 52.0 %  MCV 86.3 80.0 - 100.0 fL   MCH 28.1 26.0 - 34.0 pg   MCHC 32.6 30.0 - 36.0 g/dL   RDW 12.6 11.5 - 15.5 %   Platelets 317 150 - 400 K/uL   nRBC 0.0 0.0 - 0.2 %    Comment: Performed at [facility], [address], [place], [place] [address]  Troponin I (High Sensitivity)     Status: None   Collection Time: 03/31/20 10:25 PM  Result Value Ref Range   Troponin I (High Sensitivity) 2 <18 ng/L    Comment: (NOTE) Elevated high sensitivity troponin I (hsTnI) values and significant  changes across serial measurements may suggest ACS but many other  chronic and acute conditions are known to elevate hsTnI results.  Refer to the "Links" section for chest pain algorithms and additional  guidance. Performed at [facility], [address], [place], [place] [address]   Hepatic function panel     Status: None   Collection Time: 03/31/20 10:25 PM   Result Value Ref Range   Total Protein 7.7 6.5 - 8.1 g/dL   Albumin 4.6 3.5 - 5.0 g/dL   AST 23 15 - 41 U/L   ALT 28 0 - 44 U/L   Alkaline Phosphatase 71 38 - 126 U/L   Total Bilirubin 0.6 0.3 - 1.2 mg/dL   Bilirubin, Direct <0.1 0.0 - 0.2 mg/dL   Indirect Bilirubin NOT CALCULATED 0.3 - 0.9 mg/dL    Comment: Performed at [facility], [address] [place], [place] [address]  Lipase, blood     Status: None   Collection Time: 03/31/20 10:25 PM  Result Value Ref Range   Lipase 28 11 - 51 U/L    Comment: Performed at [facility], [address], [place], [place] [address]  Troponin I (High Sensitivity)     Status: None   Collection Time: 04/01/20 12:55 AM  Result Value Ref Range   Troponin I (High Sensitivity) 2 <18 ng/L    Comment: (NOTE) Elevated high sensitivity troponin I (hsTnI) values and significant  changes across serial measurements may suggest ACS but many other  chronic and acute conditions are known to elevate hsTnI results.  Refer to the "Links" section for chest pain algorithms and additional  guidance. Performed at [facility], [address], [place], [place] [address]   SARS Coronavirus 2 by RT PCR (hospital order, performed in [facility] hospital lab) Nasopharyngeal Nasopharyngeal Swab     Status: None   Collection Time: 04/01/20  1:42 AM   Specimen: Nasopharyngeal Swab  Result Value Ref Range   SARS Coronavirus 2 NEGATIVE NEGATIVE    Comment: (NOTE) SARS-CoV-2 target nucleic acids are NOT DETECTED. The SARS-CoV-2 RNA is generally detectable in upper and lower respiratory specimens during the acute phase of infection. The lowest concentration of SARS-CoV-2 viral copies this assay can detect is 250 copies / mL. A negative result does not preclude SARS-CoV-2 infection and should not be used as the sole basis for treatment or other patient management decisions.  A negative result may occur with improper specimen collection / handling, submission of specimen  other than nasopharyngeal swab, presence of viral mutation(s) within the areas targeted by this assay, and inadequate number of viral copies (<250 copies / mL). A negative result must be combined with clinical observations, patient history, and epidemiological information. Fact Sheet for Patients:   StrictlyIdeas.no Fact Sheet for Healthcare Providers: BankingDealers.co.za This test is not yet approved or cleared  by the [place]  States FDA and has been authorized for detection and/or diagnosis of SARS-CoV-2 by FDA under an Emergency Use Authorization (EUA).  This EUA will remain in effect (meaning this test can be used) for the duration of the COVID-19 declaration under Section 564(b)(1) of the Act, 21 U.S.C. section 360bbb-3(b)(1), unless the authorization is terminated or revoked sooner. Performed at [facility], [address], [place], [place] [address]   Troponin I (High Sensitivity)     Status: None   Collection Time: 04/01/20  5:46 AM  Result Value Ref Range   Troponin I (High Sensitivity) 2 <18 ng/L    Comment: (NOTE) Elevated high sensitivity troponin I (hsTnI) values and significant  changes across serial measurements may suggest ACS but many other  chronic and acute conditions are known to elevate hsTnI results.  Refer to the "Links" section for chest pain algorithms and additional  guidance. Performed at [facility], [address], [place], [place] [address]   Troponin I (High Sensitivity)     Status: None   Collection Time: 04/01/20  8:04 AM  Result Value Ref Range   Troponin I (High Sensitivity) <2 <18 ng/L    Comment: (NOTE) Elevated high sensitivity troponin I (hsTnI) values and significant  changes across serial measurements may suggest ACS but many other  chronic and acute conditions are known to elevate hsTnI results.  Refer to the "Links" section for chest pain algorithms and additional  guidance. Performed at  [facility], [address] [place], [place] [address]    DG Chest 2 View  Result Date: 03/31/2020 CLINICAL DATA:  Chest pain EXAM: CHEST - 2 VIEW COMPARISON:  10/29/2015 FINDINGS: The heart size and mediastinal contours are within normal limits. Both lungs are clear. Small calcified lung nodule at the right base. The visualized skeletal structures are unremarkable. IMPRESSION: No active cardiopulmonary disease. Electronically Signed   By: Donavan Foil M.D.   On: 03/31/2020 22:25   CT Chest W Contrast  Result Date: 04/01/2020 CLINICAL DATA:  Epigastric and mid chest pain EXAM: CT CHEST, ABDOMEN, AND PELVIS WITH CONTRAST TECHNIQUE: Multidetector CT imaging of the chest, abdomen and pelvis was performed following the standard protocol during bolus administration of intravenous contrast. CONTRAST:  153mL OMNIPAQUE IOHEXOL 300 MG/ML  SOLN COMPARISON:  Chest radiograph 03/31/2020, CT 06/05/2011 FINDINGS: CT CHEST FINDINGS Cardiovascular: Normal heart size. No pericardial effusion. Calcifications seen left coronary artery. The aorta is normal caliber. Normal 3 vessel branching of the aortic arch. Proximal great vessels are unremarkable. Central pulmonary arteries are normal caliber. No large central or lobar filling defects on this non tailored examination pulmonary arteries. Mediastinum/Nodes: Normal thyroid gland and thoracic inlet. No acute abnormality of the trachea. Mild distal esophageal thickening without paraesophageal, fluid or gas. Few calcified mediastinal and hilar nodes are present, likely sequela of remote granulomatous disease. No worrisome mediastinal, hilar or axillary adenopathy. Lungs/Pleura: Few calcified granulomata in the lungs (3/100). No suspicious pulmonary nodules or masses. No consolidation, features of edema, pneumothorax, or effusion. Musculoskeletal: Benign-appearing sclerotic focus in the proximal right humerus, partially collimated from cross-sectional imaging. No acute osseous  abnormality or suspicious osseous lesion. No suspicious chest wall abnormalities. CT ABDOMEN PELVIS FINDINGS Hepatobiliary: No worrisome focal liver lesions. Smooth liver surface contour. Normal hepatic attenuation. Calcified gallstone seen within the proximal body of the gallbladder. No pericholecystic fluid or inflammation. No intraductal gallstones or biliary dilatation. Pancreas: Unremarkable. No pancreatic ductal dilatation or surrounding inflammatory changes. Spleen: Normal in size without focal abnormality. Adrenals/Urinary Tract:  Normal adrenal glands. Kidneys are normally located with symmetric enhancement and excretion. Small focus of scarring in the posterior left upper pole. No suspicious renal lesion, urolithiasis or hydronephrosis. Urinary bladder is unremarkable aside from mild indentation of the bladder base by the prostate. Stomach/Bowel: Distal esophageal thickening, as above. Stomach and duodenum are unremarkable. No small bowel thickening or dilatation. A normal appendix is visualized. No proximal colonic dilatation or wall thickening. There are scattered distal colonic diverticula with more long segmental thickening of the sigmoid colon but without associated inflammation at this time. Vascular/Lymphatic: Minimal plaque in the distal aorta and iliac arteries. No other significant vascular findings. No suspicious or enlarged lymph nodes in the included lymphatic chains. Reproductive: Prostate at the upper limits of normal for size. Coarse eccentric calcification of the prostate. No concerning abnormalities of the prostate or seminal vesicles. Other: No abdominopelvic free fluid or free gas. No bowel containing hernias. Small fat containing left inguinal hernia. Musculoskeletal: No acute osseous abnormality or suspicious osseous lesion. Stable bone island in the left pubic body. Multilevel degenerative changes are present in the imaged portions of the spine. IMPRESSION: 1. Mild distal esophageal  thickening without paraesophageal, fluid or gas. Findings may represent esophagitis, correlate with symptoms and consider direct visualization. 2. Scattered distal colonic diverticula with segmental thickening of the sigmoid without active inflammation at this time, may reflect sequela of prior diverticular inflammation though should consider correlation with prior colonoscopy if recently performed or direct visualization on an outpatient basis. 3. No other acute findings in the chest, abdomen or pelvis. 4. Cholelithiasis without CT evidence of acute cholecystitis. 5. Aortic Atherosclerosis (ICD10-I70.0). 6. Coronary artery calcifications are present. Please note that the presence of coronary artery calcium documents the presence of coronary artery disease, the severity of this disease and any potential stenosis cannot be assessed on this non-gated CT examination. Assessment for potential risk factor modification, dietary therapy or pharmacologic therapy may be warranted. Electronically Signed   By: Lovena Le M.D.   On: 04/01/2020 01:13   CT ABDOMEN PELVIS W CONTRAST  Result Date: 04/01/2020 CLINICAL DATA:  Epigastric and mid chest pain EXAM: CT CHEST, ABDOMEN, AND PELVIS WITH CONTRAST TECHNIQUE: Multidetector CT imaging of the chest, abdomen and pelvis was performed following the standard protocol during bolus administration of intravenous contrast. CONTRAST:  190mL OMNIPAQUE IOHEXOL 300 MG/ML  SOLN COMPARISON:  Chest radiograph 03/31/2020, CT 06/05/2011 FINDINGS: CT CHEST FINDINGS Cardiovascular: Normal heart size. No pericardial effusion. Calcifications seen left coronary artery. The aorta is normal caliber. Normal 3 vessel branching of the aortic arch. Proximal great vessels are unremarkable. Central pulmonary arteries are normal caliber. No large central or lobar filling defects on this non tailored examination pulmonary arteries. Mediastinum/Nodes: Normal thyroid gland and thoracic inlet. No acute  abnormality of the trachea. Mild distal esophageal thickening without paraesophageal, fluid or gas. Few calcified mediastinal and hilar nodes are present, likely sequela of remote granulomatous disease. No worrisome mediastinal, hilar or axillary adenopathy. Lungs/Pleura: Few calcified granulomata in the lungs (3/100). No suspicious pulmonary nodules or masses. No consolidation, features of edema, pneumothorax, or effusion. Musculoskeletal: Benign-appearing sclerotic focus in the proximal right humerus, partially collimated from cross-sectional imaging. No acute osseous abnormality or suspicious osseous lesion. No suspicious chest wall abnormalities. CT ABDOMEN PELVIS FINDINGS Hepatobiliary: No worrisome focal liver lesions. Smooth liver surface contour. Normal hepatic attenuation. Calcified gallstone seen within the proximal body of the gallbladder. No pericholecystic fluid or inflammation. No intraductal gallstones or biliary dilatation. Pancreas: Unremarkable. No  pancreatic ductal dilatation or surrounding inflammatory changes. Spleen: Normal in size without focal abnormality. Adrenals/Urinary Tract: Normal adrenal glands. Kidneys are normally located with symmetric enhancement and excretion. Small focus of scarring in the posterior left upper pole. No suspicious renal lesion, urolithiasis or hydronephrosis. Urinary bladder is unremarkable aside from mild indentation of the bladder base by the prostate. Stomach/Bowel: Distal esophageal thickening, as above. Stomach and duodenum are unremarkable. No small bowel thickening or dilatation. A normal appendix is visualized. No proximal colonic dilatation or wall thickening. There are scattered distal colonic diverticula with more long segmental thickening of the sigmoid colon but without associated inflammation at this time. Vascular/Lymphatic: Minimal plaque in the distal aorta and iliac arteries. No other significant vascular findings. No suspicious or enlarged lymph  nodes in the included lymphatic chains. Reproductive: Prostate at the upper limits of normal for size. Coarse eccentric calcification of the prostate. No concerning abnormalities of the prostate or seminal vesicles. Other: No abdominopelvic free fluid or free gas. No bowel containing hernias. Small fat containing left inguinal hernia. Musculoskeletal: No acute osseous abnormality or suspicious osseous lesion. Stable bone island in the left pubic body. Multilevel degenerative changes are present in the imaged portions of the spine. IMPRESSION: 1. Mild distal esophageal thickening without paraesophageal, fluid or gas. Findings may represent esophagitis, correlate with symptoms and consider direct visualization. 2. Scattered distal colonic diverticula with segmental thickening of the sigmoid without active inflammation at this time, may reflect sequela of prior diverticular inflammation though should consider correlation with prior colonoscopy if recently performed or direct visualization on an outpatient basis. 3. No other acute findings in the chest, abdomen or pelvis. 4. Cholelithiasis without CT evidence of acute cholecystitis. 5. Aortic Atherosclerosis (ICD10-I70.0). 6. Coronary artery calcifications are present. Please note that the presence of coronary artery calcium documents the presence of coronary artery disease, the severity of this disease and any potential stenosis cannot be assessed on this non-gated CT examination. Assessment for potential risk factor modification, dietary therapy or pharmacologic therapy may be warranted. Electronically Signed   By: Lovena Le M.D.   On: 04/01/2020 01:13    Pending Labs Unresulted Labs (From admission, onward)    Start     Ordered   04/08/20 0500  Creatinine, serum  (enoxaparin (LOVENOX)    CrCl >/= 30 ml/min)  Weekly,   R    Comments: while on enoxaparin therapy    04/01/20 0539          Vitals/Pain Today's Vitals   04/01/20 1030 04/01/20 1100  04/01/20 1130 04/01/20 1200  BP: 130/83 128/79 (!) 128/96 122/78  Pulse: 72 65 80 71  Resp: (!) 23 19 16 20   Temp:      TempSrc:      SpO2: 98% 97% 98% 96%  Weight:      Height:      PainSc:        Isolation Precautions No active isolations  Medications Medications  sodium chloride flush (NS) 0.9 % injection 3 mL (3 mLs Intravenous Not Given 03/31/20 2241)  elvitegravir-cobicistat-emtricitabine-tenofovir ([name]) 150-150-200-10 MG tablet 1 tablet (1 tablet Oral Not Given 04/01/20 [id number])  aspirin EC tablet 81 mg (81 mg Oral Not Given 04/01/20 0954)  valACYclovir (VALTREX) tablet 500 mg (500 mg Oral Not Given 04/01/20 0955)  acetaminophen (TYLENOL) tablet 650 mg (has no administration in time range)  ondansetron (ZOFRAN) injection 4 mg (has no administration in time range)  enoxaparin (LOVENOX) injection 40 mg (40 mg Subcutaneous Given 04/01/20  1053)  0.9 %  sodium chloride infusion ( Intravenous New Bag/Given 04/01/20 0616)  pantoprazole (PROTONIX) injection 40 mg (40 mg Intravenous Given 04/01/20 1053)  sucralfate (CARAFATE) 1 GM/10ML suspension 1 g (1 g Oral Not Given 04/01/20 1155)  aspirin chewable tablet 324 mg (324 mg Oral Given 04/01/20 0008)  alum & mag hydroxide-simeth (MAALOX/MYLANTA) 200-200-20 MG/5ML suspension 30 mL (30 mLs Oral Given 04/01/20 0008)    And  lidocaine (XYLOCAINE) 2 % viscous mouth solution 15 mL (15 mLs Oral Given 04/01/20 0008)  iohexol (OMNIPAQUE) 300 MG/ML solution 100 mL (100 mLs Intravenous Contrast Given 04/01/20 0042)  pantoprazole (PROTONIX) injection 40 mg (40 mg Intravenous Given 04/01/20 0148)    Mobility  Low fall risk   Focused Assessments    R Recommendations: See Admitting Provider Note  Report given to:   Additional Notes:

## 2020-04-01 NOTE — Op Note (Signed)
[facility] Patient Name: Luis Gomez [facility] Procedure Date: 04/01/2020 1:08 PM MRN: [id number] Date of Birth: 11-09-1955 Attending Gomez: Luis Gomez , Gomez CSN: [id number] Age: 64 Admit Type: Outpatient Procedure:                Upper GI endoscopy Indications:              Suspected reflux esophagitis, , Abnormal CT of the                            GI tract; Providers:                Luis Richards, Gomez, Luis Peak B. Gwenlyn Perking RN, Gomez,                            Luis Gomez:              Medicines:                Midazolam 5 mg IV, Meperidine 50 mg IV, Ondansetron                            4 mg IV Complications:            No immediate complications. Estimated Blood Loss:     Estimated blood loss: none. Procedure:                Pre-Anesthesia Assessment:                           - Prior to the procedure, a History and Physical                            was performed, and patient medications and                            allergies were reviewed. The patient's tolerance of                            previous anesthesia was also reviewed. The risks                            and benefits of the procedure and the sedation                            options and risks were discussed with the patient.                            All questions were answered, and informed consent                            was obtained. Prior Anticoagulants: The patient has                            taken no previous anticoagulant or antiplatelet  agents. ASA Grade Assessment: III - A patient with                            severe systemic disease. After reviewing the risks                            and benefits, the patient was deemed in                            satisfactory condition to undergo the procedure.                           After obtaining informed consent, the endoscope was                            passed under direct vision.  Throughout the                            procedure, the patient's blood pressure, pulse, and                            oxygen saturations were monitored continuously. The                            [id number]) was introduced through the                            mouth, and advanced to the second part of duodenum.                            The upper GI endoscopy was accomplished without                            difficulty. The patient tolerated the procedure                            well. Scope In: 1:30:57 PM Scope Out: 1:34:06 PM Total Procedure Duration: 0 hours 3 minutes 9 seconds  Findings:      Severe erosive/ulcerative reflux esophagitis with circumferential distal       esophageal erosions with four-quadrant linear erosions/ulcerations       extending up 8 cm into the distal esophagus. No tumor. No Barrett's       epithelium seen, no plaques seen. Noncritical Schatzki's ring present.      A medium-sized hiatal hernia was present.      The exam was otherwise without abnormality.      The duodenal bulb and second portion of the duodenum were normal. Impression:               -Severe erosive/ulcerative reflux esophagitis.                            Noncritical Schatzki's ring. Medium size hiatal  hernia.                           -No specimen Moderate Sedation:      Moderate (conscious) sedation was administered by the endoscopy nurse       and supervised by the endoscopist. The following parameters were       monitored: oxygen saturation, heart rate, blood pressure, respiratory       rate, EKG, adequacy of pulmonary ventilation, and response to care.       Total physician intraservice time was 9 minutes. Recommendation:           - Patient has a contact number available for                            emergencies. The signs and symptoms of potential                            delayed complications were discussed with the                             patient. Return to normal activities tomorrow.                            Written discharge instructions were provided to the                            patient.                           - Advance diet as tolerated. Begin Protonix 40 mg                            twice daily. Carafate suspension 1 g before lunch,                            supper and at bedtime x 5 days?"not to be taken                            around breakfast when he is taking [name]                           (Reviewed medications with Luis Gomez in the [place] to assess for potential drug                            interactions?"were okay with this new regimen).                            Office visit with [name] in 6 weeks Procedure Code(s):        --- Professional ---                           [phone], Esophagogastroduodenoscopy,  flexible,                            transoral; diagnostic, including collection of                            specimen(s) by brushing or washing, when performed                            (separate procedure) Diagnosis Code(s):        --- Professional ---                           K20.90, Esophagitis, unspecified without bleeding                           K44.9, Diaphragmatic hernia without obstruction or                            gangrene                           R93.3, Abnormal findings on diagnostic imaging of                            other parts of digestive tract CPT copyright 2019 [employer]. All rights reserved. The codes documented in this report are preliminary and upon coder review may  be revised to meet current compliance requirements. Luis Gomez. Luis Pinnix, Gomez Luis Richards, Gomez 04/01/2020 1:59:05 PM This report has been signed electronically. Number of Addenda: 0

## 2020-04-01 NOTE — H&P (Signed)
TRH H&P    Patient Demographics:    Kelechi Hoehn, is a 64 y.o. male  MRN: [id number]  DOB - 08/19/1956  Admit Date - 03/31/2020  Referring MD/NP/PA: Dr Wyvonnia Dusky  Outpatient Primary MD for the patient is Sharilyn Sites, MD  Patient coming from:   Chief complaint-chest pain   HPI:    Vu Yzaguirre  is a 64 y.o. hispanic male, with history of GERD, hypertension, hyperlipidemia, diabetes mellitus type 2, HIV came to ED with complaints of chest pain with nausea vomiting.  Patient had 5-6 episodes of vomiting.  Denies any black or bloody stools.  He also had brief episode of chest pain which came on after eating.  No previous cardiac history.  Denies any abdominal pain. CT scan showed evidence of esophagitis as well as CAD.  Initial troponin was negative. Patient's chest pain has improved after he received GI cocktail in the ED.    Review of systems:    In addition to the HPI above,    All other systems reviewed and are negative.    Past History of the following :    Past Medical History:  Diagnosis Date  . Diverticulosis   . Genital HSV   . GERD (gastroesophageal reflux disease)    Last EGD->07/02/06 normal  . HIV disease ([facility])   . S/P colonoscopy 07/02/2006   Left sided diverticula      Past Surgical History:  Procedure Laterality Date  . BACK SURGERY    . COLONOSCOPY  07/03/2006   Normal rectum, left-sided diverticulum  . COLONOSCOPY N/A 02/07/2018   Procedure: COLONOSCOPY;  Surgeon: Daneil Dolin, MD;  [facility];  Service: Endoscopy;  Laterality: N/A;  7:30 - pt knows to arrive at 7:00  . ESOPHAGOGASTRODUODENOSCOPY  07/03/2006   Normal esophagus, stomach, D1, D2.  Marland Kitchen POLYPECTOMY  02/07/2018   Procedure: POLYPECTOMY;  Surgeon: Daneil Dolin, MD;  Location: [facility];  Service: Endoscopy;;  colon       Social History:      Social History   Tobacco  Use  . Smoking status: Never Smoker  . Smokeless tobacco: Never Used  Substance Use Topics  . Alcohol use: No       Family History :   No family history of heart disease   Home Medications:   Prior to Admission medications   Medication Sig Start Date End Date Taking? Authorizing Provider  aspirin EC 81 MG tablet Take 1 tablet (81 mg total) by mouth daily. 05/24/15   Campbell Riches, MD  elvitegravir-cobicistat-emtricitabine-tenofovir ([name]) 150-150-200-10 MG TABS tablet Take 1 tablet by mouth daily with breakfast. 01/19/20   Campbell Riches, MD  Omega-3 Fatty Acids (FISH OIL) 1000 MG CAPS Take 1,000 mg by mouth daily.     [provider]  sildenafil (REVATIO) 20 MG tablet Take 10 mg by mouth daily as needed (for ED).     [provider]  valACYclovir (VALTREX) 500 MG tablet Take 1 tablet (500 mg total) by mouth 2 (two)  times daily. 01/21/19   Campbell Riches, MD     Allergies:    No Known Allergies   Physical Exam:   Vitals  Blood pressure 131/79, pulse 72, temperature 98.7 F (37.1 C), temperature source Oral, resp. rate 18, height 5\' 6"  (1.676 m), weight 86.2 kg, SpO2 99 %.  1.  General: Appears in no acute distress  2. Psychiatric: Alert, oriented x3, intact insight and judgment  3. Neurologic: Cranial nerves II through XII grossly intact, no focal deficit noted  4. HEENMT:  Atraumatic normocephalic, extraocular muscles are intact  5. Respiratory : Clear to auscultation bilaterally, no wheezing or crackles auscultated  6. Cardiovascular : S1-S2, regular, no murmur auscultated  7. Gastrointestinal:  Abdomen is soft, nontender, no organomegaly      Data Review:    CBC Recent Labs  Lab 03/31/20 2225  WBC 8.6  HGB 14.4  HCT 44.2  PLT 317  MCV 86.3  MCH 28.1  MCHC 32.6  RDW 12.6   ------------------------------------------------------------------------------------------------------------------  Results for orders  placed or performed during the hospital encounter of 03/31/20 (from the past 48 hour(s))  Basic metabolic panel     Status: Abnormal   Collection Time: 03/31/20 10:25 PM  Result Value Ref Range   Sodium 135 135 - 145 mmol/L   Potassium 3.6 3.5 - 5.1 mmol/L   Chloride 97 (L) 98 - 111 mmol/L   CO2 25 22 - 32 mmol/L   Glucose, Bld 126 (H) 70 - 99 mg/dL    Comment: Glucose reference range applies only to samples taken after fasting for at least 8 hours.   BUN 13 8 - 23 mg/dL   Creatinine, Ser 0.98 0.61 - 1.24 mg/dL   Calcium 9.2 8.9 - 10.3 mg/dL   GFR calc non Af Amer >60 >60 mL/min   GFR calc Af Amer >60 >60 mL/min   Anion gap 13 5 - 15    Comment: Performed at [facility], [address] [place], [place] [address]  CBC     Status: None   Collection Time: 03/31/20 10:25 PM  Result Value Ref Range   WBC 8.6 4.0 - 10.5 K/uL   RBC 5.12 4.22 - 5.81 MIL/uL   Hemoglobin 14.4 13.0 - 17.0 g/dL   HCT 44.2 39.0 - 52.0 %   MCV 86.3 80.0 - 100.0 fL   MCH 28.1 26.0 - 34.0 pg   MCHC 32.6 30.0 - 36.0 g/dL   RDW 12.6 11.5 - 15.5 %   Platelets 317 150 - 400 K/uL   nRBC 0.0 0.0 - 0.2 %    Comment: Performed at [facility], [address] [place], [place] [address]  Troponin I (High Sensitivity)     Status: None   Collection Time: 03/31/20 10:25 PM  Result Value Ref Range   Troponin I (High Sensitivity) 2 <18 ng/L    Comment: (NOTE) Elevated high sensitivity troponin I (hsTnI) values and significant  changes across serial measurements may suggest ACS but many other  chronic and acute conditions are known to elevate hsTnI results.  Refer to the "Links" section for chest pain algorithms and additional  guidance. Performed at [facility], [address], [place], [place] [address]   Hepatic function panel     Status: None   Collection Time: 03/31/20 10:25 PM  Result Value Ref Range   Total Protein 7.7 6.5 - 8.1 g/dL   Albumin 4.6 3.5 - 5.0 g/dL   AST 23 15 - 41 U/L  ALT 28 0 - 44 U/L    Alkaline Phosphatase 71 38 - 126 U/L   Total Bilirubin 0.6 0.3 - 1.2 mg/dL   Bilirubin, Direct <0.1 0.0 - 0.2 mg/dL   Indirect Bilirubin NOT CALCULATED 0.3 - 0.9 mg/dL    Comment: Performed at [facility], [address], [place], [place] [address]  Lipase, blood     Status: None   Collection Time: 03/31/20 10:25 PM  Result Value Ref Range   Lipase 28 11 - 51 U/L    Comment: Performed at [facility], [address] [place], [place] [address]  Troponin I (High Sensitivity)     Status: None   Collection Time: 04/01/20 12:55 AM  Result Value Ref Range   Troponin I (High Sensitivity) 2 <18 ng/L    Comment: (NOTE) Elevated high sensitivity troponin I (hsTnI) values and significant  changes across serial measurements may suggest ACS but many other  chronic and acute conditions are known to elevate hsTnI results.  Refer to the "Links" section for chest pain algorithms and additional  guidance. Performed at [facility], [address] [place], [place] [address]   SARS Coronavirus 2 by RT PCR (hospital order, performed in [facility] hospital lab) Nasopharyngeal Nasopharyngeal Swab     Status: None   Collection Time: 04/01/20  1:42 AM   Specimen: Nasopharyngeal Swab  Result Value Ref Range   SARS Coronavirus 2 NEGATIVE NEGATIVE    Comment: (NOTE) SARS-CoV-2 target nucleic acids are NOT DETECTED. The SARS-CoV-2 RNA is generally detectable in upper and lower respiratory specimens during the acute phase of infection. The lowest concentration of SARS-CoV-2 viral copies this assay can detect is 250 copies / mL. A negative result does not preclude SARS-CoV-2 infection and should not be used as the sole basis for treatment or other patient management decisions.  A negative result may occur with improper specimen collection / handling, submission of specimen other than nasopharyngeal swab, presence of viral mutation(s) within the areas targeted by this assay, and inadequate number of viral  copies (<250 copies / mL). A negative result must be combined with clinical observations, patient history, and epidemiological information. Fact Sheet for Patients:   StrictlyIdeas.no Fact Sheet for Healthcare Providers: BankingDealers.co.za This test is not yet approved or cleared  by the [place] FDA and has been authorized for detection and/or diagnosis of SARS-CoV-2 by FDA under an Emergency Use Authorization (EUA).  This EUA will remain in effect (meaning this test can be used) for the duration of the COVID-19 declaration under Section 564(b)(1) of the Act, 21 U.S.C. section 360bbb-3(b)(1), unless the authorization is terminated or revoked sooner. Performed at [facility], [address], [place], [place] [address]     Chemistries  Recent Labs  Lab 03/31/20 2225  NA 135  K 3.6  CL 97*  CO2 25  GLUCOSE 126*  BUN 13  CREATININE 0.98  CALCIUM 9.2  AST 23  ALT 28  ALKPHOS 71  BILITOT 0.6   ------------------------------------------------------------------------------------------------------------------  ------------------------------------------------------------------------------------------------------------------ GFR: Estimated Creatinine Clearance: 79.4 mL/min (by C-G formula based on SCr of 0.98 mg/dL). Liver Function Tests: Recent Labs  Lab 03/31/20 2225  AST 23  ALT 28  ALKPHOS 71  BILITOT 0.6  PROT 7.7  ALBUMIN 4.6   Recent Labs  Lab 03/31/20 2225  LIPASE 28   No results for input(s): AMMONIA in the last 168 hours. Coagulation Profile: No results for input(s): INR, PROTIME in the last 168 hours. Cardiac Enzymes: No results  for input(s): CKTOTAL, CKMB, CKMBINDEX, TROPONINI in the last 168 hours. BNP (last 3 results) No results for input(s): PROBNP in the last 8760 hours. HbA1C: No results for input(s): HGBA1C in the last 72 hours. CBG: No results for input(s): GLUCAP in the last 168  hours. Lipid Profile: No results for input(s): CHOL, HDL, LDLCALC, TRIG, CHOLHDL, LDLDIRECT in the last 72 hours. Thyroid Function Tests: No results for input(s): TSH, T4TOTAL, FREET4, T3FREE, THYROIDAB in the last 72 hours. Anemia Panel: No results for input(s): VITAMINB12, FOLATE, FERRITIN, TIBC, IRON, RETICCTPCT in the last 72 hours.  --------------------------------------------------------------------------------------------------------------- Urine analysis:    Component Value Date/Time   COLORURINE YELLOW 06/12/2012 1200   APPEARANCEUR CLOUDY (A) 06/12/2012 1200   LABSPEC 1.013 06/12/2012 1200   PHURINE 8.0 06/12/2012 1200   GLUCOSEU NEG 06/12/2012 1200   HGBUR NEG 06/12/2012 1200   HGBUR trace-intact 08/27/2008 1011   BILIRUBINUR NEG 06/12/2012 1200   BILIRUBINUR NEG 02/22/2011 1154   KETONESUR NEG 06/12/2012 1200   PROTEINUR NEG 06/12/2012 1200   UROBILINOGEN 0.2 06/12/2012 1200   NITRITE NEG 06/12/2012 1200   LEUKOCYTESUR NEG 06/12/2012 1200      Imaging Results:    DG Chest 2 View  Result Date: 03/31/2020 CLINICAL DATA:  Chest pain EXAM: CHEST - 2 VIEW COMPARISON:  10/29/2015 FINDINGS: The heart size and mediastinal contours are within normal limits. Both lungs are clear. Small calcified lung nodule at the right base. The visualized skeletal structures are unremarkable. IMPRESSION: No active cardiopulmonary disease. Electronically Signed   By: Donavan Foil M.D.   On: 03/31/2020 22:25   CT Chest W Contrast  Result Date: 04/01/2020 CLINICAL DATA:  Epigastric and mid chest pain EXAM: CT CHEST, ABDOMEN, AND PELVIS WITH CONTRAST TECHNIQUE: Multidetector CT imaging of the chest, abdomen and pelvis was performed following the standard protocol during bolus administration of intravenous contrast. CONTRAST:  14mL OMNIPAQUE IOHEXOL 300 MG/ML  SOLN COMPARISON:  Chest radiograph 03/31/2020, CT 06/05/2011 FINDINGS: CT CHEST FINDINGS Cardiovascular: Normal heart size. No  pericardial effusion. Calcifications seen left coronary artery. The aorta is normal caliber. Normal 3 vessel branching of the aortic arch. Proximal great vessels are unremarkable. Central pulmonary arteries are normal caliber. No large central or lobar filling defects on this non tailored examination pulmonary arteries. Mediastinum/Nodes: Normal thyroid gland and thoracic inlet. No acute abnormality of the trachea. Mild distal esophageal thickening without paraesophageal, fluid or gas. Few calcified mediastinal and hilar nodes are present, likely sequela of remote granulomatous disease. No worrisome mediastinal, hilar or axillary adenopathy. Lungs/Pleura: Few calcified granulomata in the lungs (3/100). No suspicious pulmonary nodules or masses. No consolidation, features of edema, pneumothorax, or effusion. Musculoskeletal: Benign-appearing sclerotic focus in the proximal right humerus, partially collimated from cross-sectional imaging. No acute osseous abnormality or suspicious osseous lesion. No suspicious chest wall abnormalities. CT ABDOMEN PELVIS FINDINGS Hepatobiliary: No worrisome focal liver lesions. Smooth liver surface contour. Normal hepatic attenuation. Calcified gallstone seen within the proximal body of the gallbladder. No pericholecystic fluid or inflammation. No intraductal gallstones or biliary dilatation. Pancreas: Unremarkable. No pancreatic ductal dilatation or surrounding inflammatory changes. Spleen: Normal in size without focal abnormality. Adrenals/Urinary Tract: Normal adrenal glands. Kidneys are normally located with symmetric enhancement and excretion. Small focus of scarring in the posterior left upper pole. No suspicious renal lesion, urolithiasis or hydronephrosis. Urinary bladder is unremarkable aside from mild indentation of the bladder base by the prostate. Stomach/Bowel: Distal esophageal thickening, as above. Stomach and duodenum are unremarkable. No small bowel thickening  or  dilatation. A normal appendix is visualized. No proximal colonic dilatation or wall thickening. There are scattered distal colonic diverticula with more long segmental thickening of the sigmoid colon but without associated inflammation at this time. Vascular/Lymphatic: Minimal plaque in the distal aorta and iliac arteries. No other significant vascular findings. No suspicious or enlarged lymph nodes in the included lymphatic chains. Reproductive: Prostate at the upper limits of normal for size. Coarse eccentric calcification of the prostate. No concerning abnormalities of the prostate or seminal vesicles. Other: No abdominopelvic free fluid or free gas. No bowel containing hernias. Small fat containing left inguinal hernia. Musculoskeletal: No acute osseous abnormality or suspicious osseous lesion. Stable bone island in the left pubic body. Multilevel degenerative changes are present in the imaged portions of the spine. IMPRESSION: 1. Mild distal esophageal thickening without paraesophageal, fluid or gas. Findings may represent esophagitis, correlate with symptoms and consider direct visualization. 2. Scattered distal colonic diverticula with segmental thickening of the sigmoid without active inflammation at this time, may reflect sequela of prior diverticular inflammation though should consider correlation with prior colonoscopy if recently performed or direct visualization on an outpatient basis. 3. No other acute findings in the chest, abdomen or pelvis. 4. Cholelithiasis without CT evidence of acute cholecystitis. 5. Aortic Atherosclerosis (ICD10-I70.0). 6. Coronary artery calcifications are present. Please note that the presence of coronary artery calcium documents the presence of coronary artery disease, the severity of this disease and any potential stenosis cannot be assessed on this non-gated CT examination. Assessment for potential risk factor modification, dietary therapy or pharmacologic therapy may be  warranted. Electronically Signed   By: Lovena Le M.D.   On: 04/01/2020 01:13   CT ABDOMEN PELVIS W CONTRAST  Result Date: 04/01/2020 CLINICAL DATA:  Epigastric and mid chest pain EXAM: CT CHEST, ABDOMEN, AND PELVIS WITH CONTRAST TECHNIQUE: Multidetector CT imaging of the chest, abdomen and pelvis was performed following the standard protocol during bolus administration of intravenous contrast. CONTRAST:  123mL OMNIPAQUE IOHEXOL 300 MG/ML  SOLN COMPARISON:  Chest radiograph 03/31/2020, CT 06/05/2011 FINDINGS: CT CHEST FINDINGS Cardiovascular: Normal heart size. No pericardial effusion. Calcifications seen left coronary artery. The aorta is normal caliber. Normal 3 vessel branching of the aortic arch. Proximal great vessels are unremarkable. Central pulmonary arteries are normal caliber. No large central or lobar filling defects on this non tailored examination pulmonary arteries. Mediastinum/Nodes: Normal thyroid gland and thoracic inlet. No acute abnormality of the trachea. Mild distal esophageal thickening without paraesophageal, fluid or gas. Few calcified mediastinal and hilar nodes are present, likely sequela of remote granulomatous disease. No worrisome mediastinal, hilar or axillary adenopathy. Lungs/Pleura: Few calcified granulomata in the lungs (3/100). No suspicious pulmonary nodules or masses. No consolidation, features of edema, pneumothorax, or effusion. Musculoskeletal: Benign-appearing sclerotic focus in the proximal right humerus, partially collimated from cross-sectional imaging. No acute osseous abnormality or suspicious osseous lesion. No suspicious chest wall abnormalities. CT ABDOMEN PELVIS FINDINGS Hepatobiliary: No worrisome focal liver lesions. Smooth liver surface contour. Normal hepatic attenuation. Calcified gallstone seen within the proximal body of the gallbladder. No pericholecystic fluid or inflammation. No intraductal gallstones or biliary dilatation. Pancreas: Unremarkable. No  pancreatic ductal dilatation or surrounding inflammatory changes. Spleen: Normal in size without focal abnormality. Adrenals/Urinary Tract: Normal adrenal glands. Kidneys are normally located with symmetric enhancement and excretion. Small focus of scarring in the posterior left upper pole. No suspicious renal lesion, urolithiasis or hydronephrosis. Urinary bladder is unremarkable aside from mild indentation of the bladder base by  the prostate. Stomach/Bowel: Distal esophageal thickening, as above. Stomach and duodenum are unremarkable. No small bowel thickening or dilatation. A normal appendix is visualized. No proximal colonic dilatation or wall thickening. There are scattered distal colonic diverticula with more long segmental thickening of the sigmoid colon but without associated inflammation at this time. Vascular/Lymphatic: Minimal plaque in the distal aorta and iliac arteries. No other significant vascular findings. No suspicious or enlarged lymph nodes in the included lymphatic chains. Reproductive: Prostate at the upper limits of normal for size. Coarse eccentric calcification of the prostate. No concerning abnormalities of the prostate or seminal vesicles. Other: No abdominopelvic free fluid or free gas. No bowel containing hernias. Small fat containing left inguinal hernia. Musculoskeletal: No acute osseous abnormality or suspicious osseous lesion. Stable bone island in the left pubic body. Multilevel degenerative changes are present in the imaged portions of the spine. IMPRESSION: 1. Mild distal esophageal thickening without paraesophageal, fluid or gas. Findings may represent esophagitis, correlate with symptoms and consider direct visualization. 2. Scattered distal colonic diverticula with segmental thickening of the sigmoid without active inflammation at this time, may reflect sequela of prior diverticular inflammation though should consider correlation with prior colonoscopy if recently performed or  direct visualization on an outpatient basis. 3. No other acute findings in the chest, abdomen or pelvis. 4. Cholelithiasis without CT evidence of acute cholecystitis. 5. Aortic Atherosclerosis (ICD10-I70.0). 6. Coronary artery calcifications are present. Please note that the presence of coronary artery calcium documents the presence of coronary artery disease, the severity of this disease and any potential stenosis cannot be assessed on this non-gated CT examination. Assessment for potential risk factor modification, dietary therapy or pharmacologic therapy may be warranted. Electronically Signed   By: Lovena Le M.D.   On: 04/01/2020 01:13    My personal review of EKG: Rhythm NSR, no ST-T changes   Assessment & Plan:    Active Problems:   Chest pain   1. Atypical chest pain-likely GI origin from GERD.  Improved after GI cocktail.  Patient received Protonix in the ED.  We will continue with Protonix 40 mg p.o. daily.  Patient has a heart score of 4.  Initial troponin is negative.  We will cycle troponin x2 to rule out ACS. 2. HIV-last CD4 count was 946 in December 2020.  Followed by ID as outpatient.  Continue [name] antiretroviral therapy.    DVT Prophylaxis-   Lovenox   AM Labs Ordered, also please review Full Orders  Family Communication: Admission, patients condition and plan of care including tests being ordered have been discussed with the patient  who indicate understanding and agree with the plan and Code Status.  Code Status: Full code  Admission status: Observation  Time spent in minutes : 60 minutes   Jaevian Shean S Jacobb Alen M.D

## 2020-04-01 NOTE — Progress Notes (Signed)
*  PRELIMINARY RESULTS* Echocardiogram 2D Echocardiogram has been performed.  Luis Gomez 04/01/2020, 11:31 AM

## 2020-04-01 NOTE — Consult Note (Signed)
Referring Provider: [facility] Primary Care Physician:  Sharilyn Sites, MD Primary Gastroenterologist:  Dr. Gala Romney  Date of Admission: 04/01/20 Date of Consultation: 04/01/20  Reason for Consultation:  GERD, Esophagitis ? EGD  HPI:  Luis Gomez is a 64 y.o. male with a past medical history of GERD, hypertension, hyperlipidemia, diabetes.  The patient presented with chest pain and nausea/vomiting.  Significant other at the bedside notes patient sick for several days.  Estimated 5-6 episodes of vomiting today.  No black or bloody stools.  Noted chest pain yesterday evening for 2 to 3 hours that was burning postprandially and resolved.  No issues swallowing.  Patient thought maybe prednisone for sciatica made him sick.  Labs including CBC, HFP, lipase, troponins essentially unremarkable.  SARS-CoV-2 by PCR negative.  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  EGD in 2007 as outlined below.  CT shows evidence of esophagitis.  Overall felt nausea and vomiting likely due to reflux esophagitis.  Admitted for observation and consider possible EGD while inpatient. Received Protonix in the ED and recommended Protonix 40 mg daily while admitted.  The patient was last seen in our office 10/25/2011 for GERD and abdominal pain which was improved at that time.  No upper GI symptoms at that time.  Recommended continue Prevacid, follow-up in 6 months.  EGD 07/02/2006 found normal esophagus, stomach, D1, D2.  Colonoscopy at the same time with normal rectum, left-sided diverticulum, normal colonic mucosa.  Recommended continue AcipHex 20 mg daily.  Review of home medications finds aspirin 81 mg daily, not currently on a PPI.  Also on meloxicam twice daily.  Today he states he speaks some English and is able to understand me. Not currently on a PPI. Did not eat at all yesterday or today. Had some N/V yesterday with GERD symptoms.  Previous GERD as well. No abdominal pain, N/V, today. Is not on any NSAIDs other than Meloxicam or ASA. Denies hematemesis, hematochezia, melena. No other GI complaints at this time.  Past Medical History:  Diagnosis Date  . Diverticulosis   . Genital HSV   . GERD (gastroesophageal reflux disease)    Last EGD->07/02/06 normal  . HIV disease ([place])   . S/P colonoscopy 07/02/2006   Left sided diverticula    Past Surgical History:  Procedure Laterality Date  . BACK SURGERY    . COLONOSCOPY  07/03/2006   Normal rectum, left-sided diverticulum  . COLONOSCOPY N/A 02/07/2018   Procedure: COLONOSCOPY;  Surgeon: Daneil Dolin, MD;  Location: [facility];  Service: Endoscopy;  Laterality: N/A;  7:30 - pt knows to arrive at 7:00  . ESOPHAGOGASTRODUODENOSCOPY  07/03/2006   Normal esophagus, stomach, D1, D2.  Marland Kitchen POLYPECTOMY  02/07/2018   Procedure: POLYPECTOMY;  Surgeon: Daneil Dolin, MD;  Location: [facility];  Service: Endoscopy;;  colon     Prior to Admission medications   Medication Sig Start Date End Date Taking? Authorizing Provider  aspirin EC 81 MG tablet Take 1 tablet (81 mg total) by mouth daily. 05/24/15   Campbell Riches, MD  elvitegravir-cobicistat-emtricitabine-tenofovir ([name]) 150-150-200-10 MG TABS tablet Take 1 tablet by mouth daily with breakfast. 01/19/20   Campbell Riches, MD  meloxicam (MOBIC) 7.5 MG tablet Take 7.5 mg by mouth 2 (two) times daily. 02/29/20   [provider]  Omega-3 Fatty Acids (FISH OIL) 1000 MG CAPS Take 1,000 mg by mouth daily.  [provider]  sildenafil (REVATIO) 20 MG tablet Take 10 mg by mouth daily as needed (for ED).     [provider]  valACYclovir (VALTREX) 500 MG tablet Take 1 tablet (500 mg total) by mouth 2 (two) times daily. 01/21/19   Campbell Riches, MD    Current Facility-Administered Medications  Medication Dose Route Frequency Provider Last Rate Last Admin  . 0.9 %  sodium chloride infusion    Intravenous Continuous Oswald Hillock, MD 10 mL/hr at 04/01/20 0616 New Bag at 04/01/20 4098  . acetaminophen (TYLENOL) tablet 650 mg  650 mg Oral Q4H PRN Oswald Hillock, MD      . aspirin EC tablet 81 mg  81 mg Oral Daily [place], Marge Duncans, MD      . elvitegravir-cobicistat-emtricitabine-tenofovir ([name]) 150-150-200-10 MG tablet 1 tablet  1 tablet Oral Q breakfast [place], Marge Duncans, MD      . enoxaparin (LOVENOX) injection 40 mg  40 mg Subcutaneous Q24H Oswald Hillock, MD   40 mg at 04/01/20 1053  . ondansetron (ZOFRAN) injection 4 mg  4 mg Intravenous Q6H PRN Oswald Hillock, MD      . pantoprazole (PROTONIX) injection 40 mg  40 mg Intravenous Q12H Shahmehdi, Seyed A, MD   40 mg at 04/01/20 1053  . sodium chloride flush (NS) 0.9 % injection 3 mL  3 mL Intravenous Once [place], Marge Duncans, MD      . sucralfate (CARAFATE) 1 GM/10ML suspension 1 g  1 g Oral TID WC & HS Shahmehdi, Seyed A, MD      . valACYclovir (VALTREX) tablet 500 mg  500 mg Oral BID Oswald Hillock, MD       Current Outpatient Medications  Medication Sig Dispense Refill  . aspirin EC 81 MG tablet Take 1 tablet (81 mg total) by mouth daily. 30 tablet 11  . elvitegravir-cobicistat-emtricitabine-tenofovir ([name]) 150-150-200-10 MG TABS tablet Take 1 tablet by mouth daily with breakfast. 30 tablet 3  . meloxicam (MOBIC) 7.5 MG tablet Take 7.5 mg by mouth 2 (two) times daily.    . Omega-3 Fatty Acids (FISH OIL) 1000 MG CAPS Take 1,000 mg by mouth daily.     . sildenafil (REVATIO) 20 MG tablet Take 10 mg by mouth daily as needed (for ED).     . valACYclovir (VALTREX) 500 MG tablet Take 1 tablet (500 mg total) by mouth 2 (two) times daily. 14 tablet 5    Allergies as of 03/31/2020  . (No Known Allergies)    No family history on file.  Social History   Socioeconomic History  . Marital status: Divorced    Spouse name: Not on file  . Number of children: 4  . Years of education: Not on file  . Highest education level: Not on file   Occupational History  . Occupation: Equity    Employer: [employer]  Tobacco Use  . Smoking status: Never Smoker  . Smokeless tobacco: Never Used  Substance and Sexual Activity  . Alcohol use: No  . Drug use: No  . Sexual activity: Not Currently    Comment: pt. given condoms  Other Topics Concern  . Not on file  Social History Narrative  . Not on file   Social Determinants of Health   Financial Resource Strain:   . Difficulty of Paying Living Expenses:   Food Insecurity:   . Worried About Charity fundraiser in the Last Year:   . [employer] of  Food in the Last Year:   Transportation Needs:   . Film/video editor (Medical):   Marland Kitchen Lack of Transportation (Non-Medical):   Physical Activity:   . Days of Exercise per Week:   . Minutes of Exercise per Session:   Stress:   . Feeling of Stress :   Social Connections:   . Frequency of Communication with Friends and Family:   . Frequency of Social Gatherings with Friends and Family:   . Attends Religious Services:   . Active Member of Clubs or Organizations:   . Attends Archivist Meetings:   Marland Kitchen Marital Status:   Intimate Partner Violence:   . Fear of Current or Ex-Partner:   . Emotionally Abused:   Marland Kitchen Physically Abused:   . Sexually Abused:     Review of Systems: General: Negative for anorexia, weight loss, fever, chills, fatigue, weakness. ENT: Negative for hoarseness, difficulty swallowing. CV: Negative for chest pain, angina, palpitations, peripheral edema.  Respiratory: Negative for dyspnea at rest, cough, sputum, wheezing.  GI: See history of present illness. GU:  Negative for dysuria, hematuria, urinary incontinence, urinary frequency, nocturnal urination.  MS: Negative for joint pain, low back pain.  Derm: Negative for rash or itching.  Endo: Negative for unusual weight change.  Allergy: Negative for rash or hives.  Physical Exam: Vital signs in last 24 hours: Temp:  [98.7 F (37.1 C)] 98.7 F (37.1  C) (05/27 2200) Pulse Rate:  [69-83] 72 (05/28 1030) Resp:  [13-23] 23 (05/28 1030) BP: (111-152)/(70-95) 130/83 (05/28 1030) SpO2:  [93 %-100 %] 98 % (05/28 1030) Weight:  [86.2 kg] 86.2 kg (05/27 2200)   General:   Alert,  Well-developed, well-nourished, pleasant and cooperative in NAD Head:  Normocephalic and atraumatic. Eyes:  Sclera clear, no icterus. Conjunctiva pink. Ears:  Normal auditory acuity. Neck:  Supple; no masses or thyromegaly. Lungs:  Clear throughout to auscultation. No wheezes, crackles, or rhonchi. No acute distress. Heart:  Regular rate and rhythm; no murmurs, clicks, rubs, or gallops. Abdomen:  Soft, nontender and nondistended. No masses, hepatosplenomegaly or hernias noted. Normal bowel sounds, without guarding, and without rebound.   Rectal:  Deferred until time of colonoscopy.   Msk:  Symmetrical without gross deformities. Pulses:  Normal bilateral DP pulses noted. Extremities:  Without clubbing or edema. Neurologic:  Alert and  oriented x4;  grossly normal neurologically. Psych:  Alert and cooperative. Normal mood and affect.  Intake/Output from previous day: No intake/output data recorded. Intake/Output this shift: No intake/output data recorded.  Lab Results: Recent Labs    03/31/20 2225  WBC 8.6  HGB 14.4  HCT 44.2  PLT 317   BMET Recent Labs    03/31/20 2225  NA 135  K 3.6  CL 97*  CO2 25  GLUCOSE 126*  BUN 13  CREATININE 0.98  CALCIUM 9.2   LFT Recent Labs    03/31/20 2225  PROT 7.7  ALBUMIN 4.6  AST 23  ALT 28  ALKPHOS 71  BILITOT 0.6  BILIDIR <0.1  IBILI NOT CALCULATED   PT/INR No results for input(s): LABPROT, INR in the last 72 hours. Hepatitis Panel No results for input(s): HEPBSAG, HCVAB, HEPAIGM, HEPBIGM in the last 72 hours. C-Diff No results for input(s): CDIFFTOX in the last 72 hours.  Studies/Results: DG Chest 2 View  Result Date: 03/31/2020 CLINICAL DATA:  Chest pain EXAM: CHEST - 2 VIEW COMPARISON:   10/29/2015 FINDINGS: The heart size and mediastinal contours are within normal limits. Both  lungs are clear. Small calcified lung nodule at the right base. The visualized skeletal structures are unremarkable. IMPRESSION: No active cardiopulmonary disease. Electronically Signed   By: Donavan Foil M.D.   On: 03/31/2020 22:25   CT Chest W Contrast  Result Date: 04/01/2020 CLINICAL DATA:  Epigastric and mid chest pain EXAM: CT CHEST, ABDOMEN, AND PELVIS WITH CONTRAST TECHNIQUE: Multidetector CT imaging of the chest, abdomen and pelvis was performed following the standard protocol during bolus administration of intravenous contrast. CONTRAST:  125m OMNIPAQUE IOHEXOL 300 MG/ML  SOLN COMPARISON:  Chest radiograph 03/31/2020, CT 06/05/2011 FINDINGS: CT CHEST FINDINGS Cardiovascular: Normal heart size. No pericardial effusion. Calcifications seen left coronary artery. The aorta is normal caliber. Normal 3 vessel branching of the aortic arch. Proximal great vessels are unremarkable. Central pulmonary arteries are normal caliber. No large central or lobar filling defects on this non tailored examination pulmonary arteries. Mediastinum/Nodes: Normal thyroid gland and thoracic inlet. No acute abnormality of the trachea. Mild distal esophageal thickening without paraesophageal, fluid or gas. Few calcified mediastinal and hilar nodes are present, likely sequela of remote granulomatous disease. No worrisome mediastinal, hilar or axillary adenopathy. Lungs/Pleura: Few calcified granulomata in the lungs (3/100). No suspicious pulmonary nodules or masses. No consolidation, features of edema, pneumothorax, or effusion. Musculoskeletal: Benign-appearing sclerotic focus in the proximal right humerus, partially collimated from cross-sectional imaging. No acute osseous abnormality or suspicious osseous lesion. No suspicious chest wall abnormalities. CT ABDOMEN PELVIS FINDINGS Hepatobiliary: No worrisome focal liver lesions. Smooth  liver surface contour. Normal hepatic attenuation. Calcified gallstone seen within the proximal body of the gallbladder. No pericholecystic fluid or inflammation. No intraductal gallstones or biliary dilatation. Pancreas: Unremarkable. No pancreatic ductal dilatation or surrounding inflammatory changes. Spleen: Normal in size without focal abnormality. Adrenals/Urinary Tract: Normal adrenal glands. Kidneys are normally located with symmetric enhancement and excretion. Small focus of scarring in the posterior left upper pole. No suspicious renal lesion, urolithiasis or hydronephrosis. Urinary bladder is unremarkable aside from mild indentation of the bladder base by the prostate. Stomach/Bowel: Distal esophageal thickening, as above. Stomach and duodenum are unremarkable. No small bowel thickening or dilatation. A normal appendix is visualized. No proximal colonic dilatation or wall thickening. There are scattered distal colonic diverticula with more long segmental thickening of the sigmoid colon but without associated inflammation at this time. Vascular/Lymphatic: Minimal plaque in the distal aorta and iliac arteries. No other significant vascular findings. No suspicious or enlarged lymph nodes in the included lymphatic chains. Reproductive: Prostate at the upper limits of normal for size. Coarse eccentric calcification of the prostate. No concerning abnormalities of the prostate or seminal vesicles. Other: No abdominopelvic free fluid or free gas. No bowel containing hernias. Small fat containing left inguinal hernia. Musculoskeletal: No acute osseous abnormality or suspicious osseous lesion. Stable bone island in the left pubic body. Multilevel degenerative changes are present in the imaged portions of the spine. IMPRESSION: 1. Mild distal esophageal thickening without paraesophageal, fluid or gas. Findings may represent esophagitis, correlate with symptoms and consider direct visualization. 2. Scattered distal  colonic diverticula with segmental thickening of the sigmoid without active inflammation at this time, may reflect sequela of prior diverticular inflammation though should consider correlation with prior colonoscopy if recently performed or direct visualization on an outpatient basis. 3. No other acute findings in the chest, abdomen or pelvis. 4. Cholelithiasis without CT evidence of acute cholecystitis. 5. Aortic Atherosclerosis (ICD10-I70.0). 6. Coronary artery calcifications are present. Please note that the presence of coronary artery calcium documents  the presence of coronary artery disease, the severity of this disease and any potential stenosis cannot be assessed on this non-gated CT examination. Assessment for potential risk factor modification, dietary therapy or pharmacologic therapy may be warranted. Electronically Signed   By: Lovena Le M.D.   On: 04/01/2020 01:13   CT ABDOMEN PELVIS W CONTRAST  Result Date: 04/01/2020 CLINICAL DATA:  Epigastric and mid chest pain EXAM: CT CHEST, ABDOMEN, AND PELVIS WITH CONTRAST TECHNIQUE: Multidetector CT imaging of the chest, abdomen and pelvis was performed following the standard protocol during bolus administration of intravenous contrast. CONTRAST:  182m OMNIPAQUE IOHEXOL 300 MG/ML  SOLN COMPARISON:  Chest radiograph 03/31/2020, CT 06/05/2011 FINDINGS: CT CHEST FINDINGS Cardiovascular: Normal heart size. No pericardial effusion. Calcifications seen left coronary artery. The aorta is normal caliber. Normal 3 vessel branching of the aortic arch. Proximal great vessels are unremarkable. Central pulmonary arteries are normal caliber. No large central or lobar filling defects on this non tailored examination pulmonary arteries. Mediastinum/Nodes: Normal thyroid gland and thoracic inlet. No acute abnormality of the trachea. Mild distal esophageal thickening without paraesophageal, fluid or gas. Few calcified mediastinal and hilar nodes are present, likely sequela  of remote granulomatous disease. No worrisome mediastinal, hilar or axillary adenopathy. Lungs/Pleura: Few calcified granulomata in the lungs (3/100). No suspicious pulmonary nodules or masses. No consolidation, features of edema, pneumothorax, or effusion. Musculoskeletal: Benign-appearing sclerotic focus in the proximal right humerus, partially collimated from cross-sectional imaging. No acute osseous abnormality or suspicious osseous lesion. No suspicious chest wall abnormalities. CT ABDOMEN PELVIS FINDINGS Hepatobiliary: No worrisome focal liver lesions. Smooth liver surface contour. Normal hepatic attenuation. Calcified gallstone seen within the proximal body of the gallbladder. No pericholecystic fluid or inflammation. No intraductal gallstones or biliary dilatation. Pancreas: Unremarkable. No pancreatic ductal dilatation or surrounding inflammatory changes. Spleen: Normal in size without focal abnormality. Adrenals/Urinary Tract: Normal adrenal glands. Kidneys are normally located with symmetric enhancement and excretion. Small focus of scarring in the posterior left upper pole. No suspicious renal lesion, urolithiasis or hydronephrosis. Urinary bladder is unremarkable aside from mild indentation of the bladder base by the prostate. Stomach/Bowel: Distal esophageal thickening, as above. Stomach and duodenum are unremarkable. No small bowel thickening or dilatation. A normal appendix is visualized. No proximal colonic dilatation or wall thickening. There are scattered distal colonic diverticula with more long segmental thickening of the sigmoid colon but without associated inflammation at this time. Vascular/Lymphatic: Minimal plaque in the distal aorta and iliac arteries. No other significant vascular findings. No suspicious or enlarged lymph nodes in the included lymphatic chains. Reproductive: Prostate at the upper limits of normal for size. Coarse eccentric calcification of the prostate. No concerning  abnormalities of the prostate or seminal vesicles. Other: No abdominopelvic free fluid or free gas. No bowel containing hernias. Small fat containing left inguinal hernia. Musculoskeletal: No acute osseous abnormality or suspicious osseous lesion. Stable bone island in the left pubic body. Multilevel degenerative changes are present in the imaged portions of the spine. IMPRESSION: 1. Mild distal esophageal thickening without paraesophageal, fluid or gas. Findings may represent esophagitis, correlate with symptoms and consider direct visualization. 2. Scattered distal colonic diverticula with segmental thickening of the sigmoid without active inflammation at this time, may reflect sequela of prior diverticular inflammation though should consider correlation with prior colonoscopy if recently performed or direct visualization on an outpatient basis. 3. No other acute findings in the chest, abdomen or pelvis. 4. Cholelithiasis without CT evidence of acute cholecystitis. 5. Aortic Atherosclerosis (ICD10-I70.0).  6. Coronary artery calcifications are present. Please note that the presence of coronary artery calcium documents the presence of coronary artery disease, the severity of this disease and any potential stenosis cannot be assessed on this non-gated CT examination. Assessment for potential risk factor modification, dietary therapy or pharmacologic therapy may be warranted. Electronically Signed   By: Lovena Le M.D.   On: 04/01/2020 01:13    Impression: Very pleasant 64 year old male who presented to the ED with complaints of chest pain, n/V. Has a history of GERD and seen by our practice remotely in 2012 and on Prevacid at that time. Last EGD 2007 as outlined above. Is on NSAIDs in the form of ASA 81 daily and Meloxicam 7.5 mg bid; no other NSAIDs. Chest pain improved after GI cocktail. No further chest pain, N/V at this time. Non-tender on exam. No other overt GI complaints. CT with evidence of  esophagitis.  Differentials for chest pain, N/V include reflux esophagitis, gastritis, duodenitis in the setting of NSAIDs use. Cardiac etiology has been ruled out with troponins and EKG.   Discussed likelyhood of EGD, patient agrees to proceed. No medications to necessitate need for propofol/MAC (last EGD on conscious sedation as well).  Plan: 1. Plan EGD today with Dr. Gala Romney on conscious sedation 2. Protonic 40 mg bid as IP; OP dosing pending EGD results 3. Supportive measures   Thank you for allowing [name] to participate in the care of Luis Gomez  Walden Field, DNP, AGNP-C Adult & Gerontological Nurse Practitioner [facility]   LOS: 0 days     04/01/2020, 11:18 AM

## 2020-04-01 NOTE — Discharge Instructions (Signed)
EGD Discharge instructions Please read the instructions outlined below and refer to this sheet in the next few weeks. These discharge instructions provide you with general information on caring for yourself after you leave the hospital. Your doctor may also give you specific instructions. While your treatment has been planned according to the most current medical practices available, unavoidable complications occasionally occur. If you have any problems or questions after discharge, please call your doctor. ACTIVITY  You may resume your regular activity but move at a slower pace for the next 24 hours.   Take frequent rest periods for the next 24 hours.   Walking will help expel (get rid of) the air and reduce the bloated feeling in your abdomen.   No driving for 24 hours (because of the anesthesia (medicine) used during the test).   You may shower.   Do not sign any important legal documents or operate any machinery for 24 hours (because of the anesthesia used during the test).  NUTRITION  Drink plenty of fluids.   You may resume your normal diet.   Begin with a light meal and progress to your normal diet.   Avoid alcoholic beverages for 24 hours or as instructed by your caregiver.  MEDICATIONS  You may resume your normal medications unless your caregiver tells you otherwise.  WHAT YOU CAN EXPECT TODAY  You may experience abdominal discomfort such as a feeling of fullness or "gas" pains.  FOLLOW-UP  Your doctor will discuss the results of your test with you.  SEEK IMMEDIATE MEDICAL ATTENTION IF ANY OF THE FOLLOWING OCCUR:  Excessive nausea (feeling sick to your stomach) and/or vomiting.   Severe abdominal pain and distention (swelling).   Trouble swallowing.   Temperature over 101 F (37.8 C).   Rectal bleeding or vomiting of blood.    You have severe acid reflux burns in your esophagus.  Begin Protonix 40 mg twice daily (before breakfast and supper)  Take Carafate  suspension-1 g before lunch, supper and at bedtime daily for the next 5 days and then as needed after that.  Do not take around breakfast time when you take your HIV medication.  GERD information provided  Office visit with [name] in 6 weeks  At patient request, I called [name] at [phone] and left voicemail with findings and recommendations   Enfermedad de reflujo gastroesofgico en los adultos Gastroesophageal Reflux Disease, Adult El reflujo gastroesofgico (RGE) ocurre cuando el cido del estmago sube por el tubo que conecta la boca con el estmago (esfago). Normalmente, la comida baja por el esfago y se mantiene en el estmago, donde se la digiere. Sin embargo, cuando una persona tiene [place], los alimentos y el cido estomacal suelen volver al esfago. Si esto se vuelve un problema ms grave, a la persona se le puede diagnosticar una enfermedad llamada enfermedad de reflujo gastroesofgico (ERGE). La ERGE ocurre cuando el reflujo:  Sucede a menudo.  Causa sntomas frecuentes o graves.  Causa problemas tales como dao en el esfago. Cuando el cido del Insurance claims handler en contacto con el esfago, el cido puede provocar dolor (inflamacin) en el esfago. Con el tiempo, pueden formarse pequeos agujeros (lceras) en el revestimiento del esfago. Cules son las causas? Esta afeccin se debe a un problema en el msculo que se encuentra entre el esfago y Product manager (esfnter esofgico inferior, o EEI). Normalmente, el EEI se cierra una vez que la comida pasa a travs del esfago hasta el [place]. Cuando el EEI se encuentra debilitado  o tiene alguna anomala, no se cierra por completo, y eso permite que tanto la comida como el jugo gstrico, que es cido, [place] a subir por el esfago. El EEI puede debilitarse a causa de ciertas sustancias alimenticias, medicamentos y Product/process development scientist, que incluyen:  El consumo de [place].  [place].  Tener una hernia de hiato.  Consumo de alcohol.  Ciertos  alimentos y bebidas, como caf, chocolate, cebollas y [place]. Qu incrementa el riesgo? Es ms probable que tenga esta afeccin si:  Tiene un aumento del Engineer, site.  Tiene un trastorno del tejido conjuntivo.  [place] antiinflamatorios no esteroideos (AINE). Cules son los signos o los sntomas? Los sntomas de esta afeccin incluyen:  Acidez estomacal.  Dificultad o dolor al tragar.  Sensacin de Best boy un bulto en la garganta.  Sabor amargo en la boca.  Mal aliento.  Gran cantidad de saliva.  Estmago inflamado o con Tree surgeon.  Eructos.  Dolor en el pecho. El dolor de pecho puede deberse a distintas afecciones. Es importante que consulte al mdico si tiene dolor de [place].  Dificultad para respirar o sibilancias.  Tos constante (crnica) o tos nocturna.  Desgaste del Paramedic.  Prdida de peso. Cmo se diagnostica? El mdico le har una historia clnica y un examen fsico. Para determinar si tiene ERGE leve o grave, el mdico tambin puede controlar cmo usted reacciona al tratamiento. Tambin pueden [employer], que [place] los siguientes:  Un estudio para examinarle el [place] y el esfago con una cmara pequea (endoscopa).  Una prueba para medir el grado de Musician.  Una prueba para medir cunta presin hay en el esfago.  Un estudio de deglucin con bario comn o modificado para ver la forma, el tamao y el funcionamiento del esfago. Cmo se trata? El [place] del tratamiento es ayudar a Public house manager los sntomas y Product/process development scientist las complicaciones. El tratamiento de esta afeccin puede variar segn la gravedad de los sntomas. El mdico puede recomendarle lo siguiente:  Cambios en la dieta.  Medicamentos.  [place]. Siga estas indicaciones en su casa: Comida y bebida   Siga la dieta recomendada por el mdico. Esto puede incluir evitar ciertos alimentos y bebidas, por ejemplo: ? Caf y t (con o sin cafena). ? Bebidas que  contengan alcohol. ? Bebidas energticas y deportivas. ? Bebidas gaseosas o refrescos. ? Chocolate y cacao. ? Menta y [place]. ? Ajo y cebolla. ? [place]ano picante. ? Alimentos condimentados, picantes y cidos, por ejemplo, todos los tipos de pimientas, [place] en polvo, curry en polvo, vinagre, salsas picantes y [employer]. ? Ctricos y sus jugos, por ejemplo, naranjas, limones y limas. ? Alimentos a base de tomate, como salsa de [place], [place], salsa picante y pizza con salsa de [place]. ? Alimentos fritos y [place], [place] donas, papas fritas y aderezos ricos en grasas. ? Carnes con alto contenido de grasa, como salchichas, y cortes de carnes rojas y blancas con mucha grasa, por ejemplo, chuletas o costillas, embutidos, jamn y tocino. ? Productos lcteos ricos en grasas, como leche [place], [place] y [place] crema.  Haga comidas pequeas y frecuentes Medical sales representative de comidas abundantes.  Evite beber grandes cantidades de lquidos con las comidas.  Evite comer 2 o 3horas antes de acostarse.  Evite recostarse inmediatamente despus de comer.  No haga ejercicios enseguida despus de comer. Estilo de vida   No consuma ningn producto que contenga nicotina o tabaco, como cigarrillos, cigarrillos electrnicos y tabaco de Higher education careers adviser. Si  necesita ayuda para dejar de fumar, consulte al [employer].  Trate de reducir el estrs con mtodos como el yoga o la meditacin. Si necesita ayuda para reducir [employer] de estrs, consulte al mdico.  Si tiene sobrepeso, baje hasta llegar a un peso saludable para usted. [place] consejos al mdico para bajar de peso de [place] segura. Indicaciones generales  Est atento a cualquier cambio en los sntomas.  Tome los medicamentos de venta libre y los recetados solamente como se lo haya indicado el mdico. No tome aspirina, ibuprofeno ni otros AINE a menos que el mdico se lo indique.  Use ropa holgada. No use nada apretado alrededor de la cintura que  haga presin sobre el abdomen.  Levante (eleve) la cabecera de la cama aproximadamente 6pulgadas (15cm).  Evite inclinarse si al hacerlo empeoran los sntomas.  Concurra a todas las visitas de seguimiento como se lo haya indicado el mdico. Esto es importante. Comunquese con un mdico si:  Tiene los siguientes sntomas: ? Sntomas nuevos. ? Prdida de peso sin causa aparente. ? Dificultad o dolor al tragar. ? Sibilancias o una tos persistente. ? Voz ronca.  Los sntomas no mejoran con Dispensing optician. Solicite ayuda inmediatamente si:  [employer], el cuello, la [place], los dientes o la espalda.  Se siente transpirado, mareado o tiene una sensacin de desvanecimiento.  Siente dolor intenso en el pecho o le falta el aire.  Vomita y el vmito tiene un aspecto similar a la sangre o a los posos de caf.  Se desmaya.  Tiene heces sanguinolentas o negras.  No puede tragar, beber o comer. Resumen  El reflujo gastroesofgico ocurre cuando el cido del estmago sube al esfago. La ERGE es una enfermedad en la que el reflujo ocurre con frecuencia, causa sntomas frecuentes o graves, o causa problemas tales como dao en el esfago.  El tratamiento de esta afeccin puede variar segn la gravedad de los sntomas. El mdico puede indicarle que siga una dieta y haga cambios en su estilo de vida, tome medicamentos o se someta a [place].  Comunquese con un mdico si tiene sntomas nuevos o los sntomas empeoran.  Tome los medicamentos de venta libre y los recetados solamente como se lo haya indicado el mdico. No tome aspirina, ibuprofeno ni otros AINE a menos que el mdico se lo indique.  Concurra a todas las visitas de seguimiento como se lo haya indicado el mdico. Esto es importante. Esta informacin no tiene Marine scientist el consejo del mdico. Asegrese de hacerle al mdico cualquier pregunta que tenga. Document Revised: 06/05/2018 Document Reviewed:  06/05/2018 Elsevier Patient Education  [place].

## 2020-04-01 NOTE — Progress Notes (Signed)
PROGRESS NOTE    Patient: Luis Gomez                            PCP: Sharilyn Sites, MD                    DOB: [birth year]            DOA: 03/31/2020 [id number]             DOS: 04/01/2020, 11:47 AM   LOS: 0 days   Date of Service: The patient was seen and examined on 04/01/2020  Subjective:   The patient was seen and examined this Am. Stable  Still complaining of: Some nausea improved substernal chest pain Otherwise no issues overnight .  Brief Narrative:   Luis Gomez  is a 64 y.o. hispanic male, with history of GERD, hypertension, hyperlipidemia, diabetes mellitus type 2, HIV came to ED with complaints of chest pain with nausea vomiting.  Patient had 5-6 episodes of vomiting.  Denies any black or bloody stools.  He also had brief episode of chest pain which came on after eating.  No previous cardiac history.  Denies any abdominal pain. CT scan showed evidence of esophagitis as well as CAD.  Initial troponin was negative. Patient's chest pain has improved after he received GI cocktail in the ED.   Assessment & Plan:   Active Problems:   Chest pain   Atypical chest pain -likely GI origin from GERD.  Improved after GI cocktail.  -Cardiac enzymes negative, EKG within normal limits  Patient received Protonix in the ED.  We will continue with Protonix 40 mg p.o. daily.   -GI consulted for evaluation, recommending inpatient possible EGD due to his history of HIV, and presentation.   Patient has a heart score of 4.  Initial troponin are  negative. ACS is ruled out at this time -Recommending outpatient evaluation for cardiac stress test  HIV -last CD4 count was 946 in December 2020.   Followed by ID as outpatient.   Continue [name] antiretroviral therapy. -Reports that he is compliant with his medication, his blood work is monitored every 6 to 9 months    Nutritional status:        Cultures; None   Antimicrobials: None   Consultants:  GI  ?? EGD today   ------------------------------------------------------------------------------------------------------------------------------------------------  DVT prophylaxis: SCD/Compression stockings and Lovenox SQ Code Status:   Code Status: Full Code Family Communication:  No family member present at bedside-  The above findings and plan of care has been discussed with patient  in detail,  they expressed understanding and agreement of above. -Advance care planning has been discussed.   Admission status:   Status is: Observation  The patient remains OBS appropriate and will d/c before 2 midnights.  Dispo: The patient is from: Home              Anticipated d/c is to: Home              Anticipated d/c date is: 1 day              Patient currently is not medically stable to d/c.          Procedures:   ECHO  ?? EGD      Antimicrobials:  Anti-infectives (From admission, onward)   Start     Dose/Rate Route Frequency Ordered Stop   04/01/20 1000  valACYclovir (VALTREX) tablet  500 mg     500 mg Oral 2 times daily 04/01/20 0539     04/01/20 0800  elvitegravir-cobicistat-emtricitabine-tenofovir ([name]) 150-150-200-10 MG tablet 1 tablet     1 tablet Oral Daily with breakfast 04/01/20 0539         Medication:  . aspirin EC  81 mg Oral Daily  . elvitegravir-cobicistat-emtricitabine-tenofovir  1 tablet Oral Q breakfast  . enoxaparin (LOVENOX) injection  40 mg Subcutaneous Q24H  . pantoprazole (PROTONIX) IV  40 mg Intravenous Q12H  . sodium chloride flush  3 mL Intravenous Once  . sucralfate  1 g Oral TID WC & HS  . valACYclovir  500 mg Oral BID    acetaminophen, ondansetron (ZOFRAN) IV   Objective:   Vitals:   04/01/20 0900 04/01/20 0930 04/01/20 1000 04/01/20 1030  BP: 123/79 137/86 134/78 130/83  Pulse: 73 75 71 72  Resp: 14 (!) 21 18 (!) 23  Temp:      TempSrc:      SpO2: 100% 98% 96% 98%  Weight:      Height:       No intake or output data  in the 24 hours ending 04/01/20 1147 Filed Weights   03/31/20 2200  Weight: 86.2 kg     Examination:   Physical Exam  Constitution:  Alert, cooperative, no distress,  Appears calm and comfortable  Psychiatric: Normal and stable mood and affect, cognition intact,   HEENT: Normocephalic, PERRL, otherwise with in Normal limits  Chest:Chest symmetric Cardio vascular:  S1/S2, RRR, No murmure, No Rubs or Gallops  pulmonary: Clear to auscultation bilaterally, respirations unlabored, negative wheezes / crackles Abdomen: Soft, non-tender, non-distended, bowel sounds,no masses, no organomegaly Muscular skeletal: Limited exam - in bed, able to move all 4 extremities, Normal strength,  Neuro: CNII-XII intact. , normal motor and sensation, reflexes intact  Extremities: No pitting edema lower extremities, +2 pulses  Skin: Dry, warm to touch, negative for any Rashes, No open wounds Wounds: per nursing documentation    ------------------------------------------------------------------------------------------------------------------------------------------    LABs:  CBC Latest Ref Rng & Units 03/31/2020 10/09/2019 12/31/2018  WBC 4.0 - 10.5 K/uL 8.6 6.1 6.8  Hemoglobin 13.0 - 17.0 g/dL 14.4 14.0 14.3  Hematocrit 39.0 - 52.0 % 44.2 43.0 43.2  Platelets 150 - 400 K/uL 317 270 282   CMP Latest Ref Rng & Units 03/31/2020 10/09/2019 12/31/2018  Glucose 70 - 99 mg/dL 126(H) 105(H) 99  BUN 8 - 23 mg/dL 13 13 13   Creatinine 0.61 - 1.24 mg/dL 0.98 1.01 1.05  Sodium 135 - 145 mmol/L 135 138 139  Potassium 3.5 - 5.1 mmol/L 3.6 4.3 4.5  Chloride 98 - 111 mmol/L 97(L) 102 103  CO2 22 - 32 mmol/L 25 26 29   Calcium 8.9 - 10.3 mg/dL 9.2 9.3 9.5  Total Protein 6.5 - 8.1 g/dL 7.7 6.9 6.8  Total Bilirubin 0.3 - 1.2 mg/dL 0.6 0.6 0.4  Alkaline Phos 38 - 126 U/L 71 - -  AST 15 - 41 U/L 23 18 19   ALT 0 - 44 U/L 28 24 21        Micro Results Recent Results (from the past 240 hour(s))  SARS Coronavirus 2 by  RT PCR (hospital order, performed in [facility] hospital lab) Nasopharyngeal Nasopharyngeal Swab     Status: None   Collection Time: 04/01/20  1:42 AM   Specimen: Nasopharyngeal Swab  Result Value Ref Range Status   SARS Coronavirus 2 NEGATIVE NEGATIVE Final    Comment: (NOTE) SARS-CoV-2  target nucleic acids are NOT DETECTED. The SARS-CoV-2 RNA is generally detectable in upper and lower respiratory specimens during the acute phase of infection. The lowest concentration of SARS-CoV-2 viral copies this assay can detect is 250 copies / mL. A negative result does not preclude SARS-CoV-2 infection and should not be used as the sole basis for treatment or other patient management decisions.  A negative result may occur with improper specimen collection / handling, submission of specimen other than nasopharyngeal swab, presence of viral mutation(s) within the areas targeted by this assay, and inadequate number of viral copies (<250 copies / mL). A negative result must be combined with clinical observations, patient history, and epidemiological information. Fact Sheet for Patients:   StrictlyIdeas.no Fact Sheet for Healthcare Providers: BankingDealers.co.za This test is not yet approved or cleared  by the [place] FDA and has been authorized for detection and/or diagnosis of SARS-CoV-2 by FDA under an Emergency Use Authorization (EUA).  This EUA will remain in effect (meaning this test can be used) for the duration of the COVID-19 declaration under Section 564(b)(1) of the Act, 21 U.S.C. section 360bbb-3(b)(1), unless the authorization is terminated or revoked sooner. Performed at [facility], [address], [place], [place] [address]     Radiology Reports DG Chest 2 View  Result Date: 03/31/2020 CLINICAL DATA:  Chest pain EXAM: CHEST - 2 VIEW COMPARISON:  10/29/2015 FINDINGS: The heart size and mediastinal contours are within normal  limits. Both lungs are clear. Small calcified lung nodule at the right base. The visualized skeletal structures are unremarkable. IMPRESSION: No active cardiopulmonary disease. Electronically Signed   By: Donavan Foil M.D.   On: 03/31/2020 22:25   CT Chest W Contrast  Result Date: 04/01/2020 CLINICAL DATA:  Epigastric and mid chest pain EXAM: CT CHEST, ABDOMEN, AND PELVIS WITH CONTRAST TECHNIQUE: Multidetector CT imaging of the chest, abdomen and pelvis was performed following the standard protocol during bolus administration of intravenous contrast. CONTRAST:  125mL OMNIPAQUE IOHEXOL 300 MG/ML  SOLN COMPARISON:  Chest radiograph 03/31/2020, CT 06/05/2011 FINDINGS: CT CHEST FINDINGS Cardiovascular: Normal heart size. No pericardial effusion. Calcifications seen left coronary artery. The aorta is normal caliber. Normal 3 vessel branching of the aortic arch. Proximal great vessels are unremarkable. Central pulmonary arteries are normal caliber. No large central or lobar filling defects on this non tailored examination pulmonary arteries. Mediastinum/Nodes: Normal thyroid gland and thoracic inlet. No acute abnormality of the trachea. Mild distal esophageal thickening without paraesophageal, fluid or gas. Few calcified mediastinal and hilar nodes are present, likely sequela of remote granulomatous disease. No worrisome mediastinal, hilar or axillary adenopathy. Lungs/Pleura: Few calcified granulomata in the lungs (3/100). No suspicious pulmonary nodules or masses. No consolidation, features of edema, pneumothorax, or effusion. Musculoskeletal: Benign-appearing sclerotic focus in the proximal right humerus, partially collimated from cross-sectional imaging. No acute osseous abnormality or suspicious osseous lesion. No suspicious chest wall abnormalities. CT ABDOMEN PELVIS FINDINGS Hepatobiliary: No worrisome focal liver lesions. Smooth liver surface contour. Normal hepatic attenuation. Calcified gallstone seen  within the proximal body of the gallbladder. No pericholecystic fluid or inflammation. No intraductal gallstones or biliary dilatation. Pancreas: Unremarkable. No pancreatic ductal dilatation or surrounding inflammatory changes. Spleen: Normal in size without focal abnormality. Adrenals/Urinary Tract: Normal adrenal glands. Kidneys are normally located with symmetric enhancement and excretion. Small focus of scarring in the posterior left upper pole. No suspicious renal lesion, urolithiasis or hydronephrosis. Urinary bladder is unremarkable aside from mild indentation of the bladder base by the prostate.  Stomach/Bowel: Distal esophageal thickening, as above. Stomach and duodenum are unremarkable. No small bowel thickening or dilatation. A normal appendix is visualized. No proximal colonic dilatation or wall thickening. There are scattered distal colonic diverticula with more long segmental thickening of the sigmoid colon but without associated inflammation at this time. Vascular/Lymphatic: Minimal plaque in the distal aorta and iliac arteries. No other significant vascular findings. No suspicious or enlarged lymph nodes in the included lymphatic chains. Reproductive: Prostate at the upper limits of normal for size. Coarse eccentric calcification of the prostate. No concerning abnormalities of the prostate or seminal vesicles. Other: No abdominopelvic free fluid or free gas. No bowel containing hernias. Small fat containing left inguinal hernia. Musculoskeletal: No acute osseous abnormality or suspicious osseous lesion. Stable bone island in the left pubic body. Multilevel degenerative changes are present in the imaged portions of the spine. IMPRESSION: 1. Mild distal esophageal thickening without paraesophageal, fluid or gas. Findings may represent esophagitis, correlate with symptoms and consider direct visualization. 2. Scattered distal colonic diverticula with segmental thickening of the sigmoid without active  inflammation at this time, may reflect sequela of prior diverticular inflammation though should consider correlation with prior colonoscopy if recently performed or direct visualization on an outpatient basis. 3. No other acute findings in the chest, abdomen or pelvis. 4. Cholelithiasis without CT evidence of acute cholecystitis. 5. Aortic Atherosclerosis (ICD10-I70.0). 6. Coronary artery calcifications are present. Please note that the presence of coronary artery calcium documents the presence of coronary artery disease, the severity of this disease and any potential stenosis cannot be assessed on this non-gated CT examination. Assessment for potential risk factor modification, dietary therapy or pharmacologic therapy may be warranted. Electronically Signed   By: Lovena Le M.D.   On: 04/01/2020 01:13   CT ABDOMEN PELVIS W CONTRAST  Result Date: 04/01/2020 CLINICAL DATA:  Epigastric and mid chest pain EXAM: CT CHEST, ABDOMEN, AND PELVIS WITH CONTRAST TECHNIQUE: Multidetector CT imaging of the chest, abdomen and pelvis was performed following the standard protocol during bolus administration of intravenous contrast. CONTRAST:  154mL OMNIPAQUE IOHEXOL 300 MG/ML  SOLN COMPARISON:  Chest radiograph 03/31/2020, CT 06/05/2011 FINDINGS: CT CHEST FINDINGS Cardiovascular: Normal heart size. No pericardial effusion. Calcifications seen left coronary artery. The aorta is normal caliber. Normal 3 vessel branching of the aortic arch. Proximal great vessels are unremarkable. Central pulmonary arteries are normal caliber. No large central or lobar filling defects on this non tailored examination pulmonary arteries. Mediastinum/Nodes: Normal thyroid gland and thoracic inlet. No acute abnormality of the trachea. Mild distal esophageal thickening without paraesophageal, fluid or gas. Few calcified mediastinal and hilar nodes are present, likely sequela of remote granulomatous disease. No worrisome mediastinal, hilar or  axillary adenopathy. Lungs/Pleura: Few calcified granulomata in the lungs (3/100). No suspicious pulmonary nodules or masses. No consolidation, features of edema, pneumothorax, or effusion. Musculoskeletal: Benign-appearing sclerotic focus in the proximal right humerus, partially collimated from cross-sectional imaging. No acute osseous abnormality or suspicious osseous lesion. No suspicious chest wall abnormalities. CT ABDOMEN PELVIS FINDINGS Hepatobiliary: No worrisome focal liver lesions. Smooth liver surface contour. Normal hepatic attenuation. Calcified gallstone seen within the proximal body of the gallbladder. No pericholecystic fluid or inflammation. No intraductal gallstones or biliary dilatation. Pancreas: Unremarkable. No pancreatic ductal dilatation or surrounding inflammatory changes. Spleen: Normal in size without focal abnormality. Adrenals/Urinary Tract: Normal adrenal glands. Kidneys are normally located with symmetric enhancement and excretion. Small focus of scarring in the posterior left upper pole. No suspicious renal lesion, urolithiasis or  hydronephrosis. Urinary bladder is unremarkable aside from mild indentation of the bladder base by the prostate. Stomach/Bowel: Distal esophageal thickening, as above. Stomach and duodenum are unremarkable. No small bowel thickening or dilatation. A normal appendix is visualized. No proximal colonic dilatation or wall thickening. There are scattered distal colonic diverticula with more long segmental thickening of the sigmoid colon but without associated inflammation at this time. Vascular/Lymphatic: Minimal plaque in the distal aorta and iliac arteries. No other significant vascular findings. No suspicious or enlarged lymph nodes in the included lymphatic chains. Reproductive: Prostate at the upper limits of normal for size. Coarse eccentric calcification of the prostate. No concerning abnormalities of the prostate or seminal vesicles. Other: No  abdominopelvic free fluid or free gas. No bowel containing hernias. Small fat containing left inguinal hernia. Musculoskeletal: No acute osseous abnormality or suspicious osseous lesion. Stable bone island in the left pubic body. Multilevel degenerative changes are present in the imaged portions of the spine. IMPRESSION: 1. Mild distal esophageal thickening without paraesophageal, fluid or gas. Findings may represent esophagitis, correlate with symptoms and consider direct visualization. 2. Scattered distal colonic diverticula with segmental thickening of the sigmoid without active inflammation at this time, may reflect sequela of prior diverticular inflammation though should consider correlation with prior colonoscopy if recently performed or direct visualization on an outpatient basis. 3. No other acute findings in the chest, abdomen or pelvis. 4. Cholelithiasis without CT evidence of acute cholecystitis. 5. Aortic Atherosclerosis (ICD10-I70.0). 6. Coronary artery calcifications are present. Please note that the presence of coronary artery calcium documents the presence of coronary artery disease, the severity of this disease and any potential stenosis cannot be assessed on this non-gated CT examination. Assessment for potential risk factor modification, dietary therapy or pharmacologic therapy may be warranted. Electronically Signed   By: Lovena Le M.D.   On: 04/01/2020 01:13    SIGNED: Deatra James, MD, FACP, FHM. [facility],  Pager (please use [email or web address] to page/text)  If 7PM-7AM, please contact night-coverage [email or web address] Ota [facility] 04/01/2020, 11:47 AM

## 2020-04-05 ENCOUNTER — Telehealth: Payer: Self-pay

## 2020-04-05 NOTE — Telephone Encounter (Signed)
Called [facility] to give them this information. Pharmacist stated the pt came in, she tried to explain to them that the insurance wouldn't cover it and they picked up the paper rx and said they were going to take it to another pharmacy. I tried to call the pts daughterBasilia Gomez who is on his release of information [phone])- NA, LMOM to return call or to have their new pharmacy call me.

## 2020-04-05 NOTE — Telephone Encounter (Signed)
Okay with tablet but patient needs to make it into a slurry and take it in a liquid form.  Pharmacist can instruct.

## 2020-04-05 NOTE — Telephone Encounter (Signed)
Received notification that Carafate 1gm/78ml suspension is not covered by the pts insurance. I looked up his formulary with [employer] and it said the carafate 1gr tablets are covered. Is it ok to change rx to tablets?

## 2020-04-06 NOTE — Telephone Encounter (Signed)
Pt came by the office to make an appointment. I spoke with him about his medication. He said he took the rx to [facility] and was able to get it.

## 2020-04-07 NOTE — Discharge Summary (Signed)
Physician Discharge Summary [facility]    Patient: Luis Gomez [facility]                   Admit date: 03/31/2020   DOB: [birth year]             Discharge date:04/07/2020/2:24 PM OO:[id number]                          PCP: Sharilyn Sites, MD  Disposition: HOME   Recommendations for Outpatient Follow-up:   . Follow up: in 1 week  Discharge Condition: Stable   Code Status:   Code Status: Prior  Diet recommendation: Regular healthy diet   Discharge Diagnoses:    Principal Problem:   Chest pain Active Problems:   Human immunodeficiency virus (HIV) disease ([place])   GERD   Esophagitis   History of Present Illness/ Hospital Course Luis Gomez Summary:       Luis Gomez a64 y.o.hispanicmale,with history of GERD, hypertension, hyperlipidemia, diabetes mellitus type 2, HIV came to ED with complaints of chest pain with nausea vomiting.  Patient had 5-6 episodes of vomiting. Denies any black or bloody stools. He also had brief episode of chest pain which came on after eating. No previous cardiac history. Denies any abdominal pain. CT scan showed evidence of esophagitis as well as CAD. Initial troponin was negative. Patient's chest pain has improved after he received GI cocktail in the ED.   Assessment & Plan:      Chest pain   Atypical chest pain -likely GI origin from GERD. Improved after GI cocktail.  -Cardiac enzymes negative, EKG within normal limits Patient received Protonix in the ED. continue with Protonix 40 mg p.o.   -GI consulted for evaluation, recommending inpatient EGD due to his history of HIV, and presentation.  EGDFindings: K20.90, Esophagitis, unspecified without bleeding K44.9, Diaphragmatic hernia without obstruction or gangrene R93.3, Abnormal findings on diagnostic imaging of other parts of digestive tract  -Recommendations:    - Advance diet as tolerated. Begin Protonix 40 mg twice daily. Carafate suspension 1 g  before lunch, supper and at bedtime x 5 days?not to be taken around breakfast when he is taking [place] visit with [name] in 6 weeks  Initial troponin are  negative. ACS was ruled out -Recommending outpatient evaluation for cardiac stress test  HIV -last CD4 count was 946 in December 2020.  Followed by ID as outpatient.  Continue [name] antiretroviral therapy. -Reports that he is compliant with his medication, his blood work is monitored every 6 to 9 months    Nutritional status:  Cultures; None   Antimicrobials: None   Consultants: GI  EGD today       Discharge Instructions:      Medication List    ASK your doctor about these medications   aspirin EC 81 MG tablet Take 1 tablet (81 mg total) by mouth daily.   Fish Oil 1000 MG Caps Take 1,000 mg by mouth daily.   [name] 150-150-200-10 MG Tabs tablet Generic drug: elvitegravir-cobicistat-emtricitabine-tenofovir Take 1 tablet by mouth daily with breakfast.   sildenafil 20 MG tablet Commonly known as: REVATIO Take 10 mg by mouth daily as needed (for ED).   valACYclovir 500 MG tablet Commonly known as: VALTREX Take 1 tablet (500 mg total) by mouth 2 (two) times daily.       No Known Allergies   Procedures /Studies:   DG Chest 2 View  Result Date: 03/31/2020 CLINICAL DATA:  Chest pain EXAM: CHEST - 2 VIEW COMPARISON:  10/29/2015 FINDINGS: The heart size and mediastinal contours are within normal limits. Both lungs are clear. Small calcified lung nodule at the right base. The visualized skeletal structures are unremarkable. IMPRESSION: No active cardiopulmonary disease. Electronically Signed   By: Donavan Foil M.D.   On: 03/31/2020 22:25   CT Chest W Contrast  Result Date: 04/01/2020 CLINICAL DATA:  Epigastric and mid chest pain EXAM: CT CHEST, ABDOMEN, AND PELVIS WITH CONTRAST TECHNIQUE: Multidetector CT imaging of the chest, abdomen and pelvis was performed following the standard protocol  during bolus administration of intravenous contrast. CONTRAST:  139mL OMNIPAQUE IOHEXOL 300 MG/ML  SOLN COMPARISON:  Chest radiograph 03/31/2020, CT 06/05/2011 FINDINGS: CT CHEST FINDINGS Cardiovascular: Normal heart size. No pericardial effusion. Calcifications seen left coronary artery. The aorta is normal caliber. Normal 3 vessel branching of the aortic arch. Proximal great vessels are unremarkable. Central pulmonary arteries are normal caliber. No large central or lobar filling defects on this non tailored examination pulmonary arteries. Mediastinum/Nodes: Normal thyroid gland and thoracic inlet. No acute abnormality of the trachea. Mild distal esophageal thickening without paraesophageal, fluid or gas. Few calcified mediastinal and hilar nodes are present, likely sequela of remote granulomatous disease. No worrisome mediastinal, hilar or axillary adenopathy. Lungs/Pleura: Few calcified granulomata in the lungs (3/100). No suspicious pulmonary nodules or masses. No consolidation, features of edema, pneumothorax, or effusion. Musculoskeletal: Benign-appearing sclerotic focus in the proximal right humerus, partially collimated from cross-sectional imaging. No acute osseous abnormality or suspicious osseous lesion. No suspicious chest wall abnormalities. CT ABDOMEN PELVIS FINDINGS Hepatobiliary: No worrisome focal liver lesions. Smooth liver surface contour. Normal hepatic attenuation. Calcified gallstone seen within the proximal body of the gallbladder. No pericholecystic fluid or inflammation. No intraductal gallstones or biliary dilatation. Pancreas: Unremarkable. No pancreatic ductal dilatation or surrounding inflammatory changes. Spleen: Normal in size without focal abnormality. Adrenals/Urinary Tract: Normal adrenal glands. Kidneys are normally located with symmetric enhancement and excretion. Small focus of scarring in the posterior left upper pole. No suspicious renal lesion, urolithiasis or hydronephrosis.  Urinary bladder is unremarkable aside from mild indentation of the bladder base by the prostate. Stomach/Bowel: Distal esophageal thickening, as above. Stomach and duodenum are unremarkable. No small bowel thickening or dilatation. A normal appendix is visualized. No proximal colonic dilatation or wall thickening. There are scattered distal colonic diverticula with more long segmental thickening of the sigmoid colon but without associated inflammation at this time. Vascular/Lymphatic: Minimal plaque in the distal aorta and iliac arteries. No other significant vascular findings. No suspicious or enlarged lymph nodes in the included lymphatic chains. Reproductive: Prostate at the upper limits of normal for size. Coarse eccentric calcification of the prostate. No concerning abnormalities of the prostate or seminal vesicles. Other: No abdominopelvic free fluid or free gas. No bowel containing hernias. Small fat containing left inguinal hernia. Musculoskeletal: No acute osseous abnormality or suspicious osseous lesion. Stable bone island in the left pubic body. Multilevel degenerative changes are present in the imaged portions of the spine. IMPRESSION: 1. Mild distal esophageal thickening without paraesophageal, fluid or gas. Findings may represent esophagitis, correlate with symptoms and consider direct visualization. 2. Scattered distal colonic diverticula with segmental thickening of the sigmoid without active inflammation at this time, may reflect sequela of prior diverticular inflammation though should consider correlation with prior colonoscopy if recently performed or direct visualization on an outpatient basis. 3. No other acute findings in the chest, abdomen or pelvis. 4. Cholelithiasis without CT evidence of  acute cholecystitis. 5. Aortic Atherosclerosis (ICD10-I70.0). 6. Coronary artery calcifications are present. Please note that the presence of coronary artery calcium documents the presence of coronary  artery disease, the severity of this disease and any potential stenosis cannot be assessed on this non-gated CT examination. Assessment for potential risk factor modification, dietary therapy or pharmacologic therapy may be warranted. Electronically Signed   By: Lovena Le M.D.   On: 04/01/2020 01:13   CT ABDOMEN PELVIS W CONTRAST  Result Date: 04/01/2020 CLINICAL DATA:  Epigastric and mid chest pain EXAM: CT CHEST, ABDOMEN, AND PELVIS WITH CONTRAST TECHNIQUE: Multidetector CT imaging of the chest, abdomen and pelvis was performed following the standard protocol during bolus administration of intravenous contrast. CONTRAST:  151mL OMNIPAQUE IOHEXOL 300 MG/ML  SOLN COMPARISON:  Chest radiograph 03/31/2020, CT 06/05/2011 FINDINGS: CT CHEST FINDINGS Cardiovascular: Normal heart size. No pericardial effusion. Calcifications seen left coronary artery. The aorta is normal caliber. Normal 3 vessel branching of the aortic arch. Proximal great vessels are unremarkable. Central pulmonary arteries are normal caliber. No large central or lobar filling defects on this non tailored examination pulmonary arteries. Mediastinum/Nodes: Normal thyroid gland and thoracic inlet. No acute abnormality of the trachea. Mild distal esophageal thickening without paraesophageal, fluid or gas. Few calcified mediastinal and hilar nodes are present, likely sequela of remote granulomatous disease. No worrisome mediastinal, hilar or axillary adenopathy. Lungs/Pleura: Few calcified granulomata in the lungs (3/100). No suspicious pulmonary nodules or masses. No consolidation, features of edema, pneumothorax, or effusion. Musculoskeletal: Benign-appearing sclerotic focus in the proximal right humerus, partially collimated from cross-sectional imaging. No acute osseous abnormality or suspicious osseous lesion. No suspicious chest wall abnormalities. CT ABDOMEN PELVIS FINDINGS Hepatobiliary: No worrisome focal liver lesions. Smooth liver surface  contour. Normal hepatic attenuation. Calcified gallstone seen within the proximal body of the gallbladder. No pericholecystic fluid or inflammation. No intraductal gallstones or biliary dilatation. Pancreas: Unremarkable. No pancreatic ductal dilatation or surrounding inflammatory changes. Spleen: Normal in size without focal abnormality. Adrenals/Urinary Tract: Normal adrenal glands. Kidneys are normally located with symmetric enhancement and excretion. Small focus of scarring in the posterior left upper pole. No suspicious renal lesion, urolithiasis or hydronephrosis. Urinary bladder is unremarkable aside from mild indentation of the bladder base by the prostate. Stomach/Bowel: Distal esophageal thickening, as above. Stomach and duodenum are unremarkable. No small bowel thickening or dilatation. A normal appendix is visualized. No proximal colonic dilatation or wall thickening. There are scattered distal colonic diverticula with more long segmental thickening of the sigmoid colon but without associated inflammation at this time. Vascular/Lymphatic: Minimal plaque in the distal aorta and iliac arteries. No other significant vascular findings. No suspicious or enlarged lymph nodes in the included lymphatic chains. Reproductive: Prostate at the upper limits of normal for size. Coarse eccentric calcification of the prostate. No concerning abnormalities of the prostate or seminal vesicles. Other: No abdominopelvic free fluid or free gas. No bowel containing hernias. Small fat containing left inguinal hernia. Musculoskeletal: No acute osseous abnormality or suspicious osseous lesion. Stable bone island in the left pubic body. Multilevel degenerative changes are present in the imaged portions of the spine. IMPRESSION: 1. Mild distal esophageal thickening without paraesophageal, fluid or gas. Findings may represent esophagitis, correlate with symptoms and consider direct visualization. 2. Scattered distal colonic  diverticula with segmental thickening of the sigmoid without active inflammation at this time, may reflect sequela of prior diverticular inflammation though should consider correlation with prior colonoscopy if recently performed or direct visualization on an outpatient basis.  3. No other acute findings in the chest, abdomen or pelvis. 4. Cholelithiasis without CT evidence of acute cholecystitis. 5. Aortic Atherosclerosis (ICD10-I70.0). 6. Coronary artery calcifications are present. Please note that the presence of coronary artery calcium documents the presence of coronary artery disease, the severity of this disease and any potential stenosis cannot be assessed on this non-gated CT examination. Assessment for potential risk factor modification, dietary therapy or pharmacologic therapy may be warranted. Electronically Signed   By: Lovena Le M.D.   On: 04/01/2020 01:13   ECHOCARDIOGRAM COMPLETE  Result Date: 04/01/2020    ECHOCARDIOGRAM REPORT   Patient Name:   JCION PROHASKA Date of Exam: 04/01/2020 Medical Rec #:  [id number]             Height:       66.0 in Accession #:    [id number]            Weight:       190.0 lb Date of Birth:  1956-08-10             BSA:          1.957 m Patient Age:    89 years              BP:           130/83 mmHg Patient Gender: M                     HR:           72 bpm. Exam Location:  [facility] Procedure: 2D Echo Indications:    Chest Pain 786.50 / R07.9  History:        Patient has no prior history of Echocardiogram examinations.                 Signs/Symptoms:Chest Pain; Risk Factors:Non-Smoker. HIV, GERD.  Sonographer:    Leavy Cella RDCS (AE) Referring Phys: [phone] Catalena Stanhope A [place] Pulse IMPRESSIONS  1. Left ventricular ejection fraction, by estimation, is 55 to 60%. The left ventricle has normal function. The left ventricle has no regional wall motion abnormalities. There is mild concentric left ventricular hypertrophy. Left ventricular diastolic parameters are  consistent with Grade I diastolic dysfunction (impaired relaxation).  2. Right ventricular systolic function is normal. The right ventricular size is normal.  3. The mitral valve is grossly normal. No evidence of mitral valve regurgitation.  4. The aortic valve is tricuspid. Aortic valve regurgitation is mild. No aortic stenosis is present.  5. The inferior vena cava is normal in size with greater than 50% respiratory variability, suggesting right atrial pressure of 3 mmHg. FINDINGS  Left Ventricle: Left ventricular ejection fraction, by estimation, is 55 to 60%. The left ventricle has normal function. The left ventricle has no regional wall motion abnormalities. The left ventricular internal cavity size was normal in size. There is  mild concentric left ventricular hypertrophy. Left ventricular diastolic parameters are consistent with Grade I diastolic dysfunction (impaired relaxation). Indeterminate filling pressures. Right Ventricle: The right ventricular size is normal. No increase in right ventricular wall thickness. Right ventricular systolic function is normal. Left Atrium: Left atrial size was normal in size. Right Atrium: Right atrial size was normal in size. Pericardium: There is no evidence of pericardial effusion. Mitral Valve: The mitral valve is grossly normal. No evidence of mitral valve regurgitation. Tricuspid Valve: The tricuspid valve is grossly normal. Tricuspid valve regurgitation is mild. Aortic Valve: The aortic valve is tricuspid. Aortic valve  regurgitation is mild. Aortic regurgitation PHT measures 522 msec. No aortic stenosis is present. Pulmonic Valve: The pulmonic valve was normal in structure. Pulmonic valve regurgitation is not visualized. Aorta: The aortic root is normal in size and structure. Venous: The inferior vena cava is normal in size with greater than 50% respiratory variability, suggesting right atrial pressure of 3 mmHg. IAS/Shunts: The interatrial septum was not well  visualized.  LEFT VENTRICLE PLAX 2D LVIDd:         4.88 cm  Diastology LVIDs:         3.39 cm  LV e' lateral:   9.14 cm/s LV PW:         1.38 cm  LV E/e' lateral: 7.3 LV IVS:        1.24 cm  LV e' medial:    5.55 cm/s LVOT diam:     2.10 cm  LV E/e' medial:  12.1 LVOT Area:     3.46 cm  RIGHT VENTRICLE RV S prime:     12.80 cm/s TAPSE (M-mode): 2.8 cm LEFT ATRIUM             Index       RIGHT ATRIUM           Index LA diam:        4.30 cm 2.20 cm/m  RA Area:     12.10 cm LA Vol (A2C):   31.2 ml 15.95 ml/m RA Volume:   29.60 ml  15.13 ml/m LA Vol (A4C):   52.9 ml 27.04 ml/m LA Biplane Vol: 43.5 ml 22.23 ml/m  AORTIC VALVE AI PHT:      522 msec  AORTA Ao Root diam: 3.20 cm MITRAL VALVE MV Area (PHT): 3.77 cm    SHUNTS MV Decel Time: 201 msec    Systemic Diam: 2.10 cm MV E velocity: 66.90 cm/s MV A velocity: 88.30 cm/s MV E/A ratio:  0.76 Kate Sable MD Electronically signed by Kate Sable MD Signature Date/Time: 04/01/2020/12:54:28 PM    Final      Subjective:   Patient was seen and examined 04/07/2020, 2:24 PM Patient stable today. No acute distress.  No issues overnight Stable for discharge.  Discharge Exam:    Vitals:   04/01/20 1325 04/01/20 1330 04/01/20 1335 04/01/20 1355  BP: 125/67 (!) 106/51 105/73 105/73  Pulse: 76 64 68 70  Resp: (!) 23 19 18 20   Temp:    97.9 F (36.6 C)  TempSrc:    Oral  SpO2: 99% 98% 96% 96%  Weight:      Height:        General: Pt lying comfortably in bed & appears in no obvious distress. Cardiovascular: S1 & S2 heard, RRR, S1/S2 +. No murmurs, rubs, gallops or clicks. No JVD or pedal edema. Respiratory: Clear to auscultation without wheezing, rhonchi or crackles. No increased work of breathing. Abdominal:  Non-distended, non-tender & soft. No organomegaly or masses appreciated. Normal bowel sounds heard. CNS: Alert and oriented. No focal deficits. Extremities: no edema, no cyanosis    The results of significant diagnostics from this  hospitalization (including imaging, microbiology, ancillary and laboratory) are listed below for reference.      Microbiology:   Recent Results (from the past 240 hour(s))  SARS Coronavirus 2 by RT PCR (hospital order, performed in [facility] hospital lab) Nasopharyngeal Nasopharyngeal Swab     Status: None   Collection Time: 04/01/20  1:42 AM   Specimen: Nasopharyngeal Swab  Result Value Ref Range  Status   SARS Coronavirus 2 NEGATIVE NEGATIVE Final    Comment: (NOTE) SARS-CoV-2 target nucleic acids are NOT DETECTED. The SARS-CoV-2 RNA is generally detectable in upper and lower respiratory specimens during the acute phase of infection. The lowest concentration of SARS-CoV-2 viral copies this assay can detect is 250 copies / mL. A negative result does not preclude SARS-CoV-2 infection and should not be used as the sole basis for treatment or other patient management decisions.  A negative result may occur with improper specimen collection / handling, submission of specimen other than nasopharyngeal swab, presence of viral mutation(s) within the areas targeted by this assay, and inadequate number of viral copies (<250 copies / mL). A negative result must be combined with clinical observations, patient history, and epidemiological information. Fact Sheet for Patients:   StrictlyIdeas.no Fact Sheet for Healthcare Providers: BankingDealers.co.za This test is not yet approved or cleared  by the [place] FDA and has been authorized for detection and/or diagnosis of SARS-CoV-2 by FDA under an Emergency Use Authorization (EUA).  This EUA will remain in effect (meaning this test can be used) for the duration of the COVID-19 declaration under Section 564(b)(1) of the Act, 21 U.S.C. section 360bbb-3(b)(1), unless the authorization is terminated or revoked sooner. Performed at [facility], [address], [place], [place] [address]       Labs:   CBC: Recent Labs  Lab 03/31/20 2225  WBC 8.6  HGB 14.4  HCT 44.2  MCV 86.3  PLT [id number]   Basic Metabolic Panel: Recent Labs  Lab 03/31/20 2225  NA 135  K 3.6  CL 97*  CO2 25  GLUCOSE 126*  BUN 13  CREATININE 0.98  CALCIUM 9.2   Liver Function Tests: Recent Labs  Lab 03/31/20 2225  AST 23  ALT 28  ALKPHOS 71  BILITOT 0.6  PROT 7.7  ALBUMIN 4.6   BNP (last 3 results) No results for input(s): BNP in the last 8760 hours. Cardiac Enzymes: No results for input(s): CKTOTAL, CKMB, CKMBINDEX, TROPONINI in the last 168 hours. CBG: No results for input(s): GLUCAP in the last 168 hours. Hgb A1c No results for input(s): HGBA1C in the last 72 hours. Lipid Profile No results for input(s): CHOL, HDL, LDLCALC, TRIG, CHOLHDL, LDLDIRECT in the last 72 hours. Thyroid function studies No results for input(s): TSH, T4TOTAL, T3FREE, THYROIDAB in the last 72 hours.  Invalid input(s): FREET3 Anemia work up No results for input(s): VITAMINB12, FOLATE, FERRITIN, TIBC, IRON, RETICCTPCT in the last 72 hours. Urinalysis    Component Value Date/Time   COLORURINE YELLOW 06/12/2012 1200   APPEARANCEUR CLOUDY (A) 06/12/2012 1200   LABSPEC 1.013 06/12/2012 1200   PHURINE 8.0 06/12/2012 1200   GLUCOSEU NEG 06/12/2012 1200   HGBUR NEG 06/12/2012 1200   HGBUR trace-intact 08/27/2008 1011   BILIRUBINUR NEG 06/12/2012 1200   BILIRUBINUR NEG 02/22/2011 1154   KETONESUR NEG 06/12/2012 1200   PROTEINUR NEG 06/12/2012 1200   UROBILINOGEN 0.2 06/12/2012 1200   NITRITE NEG 06/12/2012 1200   LEUKOCYTESUR NEG 06/12/2012 1200         Time coordinating discharge: Over 45 minutes  SIGNED: Deatra James, MD, FACP, FHM. [facility],  Please use [email or web address] to Page If 7PM-7AM, please contact night-coverage [email or web address] Ota [facility] 04/07/2020, 2:24 PM

## 2020-04-25 MED FILL — GENVOYA TABLET: 150-150-200 | 30 days supply | Qty: 30 | Fill #3

## 2020-05-12 DIAGNOSIS — M5136 Other intervertebral disc degeneration, lumbar region: Secondary | ICD-10-CM | POA: Diagnosis not present

## 2020-05-26 DIAGNOSIS — M545 Low back pain: Secondary | ICD-10-CM | POA: Diagnosis not present

## 2020-05-26 DIAGNOSIS — M79662 Pain in left lower leg: Secondary | ICD-10-CM | POA: Diagnosis not present

## 2020-05-30 ENCOUNTER — Other Ambulatory Visit: Payer: Self-pay | Admitting: Infectious Diseases

## 2020-05-30 DIAGNOSIS — B20 Human immunodeficiency virus [HIV] disease: Secondary | ICD-10-CM

## 2020-05-30 MED FILL — GENVOYA TABLET: 150-150-200 | 30 days supply | Qty: 30 | Fill #0

## 2020-06-02 ENCOUNTER — Encounter: Payer: Self-pay | Admitting: Gastroenterology

## 2020-06-02 NOTE — Progress Notes (Signed)
Primary Care Physician: Sharilyn Sites, MD  Primary Gastroenterologist:  Garfield Cornea, MD   Chief Complaint  Patient presents with  . Hospitalization Follow-up    doing ok    HPI: Luis Gomez is a 64 y.o. male here for hospital follow-up.  Formal interpreter present today.  Seen in the hospital back in May.  Presented with chest pain and vomiting.  CT showed evidence of esophagitis.  Patient underwent EGD was noted to have severe erosive/ulcerative reflux esophagitis extending up 8 cm from the GE junction.  He had a noncritical Schatzki ring not manipulated.  Medium sized hiatal hernia.  He was started on pantoprazole 40 mg twice daily.  Carafate slurry's before lunch dinner and at bedtime but not to be taken at breakfast time of his HIV medication.  Patient back today stating that he is doing well.  100% better.  Denies any chest pain, dysphagia, heartburn, abdominal pain.  Bowel movements regular.  No blood in stool or melena.  At this time he is not on Carafate.  He is taking pantoprazole 40 mg twice daily.  States he is gained about 10 pounds since he was sick.    Current Outpatient Medications  Medication Sig Dispense Refill  . gabapentin (NEURONTIN) 300 MG capsule Take 1-2 capsules by mouth 2 (two) times daily. Take 1 capsule in morning and 2 capsules at night    . [name] 150-150-200-10 MG TABS tablet TAKE 1 TABLET BY MOUTH DAILY WITH BREAKFAST. 30 tablet 3  . pantoprazole (PROTONIX) 40 MG tablet Take 40 mg by mouth 2 (two) times daily.    . sildenafil (REVATIO) 20 MG tablet Take 10 mg by mouth daily as needed (for ED).      No current facility-administered medications for this visit.    Allergies as of 06/03/2020  . (No Known Allergies)    ROS:  General: Negative for anorexia, weight loss, fever, chills, fatigue, weakness. ENT: Negative for hoarseness, difficulty swallowing , nasal congestion. CV: Negative for chest pain, angina, palpitations, dyspnea on  exertion, peripheral edema.  Respiratory: Negative for dyspnea at rest, dyspnea on exertion, cough, sputum, wheezing.  GI: See history of present illness. GU:  Negative for dysuria, hematuria, urinary incontinence, urinary frequency, nocturnal urination.  Endo: Negative for unusual weight change.    Physical Examination:   BP (!) 136/77   Pulse 91   Temp 97.7 F (36.5 C) (Temporal)   Ht 5\' 6"  (1.676 m)   Wt 199 lb 12.8 oz (90.6 kg)   BMI 32.25 kg/m   General: Well-nourished, well-developed in no acute distress.  Eyes: No icterus. Mouth: masked Lungs: Clear to auscultation bilaterally.  Heart: Regular rate and rhythm, no murmurs rubs or gallops.  Abdomen: Bowel sounds are normal, nontender, nondistended, no hepatosplenomegaly or masses, no abdominal bruits or hernia , no rebound or guarding.   Extremities: No lower extremity edema. No clubbing or deformities. Neuro: Alert and oriented x 4   Skin: Warm and dry, no jaundice.   Psych: Alert and cooperative, normal mood and affect.  Labs:  Lab Results  Component Value Date   CREATININE 0.98 03/31/2020   BUN 13 03/31/2020   NA 135 03/31/2020   K 3.6 03/31/2020   CL 97 (L) 03/31/2020   CO2 25 03/31/2020   Lab Results  Component Value Date   ALT 28 03/31/2020   AST 23 03/31/2020   ALKPHOS 71 03/31/2020   BILITOT 0.6 03/31/2020   Lab Results  Component Value Date   WBC 8.6 03/31/2020   HGB 14.4 03/31/2020   HCT 44.2 03/31/2020   MCV 86.3 03/31/2020   PLT 317 03/31/2020     Imaging Studies: No results found.  Impression/plan:  63 year old Hispanic male hospitalized 2 months ago for chest pain and vomiting.  CT with evidence of esophagitis.  EGD showed severe erosive/ulcerative reflux esophagitis and a moderate hiatal hernia.  He has been on pantoprazole twice daily for 8 weeks.  He is doing very well.  He will complete his current bottle at twice daily and then transition to once daily dosing if tolerated.  He is  aware to let us know if he has any recurrent heartburn, chest pain, vomiting or any problems swallowing.  At that point would consider going back to twice daily dosing.  Reinforced antireflux measures.  Handouts in Spanish provided.  Patient will return to the office in 6 months or call sooner if needed.

## 2020-06-03 ENCOUNTER — Encounter: Payer: Self-pay | Admitting: Gastroenterology

## 2020-06-03 ENCOUNTER — Other Ambulatory Visit: Payer: Self-pay

## 2020-06-03 ENCOUNTER — Ambulatory Visit (INDEPENDENT_AMBULATORY_CARE_PROVIDER_SITE_OTHER): Payer: BC Managed Care – PPO | Admitting: Gastroenterology

## 2020-06-03 VITALS — BP 136/77 | HR 91 | Temp 97.7°F | Ht 66.0 in | Wt 199.8 lb

## 2020-06-03 DIAGNOSIS — K209 Esophagitis, unspecified without bleeding: Secondary | ICD-10-CM | POA: Diagnosis not present

## 2020-06-03 MED ORDER — PANTOPRAZOLE SODIUM 40 MG PO TBEC: 40.0000 mg | DELAYED_RELEASE_TABLET | Freq: Every day | ORAL | 3 refills | Status: DC

## 2020-06-03 NOTE — Patient Instructions (Addendum)
1. Please complete your current bottle of pantoprazole, taking two a day. Once you have finished, you will drop dose to once daily.  2. I have sent in new RX for once daily pantoprazole to [facility]. Please let us know if you have any problems getting the medication.  3. Return to the office in six months but call sooner if you are having any chest pain, heartburn, problems swallowing.    1. Complete su frasco actual de pantoprazol, [employer]. Una vez que haya terminado, bajar la dosis a Child psychotherapist al [employer].  2. He enviado un nuevo RX para pantoprazol una [place] a [employer]. Hganos saber si tiene algn problema para obtener el medicamento.  3. Regrese al consultorio en seis meses, pero llame antes si tiene dolor en el pecho, acidez o problemas para tragar.    Enfermedad de reflujo gastroesofgico en los adultos Gastroesophageal Reflux Disease, Adult El reflujo gastroesofgico (RGE) ocurre cuando el cido del estmago sube por el tubo que conecta la boca con el estmago (esfago). Normalmente, la comida baja por el esfago y se mantiene en el estmago, donde se la digiere. Sin embargo, cuando una persona tiene [place], los alimentos y el cido estomacal suelen volver al esfago. Si esto se vuelve un problema ms grave, a la persona se le puede diagnosticar una enfermedad llamada enfermedad de reflujo gastroesofgico (ERGE). La ERGE ocurre cuando el reflujo:  Sucede a menudo.  Causa sntomas frecuentes o graves.  Causa problemas tales como dao en el esfago. Cuando el cido del Insurance claims handler en contacto con el esfago, el cido puede provocar dolor (inflamacin) en el esfago. Con el tiempo, pueden formarse pequeos agujeros (lceras) en el revestimiento del esfago. Cules son las causas? Esta afeccin se debe a un problema en el msculo que se encuentra entre el esfago y Product manager (esfnter esofgico inferior, o EEI). Normalmente, el EEI se cierra una vez que la comida pasa a  travs del esfago hasta el [place]. Cuando el EEI se encuentra debilitado o tiene alguna anomala, no se cierra por completo, y eso permite que tanto la comida como el jugo gstrico, que es cido, [place] a subir por el esfago. El EEI puede debilitarse a causa de ciertas sustancias alimenticias, medicamentos y Product/process development scientist, que incluyen:  El consumo de [place].  [place].  Tener una hernia de hiato.  Consumo de alcohol.  Ciertos alimentos y bebidas, como caf, chocolate, cebollas y [place]. Qu incrementa el riesgo? Es ms probable que tenga esta afeccin si:  Tiene un aumento del Engineer, site.  Tiene un trastorno del tejido conjuntivo.  [place] antiinflamatorios no esteroideos (AINE). Cules son los signos o los sntomas? Los sntomas de esta afeccin incluyen:  Acidez estomacal.  Dificultad o dolor al tragar.  Sensacin de Best boy un bulto en la garganta.  Sabor amargo en la boca.  Mal aliento.  Gran cantidad de saliva.  Estmago inflamado o con Tree surgeon.  Eructos.  Dolor en el pecho. El dolor de pecho puede deberse a distintas afecciones. Es importante que consulte al mdico si tiene dolor de [place].  Dificultad para respirar o sibilancias.  Tos constante (crnica) o tos nocturna.  Desgaste del Paramedic.  Prdida de peso. Cmo se diagnostica? El mdico le har una historia clnica y un examen fsico. Para determinar si tiene ERGE leve o grave, el mdico tambin puede controlar cmo usted reacciona al tratamiento. Tambin pueden [employer], que incluyen los siguientes:  Un estudio para examinarle el [place]  y el esfago con Ardelia Mems cmara pequea (endoscopa).  Una prueba para medir el grado de Musician.  Una prueba para medir cunta presin hay en el esfago.  Un estudio de deglucin con bario comn o modificado para ver la forma, el tamao y el funcionamiento del esfago. Cmo se trata? El [place] del tratamiento es ayudar a Public house manager los  sntomas y Product/process development scientist las complicaciones. El tratamiento de esta afeccin puede variar segn la gravedad de los sntomas. El mdico puede recomendarle lo siguiente:  Cambios en la dieta.  Medicamentos.  Clementeen Hoof. Siga estas indicaciones en su casa: Comida y bebida   Siga la dieta recomendada por el mdico. Esto puede incluir evitar ciertos alimentos y bebidas, por ejemplo: ? Caf y t (con o sin cafena). ? Bebidas que contengan alcohol. ? Bebidas energticas y deportivas. ? Bebidas gaseosas o refrescos. ? Chocolate y cacao. ? Menta y [place]. ? Ajo y cebolla. ? Rbano picante. ? Alimentos condimentados, picantes y cidos, por ejemplo, todos los tipos de pimientas, [place] en polvo, curry en polvo, vinagre, salsas picantes y [employer]. ? Ctricos y sus jugos, por ejemplo, naranjas, limones y limas. ? Alimentos a base de tomate, como salsa de [place], [place], salsa picante y pizza con salsa de [place]. ? Alimentos fritos y [place], [place] donas, papas fritas y aderezos ricos en grasas. ? Carnes con alto contenido de grasa, como salchichas, y cortes de carnes rojas y blancas con mucha grasa, por ejemplo, chuletas o costillas, embutidos, jamn y tocino. ? Productos lcteos ricos en grasas, como leche [place], [place] y [place] crema.  Haga comidas pequeas y frecuentes Medical sales representative de comidas abundantes.  Evite beber grandes cantidades de lquidos con las comidas.  Evite comer 2 o 3horas antes de acostarse.  Evite recostarse inmediatamente despus de comer.  No haga ejercicios enseguida despus de comer. Estilo de vida   No consuma ningn producto que contenga nicotina o tabaco, como cigarrillos, cigarrillos electrnicos y tabaco de Higher education careers adviser. Si necesita ayuda para dejar de fumar, consulte al [employer].  Trate de reducir el estrs con mtodos como el yoga o la meditacin. Si necesita ayuda para reducir [employer] de estrs, consulte al mdico.  Si tiene sobrepeso, baje  hasta llegar a un peso saludable para usted. Pdale consejos al mdico para bajar de peso de [place] segura. Indicaciones generales  Est atento a cualquier cambio en los sntomas.  Tome los medicamentos de venta libre y los recetados solamente como se lo haya indicado el mdico. No tome aspirina, ibuprofeno ni otros AINE a menos que el mdico se lo indique.  Use ropa holgada. No use nada apretado alrededor de la cintura que haga presin sobre el abdomen.  Levante (eleve) la cabecera de la cama aproximadamente 6pulgadas (15cm).  Evite inclinarse si al hacerlo empeoran los sntomas.  Concurra a todas las visitas de seguimiento como se lo haya indicado el mdico. Esto es importante. Comunquese con un mdico si:  Tiene los siguientes sntomas: ? Sntomas nuevos. ? Prdida de peso sin causa aparente. ? Dificultad o dolor al tragar. ? Sibilancias o una tos persistente. ? Voz ronca.  Los sntomas no mejoran con Dispensing optician. Solicite ayuda inmediatamente si:  [employer], el cuello, la [place], los dientes o la espalda.  Se siente transpirado, mareado o tiene una sensacin de desvanecimiento.  Siente dolor intenso en el pecho o le falta el aire.  Vomita y el vmito tiene un  aspecto similar a la sangre o a los posos de caf.  Se desmaya.  Tiene heces sanguinolentas o negras.  No puede tragar, beber o comer. Resumen  El reflujo gastroesofgico ocurre cuando el cido del estmago sube al esfago. La ERGE es una enfermedad en la que el reflujo ocurre con frecuencia, causa sntomas frecuentes o graves, o causa problemas tales como dao en el esfago.  El tratamiento de esta afeccin puede variar segn la gravedad de los sntomas. El mdico puede indicarle que siga una dieta y haga cambios en su estilo de vida, tome medicamentos o se someta a [place].  Comunquese con un mdico si tiene sntomas nuevos o los sntomas empeoran.  Tome los medicamentos de  venta libre y los recetados solamente como se lo haya indicado el mdico. No tome aspirina, ibuprofeno ni otros AINE a menos que el mdico se lo indique.  Concurra a todas las visitas de seguimiento como se lo haya indicado el mdico. Esto es importante. Esta informacin no tiene Marine scientist el consejo del mdico. Asegrese de hacerle al mdico cualquier pregunta que tenga. Document Revised: 06/05/2018 Document Reviewed: 06/05/2018 Elsevier Patient Education  [place].

## 2020-06-08 DIAGNOSIS — M545 Low back pain: Secondary | ICD-10-CM | POA: Diagnosis not present

## 2020-06-17 DIAGNOSIS — M545 Low back pain: Secondary | ICD-10-CM | POA: Diagnosis not present

## 2020-06-20 DIAGNOSIS — M545 Low back pain: Secondary | ICD-10-CM | POA: Diagnosis not present

## 2020-06-23 MED FILL — GENVOYA TABLET: 150-150-200 | 30 days supply | Qty: 30 | Fill #1

## 2020-06-24 DIAGNOSIS — M545 Low back pain: Secondary | ICD-10-CM | POA: Diagnosis not present

## 2020-06-27 DIAGNOSIS — M545 Low back pain: Secondary | ICD-10-CM | POA: Diagnosis not present

## 2020-07-01 DIAGNOSIS — M545 Low back pain: Secondary | ICD-10-CM | POA: Diagnosis not present

## 2020-07-04 DIAGNOSIS — M545 Low back pain: Secondary | ICD-10-CM | POA: Diagnosis not present

## 2020-07-12 DIAGNOSIS — M545 Low back pain: Secondary | ICD-10-CM | POA: Diagnosis not present

## 2020-07-12 DIAGNOSIS — M79662 Pain in left lower leg: Secondary | ICD-10-CM | POA: Diagnosis not present

## 2020-07-13 DIAGNOSIS — M545 Low back pain: Secondary | ICD-10-CM | POA: Diagnosis not present

## 2020-07-15 DIAGNOSIS — J069 Acute upper respiratory infection, unspecified: Secondary | ICD-10-CM | POA: Diagnosis not present

## 2020-07-15 DIAGNOSIS — Z6832 Body mass index (BMI) 32.0-32.9, adult: Secondary | ICD-10-CM | POA: Diagnosis not present

## 2020-07-15 DIAGNOSIS — E6609 Other obesity due to excess calories: Secondary | ICD-10-CM | POA: Diagnosis not present

## 2020-07-20 DIAGNOSIS — M545 Low back pain: Secondary | ICD-10-CM | POA: Diagnosis not present

## 2020-07-21 MED FILL — GENVOYA TABLET: 150-150-200 | 30 days supply | Qty: 30 | Fill #2

## 2020-07-22 ENCOUNTER — Other Ambulatory Visit: Payer: Self-pay

## 2020-07-22 ENCOUNTER — Ambulatory Visit (INDEPENDENT_AMBULATORY_CARE_PROVIDER_SITE_OTHER): Payer: BC Managed Care – PPO | Admitting: Gastroenterology

## 2020-07-22 ENCOUNTER — Encounter: Payer: Self-pay | Admitting: Gastroenterology

## 2020-07-22 VITALS — BP 156/85 | HR 75 | Temp 97.1°F | Ht 65.0 in | Wt 197.6 lb

## 2020-07-22 DIAGNOSIS — R1013 Epigastric pain: Secondary | ICD-10-CM | POA: Insufficient documentation

## 2020-07-22 DIAGNOSIS — K2100 Gastro-esophageal reflux disease with esophagitis, without bleeding: Secondary | ICD-10-CM | POA: Diagnosis not present

## 2020-07-22 DIAGNOSIS — R933 Abnormal findings on diagnostic imaging of other parts of digestive tract: Secondary | ICD-10-CM | POA: Diagnosis not present

## 2020-07-22 NOTE — Patient Instructions (Addendum)
1. Please have your labs done at [facility], [place], [place] [address] 2. If you have recurrent abdominal pain, please call us. 3. Return to the office in six months.  4. I will ask Dr. Gala Romney if you need to have a colonoscopy now to look at the colon wall thickening seen on CT scan.     Vaya al [facility]io de [facility] para un anlisis de [place]. [place], [place], [place] [address]  Llame si tiene dolor abdominal recurrente.  Visita al consultorio en seis meses para seguimiento.  Le preguntar al Dr. Gala Romney si necesita hacerse una colonoscopia ahora para observar el engrosamiento de la pared del colon que se ve en la tomografa computarizada.

## 2020-07-22 NOTE — Progress Notes (Signed)
Primary Care Physician: Sharilyn Sites, MD  Primary Gastroenterologist:  Garfield Cornea, MD   Chief Complaint  Patient presents with  . Abdominal Pain    had abd pain going into back for 5 days but fine now    HPI: Luis Gomez is a 64 y.o. Hispanic male here for further evaluation of abdominal pain.  He was last seen in July 2021. He does speak [place] fairly well but potential for some language barrier. Previously he presented with formal interpreter but he declined today when offered.   EGD in May 2021: Severe erosive/ulcerative reflux esophagitis extending up 8 cm from the GE junction.  Noncritical Schatzki ring not manipulated.  Medium sized hiatal hernia.  CT chest, abdomen, pelvis in May 2021: Mild distal esophageal wall thickening.  Scattered colonic diverticula with segmental thickening of the sigmoid without active inflammation, may reflect sequela of prior diverticular inflammation, consider correlation with prior colonoscopy.  Cholelithiasis.  For 5 days, pain in epigastric. Tried to cut back on food intake. No vomiting. Pain radiated into his back. Last night about 8pm, pain went away. First time he has been pain free in five days. BM regular. No melena, brbpr. No heartburn.   Weight up 10 pounds in the past 9 months.   Current Outpatient Medications  Medication Sig Dispense Refill  . gabapentin (NEURONTIN) 300 MG capsule Take 1-2 capsules by mouth 2 (two) times daily. Take 1 capsule in morning and 2 capsules at night    . [name] 150-150-200-10 MG TABS tablet TAKE 1 TABLET BY MOUTH DAILY WITH BREAKFAST. 30 tablet 3  . pantoprazole (PROTONIX) 40 MG tablet Take 1 tablet (40 mg total) by mouth daily before breakfast. 90 tablet 3  . predniSONE (DELTASONE) 10 MG tablet Take 10 mg by mouth daily with breakfast.    . sildenafil (REVATIO) 20 MG tablet Take 10 mg by mouth as needed (for ED).      No current facility-administered medications for this visit.     Allergies as of 07/22/2020  . (No Known Allergies)    ROS:  General: Negative for anorexia, weight loss, fever, chills, fatigue, weakness. ENT: Negative for hoarseness, difficulty swallowing , nasal congestion. CV: Negative for chest pain, angina, palpitations, dyspnea on exertion, peripheral edema.  Respiratory: Negative for dyspnea at rest, dyspnea on exertion, cough, sputum, wheezing.  GI: See history of present illness. GU:  Negative for dysuria, hematuria, urinary incontinence, urinary frequency, nocturnal urination.  Endo: Negative for unusual weight change.    Physical Examination:   BP (!) 156/85   Pulse 75   Temp (!) 97.1 F (36.2 C) (Oral)   Ht 5' 5"  (1.651 m)   Wt 197 lb 9.6 oz (89.6 kg)   BMI 32.88 kg/m   General: Well-nourished, well-developed in no acute distress.  Eyes: No icterus. Mouth: masked Lungs: Clear to auscultation bilaterally.  Heart: Regular rate and rhythm, no murmurs rubs or gallops.  Abdomen: Bowel sounds are normal, nontender, nondistended, no hepatosplenomegaly or masses, no abdominal bruits or hernia , no rebound or guarding.   Extremities: No lower extremity edema. No clubbing or deformities. Neuro: Alert and oriented x 4   Skin: Warm and dry, no jaundice.   Psych: Alert and cooperative, normal mood and affect.   Imaging Studies: No results found.   Impression/Plan: Pleasant 65 year old Hispanic male presenting for further evaluation of 5 days of upper abdominal pain with radiation into the back.    Abdominal pain: Started 5  days ago.  Limited oral intake since onset of symptoms.  Persistent but then resolved last night. He has a history of gallstones.  Differential includes possibility of biliary source.  Cannot exclude intermittent biliary obstruction or biliary pancreatitis.  His typical reflux symptoms have been well controlled.Check labs including c-Met, lipase, CBC.  Call with recurrent symptoms.   GERD: Well controlled.   Return to the office in six months for GERD, with interpreter if patient in agreement.   Abnormal CT of sigmoid colon: To discuss CT findings with Dr. Gala Romney.  CT with scattered distal colonic diverticula with more long segmental thickening of the sigmoid colon but no associated inflammation.  His last colonoscopy was in 2019.  May need updated colonoscopy, further recommendations to follow.

## 2020-07-23 LAB — CBC WITH DIFFERENTIAL/PLATELET
Absolute Monocytes: 635 cells/uL (ref 200–950)
Basophils Absolute: 44 cells/uL (ref 0–200)
Basophils Relative: 0.5 %
Eosinophils Absolute: 122 cells/uL (ref 15–500)
Eosinophils Relative: 1.4 %
HCT: 45.2 % (ref 38.5–50.0)
Hemoglobin: 15 g/dL (ref 13.2–17.1)
Lymphs Abs: 2558 cells/uL (ref 850–3900)
MCH: 27.8 pg (ref 27.0–33.0)
MCHC: 33.2 g/dL (ref 32.0–36.0)
MCV: 83.7 fL (ref 80.0–100.0)
MPV: 9.8 fL (ref 7.5–12.5)
Monocytes Relative: 7.3 %
Neutro Abs: 5342 cells/uL (ref 1500–7800)
Neutrophils Relative %: 61.4 %
Platelets: 346 10*3/uL (ref 140–400)
RBC: 5.4 10*6/uL (ref 4.20–5.80)
RDW: 12.9 % (ref 11.0–15.0)
Total Lymphocyte: 29.4 %
WBC: 8.7 10*3/uL (ref 3.8–10.8)

## 2020-07-23 LAB — LIPASE: Lipase: 24 U/L (ref 7–60)

## 2020-07-23 LAB — BASIC METABOLIC PANEL
BUN: 12 mg/dL (ref 7–25)
CO2: 26 mmol/L (ref 20–32)
Calcium: 9.6 mg/dL (ref 8.6–10.3)
Chloride: 101 mmol/L (ref 98–110)
Creat: 1.05 mg/dL (ref 0.70–1.25)
Glucose, Bld: 110 mg/dL — ABNORMAL HIGH (ref 65–99)
Potassium: 4.5 mmol/L (ref 3.5–5.3)
Sodium: 138 mmol/L (ref 135–146)

## 2020-07-23 LAB — HEPATIC FUNCTION PANEL
AG Ratio: 1.8 (calc) (ref 1.0–2.5)
ALT: 34 U/L (ref 9–46)
AST: 18 U/L (ref 10–35)
Albumin: 4.6 g/dL (ref 3.6–5.1)
Alkaline phosphatase (APISO): 80 U/L (ref 35–144)
Bilirubin, Direct: 0.1 mg/dL (ref 0.0–0.2)
Globulin: 2.5 g/dL (calc) (ref 1.9–3.7)
Indirect Bilirubin: 0.5 mg/dL (calc) (ref 0.2–1.2)
Total Bilirubin: 0.6 mg/dL (ref 0.2–1.2)
Total Protein: 7.1 g/dL (ref 6.1–8.1)

## 2020-07-25 DIAGNOSIS — M545 Low back pain: Secondary | ICD-10-CM | POA: Diagnosis not present

## 2020-07-27 ENCOUNTER — Encounter: Payer: Self-pay | Admitting: *Deleted

## 2020-07-28 DIAGNOSIS — M545 Low back pain: Secondary | ICD-10-CM | POA: Diagnosis not present

## 2020-08-01 DIAGNOSIS — M545 Low back pain: Secondary | ICD-10-CM | POA: Diagnosis not present

## 2020-08-02 DIAGNOSIS — M545 Low back pain: Secondary | ICD-10-CM | POA: Diagnosis not present

## 2020-08-04 DIAGNOSIS — M545 Low back pain: Secondary | ICD-10-CM | POA: Diagnosis not present

## 2020-08-08 DIAGNOSIS — M545 Low back pain, unspecified: Secondary | ICD-10-CM | POA: Diagnosis not present

## 2020-08-10 DIAGNOSIS — M545 Low back pain, unspecified: Secondary | ICD-10-CM | POA: Diagnosis not present

## 2020-08-12 ENCOUNTER — Other Ambulatory Visit: Payer: Self-pay

## 2020-08-12 ENCOUNTER — Other Ambulatory Visit: Payer: BC Managed Care – PPO

## 2020-08-12 DIAGNOSIS — E1365 Other specified diabetes mellitus with hyperglycemia: Secondary | ICD-10-CM

## 2020-08-12 DIAGNOSIS — Z113 Encounter for screening for infections with a predominantly sexual mode of transmission: Secondary | ICD-10-CM | POA: Diagnosis not present

## 2020-08-12 DIAGNOSIS — Z79899 Other long term (current) drug therapy: Secondary | ICD-10-CM

## 2020-08-12 DIAGNOSIS — B20 Human immunodeficiency virus [HIV] disease: Secondary | ICD-10-CM

## 2020-08-12 DIAGNOSIS — E1329 Other specified diabetes mellitus with other diabetic kidney complication: Secondary | ICD-10-CM | POA: Diagnosis not present

## 2020-08-12 DIAGNOSIS — IMO0002 Reserved for concepts with insufficient information to code with codable children: Secondary | ICD-10-CM

## 2020-08-12 LAB — T-HELPER CELL (CD4) - (RCID CLINIC ONLY)
CD4 % Helper T Cell: 29 % — ABNORMAL LOW (ref 33–65)
CD4 T Cell Abs: 751 /uL (ref 400–1790)

## 2020-08-15 LAB — COMPREHENSIVE METABOLIC PANEL
AG Ratio: 1.9 (calc) (ref 1.0–2.5)
ALT: 30 U/L (ref 9–46)
AST: 17 U/L (ref 10–35)
Albumin: 4.7 g/dL (ref 3.6–5.1)
Alkaline phosphatase (APISO): 78 U/L (ref 35–144)
BUN: 14 mg/dL (ref 7–25)
CO2: 30 mmol/L (ref 20–32)
Calcium: 9.6 mg/dL (ref 8.6–10.3)
Chloride: 101 mmol/L (ref 98–110)
Creat: 1 mg/dL (ref 0.70–1.25)
Globulin: 2.5 g/dL (calc) (ref 1.9–3.7)
Glucose, Bld: 112 mg/dL — ABNORMAL HIGH (ref 65–99)
Potassium: 4.3 mmol/L (ref 3.5–5.3)
Sodium: 139 mmol/L (ref 135–146)
Total Bilirubin: 0.5 mg/dL (ref 0.2–1.2)
Total Protein: 7.2 g/dL (ref 6.1–8.1)

## 2020-08-15 LAB — HIV-1 RNA QUANT-NO REFLEX-BLD
HIV 1 RNA Quant: <20 Copies/mL — ABNORMAL HIGH
HIV-1 RNA Quant, Log: <1.3 Log cps/mL — ABNORMAL HIGH

## 2020-08-15 LAB — CBC
HCT: 44.9 % (ref 38.5–50.0)
Hemoglobin: 14.8 g/dL (ref 13.2–17.1)
MCH: 27.9 pg (ref 27.0–33.0)
MCHC: 33 g/dL (ref 32.0–36.0)
MCV: 84.6 fL (ref 80.0–100.0)
MPV: 9.1 fL (ref 7.5–12.5)
Platelets: 285 10*3/uL (ref 140–400)
RBC: 5.31 10*6/uL (ref 4.20–5.80)
RDW: 13.2 % (ref 11.0–15.0)
WBC: 5.8 10*3/uL (ref 3.8–10.8)

## 2020-08-15 LAB — LIPID PANEL
Cholesterol: 244 mg/dL — ABNORMAL HIGH (ref <200)
HDL: 38 mg/dL — ABNORMAL LOW (ref >=40)
LDL Cholesterol (Calc): 165 mg/dL (calc) — ABNORMAL HIGH
Non-HDL Cholesterol (Calc): 206 mg/dL (calc) — ABNORMAL HIGH (ref <130)
Total CHOL/HDL Ratio: 6.4 (calc) — ABNORMAL HIGH (ref <5.0)
Triglycerides: 253 mg/dL — ABNORMAL HIGH (ref <150)

## 2020-08-15 LAB — PSA: PSA: 1.1 ng/mL (ref <=4.0)

## 2020-08-15 LAB — RPR: RPR Ser Ql: NONREACTIVE

## 2020-08-16 DIAGNOSIS — M545 Low back pain, unspecified: Secondary | ICD-10-CM | POA: Diagnosis not present

## 2020-08-17 MED FILL — GENVOYA TABLET: 150-150-200 | 30 days supply | Qty: 30 | Fill #3

## 2020-08-26 ENCOUNTER — Ambulatory Visit (INDEPENDENT_AMBULATORY_CARE_PROVIDER_SITE_OTHER): Payer: BC Managed Care – PPO | Admitting: Infectious Diseases

## 2020-08-26 ENCOUNTER — Encounter: Payer: Self-pay | Admitting: Infectious Diseases

## 2020-08-26 ENCOUNTER — Other Ambulatory Visit: Payer: Self-pay

## 2020-08-26 ENCOUNTER — Other Ambulatory Visit (INDEPENDENT_AMBULATORY_CARE_PROVIDER_SITE_OTHER): Payer: BC Managed Care – PPO

## 2020-08-26 VITALS — BP 146/84 | HR 90 | Wt 197.0 lb

## 2020-08-26 DIAGNOSIS — I10 Essential (primary) hypertension: Secondary | ICD-10-CM | POA: Diagnosis not present

## 2020-08-26 DIAGNOSIS — Z23 Encounter for immunization: Secondary | ICD-10-CM | POA: Diagnosis not present

## 2020-08-26 DIAGNOSIS — R1013 Epigastric pain: Secondary | ICD-10-CM | POA: Diagnosis not present

## 2020-08-26 DIAGNOSIS — Z113 Encounter for screening for infections with a predominantly sexual mode of transmission: Secondary | ICD-10-CM

## 2020-08-26 DIAGNOSIS — G8929 Other chronic pain: Secondary | ICD-10-CM

## 2020-08-26 DIAGNOSIS — M549 Dorsalgia, unspecified: Secondary | ICD-10-CM | POA: Insufficient documentation

## 2020-08-26 DIAGNOSIS — Z79899 Other long term (current) drug therapy: Secondary | ICD-10-CM

## 2020-08-26 DIAGNOSIS — B20 Human immunodeficiency virus [HIV] disease: Secondary | ICD-10-CM | POA: Diagnosis not present

## 2020-08-26 DIAGNOSIS — R933 Abnormal findings on diagnostic imaging of other parts of digestive tract: Secondary | ICD-10-CM

## 2020-08-26 NOTE — Assessment & Plan Note (Signed)
Multiple visits with ortho.  He is not retired due to work no accepting his work note.

## 2020-08-26 NOTE — Progress Notes (Signed)
   Subjective:    Patient ID: Luis Gomez, male    DOB: [birth year], 64 y.o.   MRN: [id number]  HPI 64yo M from [place] in [name] since 1982, with HIV+, on atripla -->genvoya (04-2015). Also hx of GERD, elevated Glc.  Had colonoscopy in 2019.Is scheduled for repeat next month due to polyps.  He was in hospital 5-27/28 for CP and nausea. He ruled out and was presumed to have GI source (relieved with cocktail). Started on ppi. He has since stopped and had no further sx.  NL TTE Has had COVID vax but doesn't remember date.     HIV 1 RNA Quant  Date Value  08/12/2020 <20 Copies/mL (H)  10/09/2019 <20 DETECTED copies/mL (A)  12/31/2018 <20 DETECTED copies/mL (A)   CD4 T Cell Abs (/uL)  Date Value  08/12/2020 751  10/09/2019 946  12/31/2018 900    Review of Systems  Constitutional: Negative for appetite change and unexpected weight change.  Respiratory: Negative for shortness of breath.   Cardiovascular: Negative for chest pain.  Gastrointestinal: Negative for blood in stool, constipation and diarrhea.  Genitourinary: Negative for difficulty urinating.       Objective:   Physical Exam        Assessment & Plan:

## 2020-08-26 NOTE — Assessment & Plan Note (Addendum)
Doing well Given copy of his labs  no change in ART PCV 23 today Given condoms rtc in 9 months

## 2020-08-26 NOTE — Assessment & Plan Note (Signed)
Mild-mod elevation.  Asx.  He will f/u with his pcp

## 2020-08-26 NOTE — Assessment & Plan Note (Signed)
Repeat colon next month.

## 2020-08-26 NOTE — Assessment & Plan Note (Signed)
Improved, off PPI Will watch.

## 2020-09-07 ENCOUNTER — Other Ambulatory Visit: Payer: Self-pay | Admitting: Infectious Diseases

## 2020-09-07 DIAGNOSIS — B20 Human immunodeficiency virus [HIV] disease: Secondary | ICD-10-CM

## 2020-09-09 MED FILL — GENVOYA TABLET: 150-150-200 | 30 days supply | Qty: 30 | Fill #0

## 2020-09-13 ENCOUNTER — Other Ambulatory Visit (HOSPITAL_COMMUNITY)
Admission: RE | Admit: 2020-09-13 | Discharge: 2020-09-13 | Disposition: A | Discharge status: Home / Self Care | Payer: BC Managed Care – PPO | Source: Ambulatory Visit | Attending: Internal Medicine | Admitting: Internal Medicine

## 2020-09-13 ENCOUNTER — Other Ambulatory Visit: Payer: Self-pay

## 2020-09-13 DIAGNOSIS — Z20822 Contact with and (suspected) exposure to covid-19: Secondary | ICD-10-CM | POA: Insufficient documentation

## 2020-09-13 DIAGNOSIS — K5730 Diverticulosis of large intestine without perforation or abscess without bleeding: Secondary | ICD-10-CM | POA: Diagnosis not present

## 2020-09-13 DIAGNOSIS — Z79899 Other long term (current) drug therapy: Secondary | ICD-10-CM | POA: Diagnosis not present

## 2020-09-13 DIAGNOSIS — D120 Benign neoplasm of cecum: Secondary | ICD-10-CM | POA: Diagnosis not present

## 2020-09-13 DIAGNOSIS — D122 Benign neoplasm of ascending colon: Secondary | ICD-10-CM | POA: Diagnosis not present

## 2020-09-13 DIAGNOSIS — Z01812 Encounter for preprocedural laboratory examination: Secondary | ICD-10-CM | POA: Insufficient documentation

## 2020-09-13 DIAGNOSIS — R933 Abnormal findings on diagnostic imaging of other parts of digestive tract: Secondary | ICD-10-CM | POA: Diagnosis not present

## 2020-09-13 LAB — SARS CORONAVIRUS 2 (TAT 6-24 HRS): SARS Coronavirus 2: NEGATIVE

## 2020-09-14 ENCOUNTER — Encounter (HOSPITAL_COMMUNITY)
Admission: RE | Disposition: A | Discharge status: Home / Self Care | Payer: Self-pay | Source: Home / Self Care | Attending: Internal Medicine

## 2020-09-14 ENCOUNTER — Ambulatory Visit (HOSPITAL_COMMUNITY)
Admission: RE | Admit: 2020-09-14 | Discharge: 2020-09-14 | Disposition: A | Discharge status: Home / Self Care | Payer: BC Managed Care – PPO | Attending: Internal Medicine | Admitting: Internal Medicine

## 2020-09-14 ENCOUNTER — Other Ambulatory Visit: Payer: Self-pay

## 2020-09-14 ENCOUNTER — Encounter (HOSPITAL_COMMUNITY): Payer: Self-pay | Admitting: Internal Medicine

## 2020-09-14 DIAGNOSIS — D120 Benign neoplasm of cecum: Secondary | ICD-10-CM | POA: Diagnosis not present

## 2020-09-14 DIAGNOSIS — D122 Benign neoplasm of ascending colon: Secondary | ICD-10-CM | POA: Diagnosis not present

## 2020-09-14 DIAGNOSIS — Z79899 Other long term (current) drug therapy: Secondary | ICD-10-CM | POA: Insufficient documentation

## 2020-09-14 DIAGNOSIS — R933 Abnormal findings on diagnostic imaging of other parts of digestive tract: Secondary | ICD-10-CM | POA: Diagnosis not present

## 2020-09-14 DIAGNOSIS — K5730 Diverticulosis of large intestine without perforation or abscess without bleeding: Secondary | ICD-10-CM | POA: Insufficient documentation

## 2020-09-14 DIAGNOSIS — K635 Polyp of colon: Secondary | ICD-10-CM | POA: Diagnosis not present

## 2020-09-14 DIAGNOSIS — Z20822 Contact with and (suspected) exposure to covid-19: Secondary | ICD-10-CM | POA: Diagnosis not present

## 2020-09-14 HISTORY — PX: POLYPECTOMY: SHX5525

## 2020-09-14 HISTORY — PX: COLONOSCOPY: SHX5424

## 2020-09-14 SURGERY — COLONOSCOPY
Anesthesia: Moderate Sedation

## 2020-09-14 MED ORDER — MEPERIDINE HCL 50 MG/ML IJ SOLN
INTRAMUSCULAR | Status: DC
Start: 2020-09-14 — End: 2020-09-14
  Filled 2020-09-14: qty 1

## 2020-09-14 MED ORDER — ONDANSETRON HCL 4 MG/2ML IJ SOLN
INTRAMUSCULAR | Status: AC
  Filled 2020-09-14: qty 2

## 2020-09-14 MED ORDER — MEPERIDINE HCL 100 MG/ML IJ SOLN
INTRAMUSCULAR | Status: DC | PRN
Start: 2020-09-14 — End: 2020-09-14
  Administered 2020-09-14: 25 mg

## 2020-09-14 MED ORDER — MIDAZOLAM HCL 5 MG/5ML IJ SOLN
INTRAMUSCULAR | Status: AC
  Filled 2020-09-14: qty 10

## 2020-09-14 MED ORDER — MIDAZOLAM HCL 5 MG/5ML IJ SOLN
INTRAMUSCULAR | Status: DC | PRN
  Administered 2020-09-14: 2 mg via INTRAVENOUS
  Administered 2020-09-14 (×2): 1 mg via INTRAVENOUS

## 2020-09-14 MED ORDER — SODIUM CHLORIDE 0.9 % IV SOLN: INTRAVENOUS | Status: DC

## 2020-09-14 MED ORDER — ONDANSETRON HCL 4 MG/2ML IJ SOLN
INTRAMUSCULAR | Status: DC | PRN
  Administered 2020-09-14: 4 mg via INTRAVENOUS

## 2020-09-14 NOTE — H&P (Signed)
@LOGO @   Primary Care Physician:  Sharilyn Sites, MD Primary Gastroenterologist:  Dr. Gala Romney  Pre-Procedure History & Physical: HPI:  Luis Gomez is a 64 y.o. malefurther evaluation of abnormal sigmoid colon on CT recently.  Correlated with abdominal pain.  Treated for diverticulitis.  History of adenoma removed previously.  Clinically much improved.  Here for diagnostic colonoscopy to further evaluate abnormal CT.  Past Medical History:  Diagnosis Date   Diverticulosis    Genital HSV    GERD (gastroesophageal reflux disease)    Last EGD->07/02/06 normal   HIV disease ([place])    S/P colonoscopy 07/02/2006   Left sided diverticula    Past Surgical History:  Procedure Laterality Date   BACK SURGERY     COLONOSCOPY  07/03/2006   Normal rectum, left-sided diverticulum   COLONOSCOPY N/A 02/07/2018   Xochitl Egle: Sessile serrated polyp removed from the colon, tubular adenoma.  Next colonoscopy in 5 years.   ESOPHAGOGASTRODUODENOSCOPY  07/03/2006   Normal esophagus, stomach, D1, D2.   ESOPHAGOGASTRODUODENOSCOPY N/A 04/01/2020   Marrianne Sica: Severe erosive/ulcerative reflux esophagitis extending up 8 cm from the GE junction.  Noncritical Schatzki ring not manipulated.  Medium sized hiatal hernia.   POLYPECTOMY  02/07/2018   Procedure: POLYPECTOMY;  Surgeon: Daneil Dolin, MD;  [facility];  Service: Endoscopy;;  colon     Prior to Admission medications   Medication Sig Start Date End Date Taking? Authorizing Provider  [name] 150-150-200-10 MG TABS tablet TAKE 1 TABLET BY MOUTH DAILY WITH BREAKFAST. Patient taking differently: Take 1 tablet by mouth at bedtime.  09/07/20  Yes Campbell Riches, MD  pantoprazole (PROTONIX) 40 MG tablet Take 40 mg by mouth daily before lunch.   Yes [provider]    Allergies as of 07/27/2020   (No Known Allergies)    History reviewed. No pertinent family history.  Social History   Socioeconomic History   Marital  status: Divorced    Spouse name: Not on file   Number of children: 4   Years of education: Not on file   Highest education level: Not on file  Occupational History   Occupation: Equity    Employer: [employer]  Tobacco Use   Smoking status: Never Smoker   Smokeless tobacco: Never Used  Scientific laboratory technician Use: Never used  Substance and Sexual Activity   Alcohol use: No   Drug use: No   Sexual activity: Not Currently    Comment: pt. given condoms  Other Topics Concern   Not on file  Social History Narrative   Not on file   Social Determinants of Health   Financial Resource Strain:    Difficulty of Paying Living Expenses: Not on file  Food Insecurity:    Worried About Charity fundraiser in the Last Year: Not on file   [employer] of Food in the Last Year: Not on file  Transportation Needs:    Lack of Transportation (Medical): Not on file   Lack of Transportation (Non-Medical): Not on file  Physical Activity:    Days of Exercise per Week: Not on file   Minutes of Exercise per Session: Not on file  Stress:    Feeling of Stress : Not on file  Social Connections:    Frequency of Communication with Friends and Family: Not on file   Frequency of Social Gatherings with Friends and Family: Not on file   Attends Religious Services: Not on file   Active Member  of Clubs or Organizations: Not on file   Attends Archivist Meetings: Not on file   Marital Status: Not on file  Intimate Partner Violence:    Fear of Current or Ex-Partner: Not on file   Emotionally Abused: Not on file   Physically Abused: Not on file   Sexually Abused: Not on file    Review of Systems: See HPI, otherwise negative ROS  Physical Exam: Ht 5\' 5"  (1.651 m)    Wt 88.5 kg    BMI 32.45 kg/m  General:   Alert,  Well-developed, well-nourished, pleasant and cooperative in NAD Mouth:  No deformity or lesions. Neck:  Supple; no masses or thyromegaly. No significant  cervical adenopathy. Lungs:  Clear throughout to auscultation.   No wheezes, crackles, or rhonchi. No acute distress. Heart:  Regular rate and rhythm; no murmurs, clicks, rubs,  or gallops. Abdomen: Non-distended, normal bowel sounds.  Soft and nontender without appreciable mass or hepatosplenomegaly.  Pulses:  Normal pulses noted. Extremities:  Without clubbing or edema.  Impression/Plan: 64 year old gentleman here for diagnostic colonoscopy.  Abnormal left colon on CT.  History of colonic adenoma. The risks, benefits, limitations, alternatives and imponderables have been reviewed with the patient. Questions have been answered. All parties are agreeable.   Via interpreter:  The risks, benefits, limitations, alternatives and imponderables have been reviewed with the patient. Questions have been answered. All parties are agreeable.    Notice: This dictation was prepared with Dragon dictation along with smaller phrase technology. Any transcriptional errors that result from this process are unintentional and may not be corrected upon review.

## 2020-09-14 NOTE — Op Note (Signed)
[facility] Patient Name: Luis Gomez [facility] Procedure Date: 09/14/2020 10:58 AM MRN: [id number] Date of Birth: [birth year] Attending MD: Norvel Richards , MD CSN: [id number] Age: 64 Admit Type: Outpatient Procedure:                Colonoscopy Indications:              Abnormal CT of the GI tract Providers:                Norvel Richards, MD, Janeece Riggers, RN, Raphael Gibney, Technician Referring MD:              Medicines:                Midazolam 4 mg IV, Meperidine 25 mg IV Complications:            No immediate complications. Estimated Blood Loss:     Estimated blood loss was minimal. Procedure:                Pre-Anesthesia Assessment:                           - Prior to the procedure, a History and Physical                            was performed, and patient medications and                            allergies were reviewed. The patient's tolerance of                            previous anesthesia was also reviewed. The risks                            and benefits of the procedure and the sedation                            options and risks were discussed with the patient.                            All questions were answered, and informed consent                            was obtained. Prior Anticoagulants: The patient has                            taken no previous anticoagulant or antiplatelet                            agents. ASA Grade Assessment: II - A patient with                            mild systemic disease. After reviewing the risks  and benefits, the patient was deemed in                            satisfactory condition to undergo the procedure.                           After obtaining informed consent, the colonoscope                            was passed under direct vision. Throughout the                            procedure, the patient's blood pressure, pulse, and                             oxygen saturations were monitored continuously. The                            CF-HQ190L ([id number]) scope was introduced through                            the anus and advanced to the the cecum, identified                            by appendiceal orifice and ileocecal valve. The                            colonoscopy was performed without difficulty. The                            patient tolerated the procedure well. The quality                            of the bowel preparation was adequate. Scope In: 12:23:35 PM Scope Out: 12:41:51 PM Scope Withdrawal Time: 0 hours 14 minutes 42 seconds  Total Procedure Duration: 0 hours 18 minutes 16 seconds  Findings:      The perianal and digital rectal examinations were normal.      Many small and large-mouthed diverticula were found in the sigmoid colon       and descending colon.      Two semi-pedunculated polyps were found in the ascending colon and       cecum. The polyps were 3 to 8 mm in size. These polyps were removed with       a cold snare. Resection and retrieval were complete. Estimated blood       loss was minimal.      The exam was otherwise without abnormality on direct and retroflexion       views. Impression:               - Diverticulosis in the sigmoid colon and in the                            descending colon.                           -  Two 3 to 8 mm polyps in the ascending colon and                            in the cecum, removed with a cold snare. Resected                            and retrieved.                           - The examination was otherwise normal on direct                            and retroflexion views. I suspect prior abdominal                            pain and abnormal CT findings secondary to                            diverticulitis which is resolved. Moderate Sedation:      Moderate (conscious) sedation was administered by the endoscopy nurse       and supervised by the endoscopist.  The following parameters were       monitored: oxygen saturation, heart rate, blood pressure, respiratory       rate, EKG, adequacy of pulmonary ventilation, and response to care.       Total physician intraservice time was 19 minutes. Recommendation:           - Patient has a contact number available for                            emergencies. The signs and symptoms of potential                            delayed complications were discussed with the                            patient. Return to normal activities tomorrow.                            Written discharge instructions were provided to the                            patient.                           - Resume previous diet.                           - Continue present medications.                           - Repeat colonoscopy date to be determined after                            pending pathology results are reviewed for  surveillance based on pathology results. Begin                            Benefiber 2 tablespoons daily.                           - Return to GI office in 6 months. Procedure Code(s):        --- Professional ---                           [phone], Colonoscopy, flexible; with removal of                            tumor(s), polyp(s), or other lesion(s) by snare                            technique                           G0500, Moderate sedation services provided by the                            same physician or other qualified health care                            professional performing a gastrointestinal                            endoscopic service that sedation supports,                            requiring the presence of an independent trained                            observer to assist in the monitoring of the                            patient's level of consciousness and physiological                            status; initial 15 minutes of intra-service time;                             patient age 86 years or older (additional time may                            be reported with [phone], as appropriate) Diagnosis Code(s):        --- Professional ---                           K63.5, Polyp of colon                           K57.30, Diverticulosis of large intestine without  perforation or abscess without bleeding                           R93.3, Abnormal findings on diagnostic imaging of                            other parts of digestive tract CPT copyright 2019 American Medical Association. All rights reserved. The codes documented in this report are preliminary and upon coder review may  be revised to meet current compliance requirements. [name]. Brynlyn Dade, MD Norvel Richards, MD 09/14/2020 12:47:51 PM This report has been signed electronically. Number of Addenda: 0

## 2020-09-14 NOTE — Discharge Instructions (Signed)
Colonoscopy Discharge Instructions  Read the instructions outlined below and refer to this sheet in the next few weeks. These discharge instructions provide you with general information on caring for yourself after you leave the hospital. Your doctor may also give you specific instructions. While your treatment has been planned according to the most current medical practices available, unavoidable complications occasionally occur. If you have any problems or questions after discharge, call Dr. Gala Romney at [phone]. ACTIVITY  You may resume your regular activity, but move at a slower pace for the next 24 hours.   Take frequent rest periods for the next 24 hours.   Walking will help get rid of the air and reduce the bloated feeling in your belly (abdomen).   No driving for 24 hours (because of the medicine (anesthesia) used during the test).    Do not sign any important legal documents or operate any machinery for 24 hours (because of the anesthesia used during the test).  NUTRITION  Drink plenty of fluids.   You may resume your normal diet as instructed by your doctor.   Begin with a light meal and progress to your normal diet. Heavy or fried foods are harder to digest and may make you feel sick to your stomach (nauseated).   Avoid alcoholic beverages for 24 hours or as instructed.  MEDICATIONS  You may resume your normal medications unless your doctor tells you otherwise.  WHAT YOU CAN EXPECT TODAY  Some feelings of bloating in the abdomen.   Passage of more gas than usual.   Spotting of blood in your stool or on the toilet paper.  IF YOU HAD POLYPS REMOVED DURING THE COLONOSCOPY:  No aspirin products for 7 days or as instructed.   No alcohol for 7 days or as instructed.   Eat a soft diet for the next 24 hours.  FINDING OUT THE RESULTS OF YOUR TEST Not all test results are available during your visit. If your test results are not back during the visit, make an appointment  with your caregiver to find out the results. Do not assume everything is normal if you have not heard from your caregiver or the medical facility. It is important for you to follow up on all of your test results.  SEEK IMMEDIATE MEDICAL ATTENTION IF:  You have more than a spotting of blood in your stool.   Your belly is swollen (abdominal distention).   You are nauseated or vomiting.   You have a temperature over 101.   You have abdominal pain or discomfort that is severe or gets worse throughout the day.   Diverticulosis found. 2 polyps found and removed. No tumor seen.  Further recommendations to follow pending review of pathology report  Begin Benefiber 2 tablespoons daily-take every day indefinitely  Office visit with [name] in 6 months-Leslie Lewis  At patient request, I called [name] at [phone] -left message on answering service.    Diverticulosis  Diverticulosis is a condition that develops when small pouches (diverticula) form in the wall of the large intestine (colon). The colon is where water is absorbed and stool (feces) is formed. The pouches form when the inside layer of the colon pushes through weak spots in the outer layers of the colon. You may have a few pouches or many of them. The pouches usually do not cause problems unless they become inflamed or infected. When this happens, the condition is called diverticulitis. What are the causes? The cause of this condition is not  known. What increases the risk? The following factors may make you more likely to develop this condition:  Being older than age 70. Your risk for this condition increases with age. Diverticulosis is rare among people younger than age 61. By age 8, many people have it.  Eating a low-fiber diet.  Having frequent constipation.  Being overweight.  Not getting enough exercise.  Smoking.  Taking over-the-counter pain medicines, like aspirin and ibuprofen.  Having a family history of  diverticulosis. What are the signs or symptoms? In most people, there are no symptoms of this condition. If you do have symptoms, they may include:  Bloating.  Cramps in the abdomen.  Constipation or diarrhea.  Pain in the lower left side of the abdomen. How is this diagnosed? Because diverticulosis usually has no symptoms, it is most often diagnosed during an exam for other colon problems. The condition may be diagnosed by:  Using a flexible scope to examine the colon (colonoscopy).  Taking an X-ray of the colon after dye has been put into the colon (barium enema).  Having a CT scan. How is this treated? You may not need treatment for this condition. Your health care provider may recommend treatment to prevent problems. You may need treatment if you have symptoms or if you previously had diverticulitis. Treatment may include:  Eating a high-fiber diet.  Taking a fiber supplement.  Taking a live bacteria supplement (probiotic).  Taking medicine to relax your colon. Follow these instructions at home: Medicines  Take over-the-counter and prescription medicines only as told by your health care provider.  If told by your health care provider, take a fiber supplement or probiotic. Constipation prevention Your condition may cause constipation. To prevent or treat constipation, you may need to:  Drink enough fluid to keep your urine pale yellow.  Take over-the-counter or prescription medicines.  Eat foods that are high in fiber, such as beans, whole grains, and fresh fruits and vegetables.  Limit foods that are high in fat and processed sugars, such as fried or sweet foods.  General instructions  Try not to strain when you have a bowel movement.  Keep all follow-up visits as told by your health care provider. This is important. Contact a health care provider if you:  Have pain in your abdomen.  Have bloating.  Have cramps.  Have not had a bowel movement in 3  days. Get help right away if:  Your pain gets worse.  Your bloating becomes very bad.  You have a fever or chills, and your symptoms suddenly get worse.  You vomit.  You have bowel movements that are bloody or black.  You have bleeding from your rectum. Summary  Diverticulosis is a condition that develops when small pouches (diverticula) form in the wall of the large intestine (colon).  You may have a few pouches or many of them.  This condition is most often diagnosed during an exam for other colon problems.  Treatment may include increasing the fiber in your diet, taking supplements, or taking medicines. This information is not intended to replace advice given to you by your health care provider. Make sure you discuss any questions you have with your health care provider. Document Revised: 05/21/2019 Document Reviewed: 05/21/2019 Elsevier Patient Education  [place].  Colon Polyps  Polyps are tissue growths inside the body. Polyps can grow in many places, including the large intestine (colon). A polyp may be a round bump or a mushroom-shaped growth. You could have one  polyp or several. Most colon polyps are noncancerous (benign). However, some colon polyps can become cancerous over time. Finding and removing the polyps early can help prevent this. What are the causes? The exact cause of colon polyps is not known. What increases the risk? You are more likely to develop this condition if you:  Have a family history of colon cancer or colon polyps.  Are older than 59 or older than 45 if you are African American.  Have inflammatory bowel disease, such as ulcerative colitis or Crohn's disease.  Have certain hereditary conditions, such as: ? Familial adenomatous polyposis. ? Lynch syndrome. ? Turcot syndrome. ? Peutz-Jeghers syndrome.  Are overweight.  Smoke cigarettes.  Do not get enough exercise.  Drink too much alcohol.  Eat a diet that is high in fat  and red meat and low in fiber.  Had childhood cancer that was treated with abdominal radiation. What are the signs or symptoms? Most polyps do not cause symptoms. If you have symptoms, they may include:  Blood coming from your rectum when having a bowel movement.  Blood in your stool. The stool may look dark red or black.  Abdominal pain.  A change in bowel habits, such as constipation or diarrhea. How is this diagnosed? This condition is diagnosed with a colonoscopy. This is a procedure in which a lighted, flexible scope is inserted into the anus and then passed into the colon to examine the area. Polyps are sometimes found when a colonoscopy is done as part of routine cancer screening tests. How is this treated? Treatment for this condition involves removing any polyps that are found. Most polyps can be removed during a colonoscopy. Those polyps will then be tested for cancer. Additional treatment may be needed depending on the results of testing. Follow these instructions at home: Lifestyle  Maintain a healthy weight, or lose weight if recommended by your health care provider.  Exercise every day or as told by your health care provider.  Do not use any products that contain nicotine or tobacco, such as cigarettes and e-cigarettes. If you need help quitting, ask your health care provider.  If you drink alcohol, limit how much you have: ? 0-1 drink a day for women. ? 0-2 drinks a day for men.  Be aware of how much alcohol is in your drink. In the [place] one drink equals one 12 oz bottle of beer (355 mL), one 5 oz glass of wine (148 mL), or one 1 oz shot of hard liquor (44 mL). Eating and drinking   Eat foods that are high in fiber, such as fruits, vegetables, and whole grains.  Eat foods that are high in calcium and vitamin D, such as milk, cheese, yogurt, eggs, liver, fish, and broccoli.  Limit foods that are high in fat, such as fried foods and desserts.  Limit the amount  of red meat and processed meat you eat, such as hot dogs, sausage, bacon, and lunch meats. General instructions  Keep all follow-up visits as told by your health care provider. This is important. ? This includes having regularly scheduled colonoscopies. ? Talk to your health care provider about when you need a colonoscopy. Contact a health care provider if:  You have new or worsening bleeding during a bowel movement.  You have new or increased blood in your stool.  You have a change in bowel habits.  You lose weight for no known reason. Summary  Polyps are tissue growths inside the body. Polyps can  grow in many places, including the colon.  Most colon polyps are noncancerous (benign), but some can become cancerous over time.  This condition is diagnosed with a colonoscopy.  Treatment for this condition involves removing any polyps that are found. Most polyps can be removed during a colonoscopy. This information is not intended to replace advice given to you by your health care provider. Make sure you discuss any questions you have with your health care provider. Document Revised: 02/06/2018 Document Reviewed: 02/06/2018 Elsevier Patient Education  [place].

## 2020-09-15 LAB — SURGICAL PATHOLOGY

## 2020-09-16 ENCOUNTER — Encounter: Payer: Self-pay | Admitting: Internal Medicine

## 2020-09-20 ENCOUNTER — Encounter (HOSPITAL_COMMUNITY): Payer: Self-pay | Admitting: Internal Medicine

## 2020-10-13 MED FILL — GENVOYA TABLET: 150-150-200 | 30 days supply | Qty: 30 | Fill #1

## 2020-11-14 ENCOUNTER — Other Ambulatory Visit (HOSPITAL_COMMUNITY): Payer: Self-pay | Admitting: Pharmacist

## 2020-11-14 ENCOUNTER — Telehealth: Payer: Self-pay

## 2020-11-14 MED FILL — GENVOYA TABLET: 150-150-200 | 30 days supply | Qty: 30 | Fill #2

## 2020-11-14 NOTE — Telephone Encounter (Addendum)
[facility] Patient Advocate Encounter  Completed and sent [employer] Advancing Access application for [name] for this patient who is uninsured.    Patient is approved 11/14/20 through 11/14/21.       Shary Key CPhT [facility] Patient [facility] Phone: [phone] Fax:  [phone]

## 2020-11-17 DIAGNOSIS — J042 Acute laryngotracheitis: Secondary | ICD-10-CM | POA: Diagnosis not present

## 2020-11-17 DIAGNOSIS — J209 Acute bronchitis, unspecified: Secondary | ICD-10-CM | POA: Diagnosis not present

## 2020-11-17 DIAGNOSIS — Z21 Asymptomatic human immunodeficiency virus [HIV] infection status: Secondary | ICD-10-CM | POA: Diagnosis not present

## 2020-11-17 DIAGNOSIS — J329 Chronic sinusitis, unspecified: Secondary | ICD-10-CM | POA: Diagnosis not present

## 2020-12-06 DIAGNOSIS — Z6831 Body mass index (BMI) 31.0-31.9, adult: Secondary | ICD-10-CM | POA: Diagnosis not present

## 2020-12-06 DIAGNOSIS — R030 Elevated blood-pressure reading, without diagnosis of hypertension: Secondary | ICD-10-CM | POA: Diagnosis not present

## 2020-12-06 DIAGNOSIS — Z419 Encounter for procedure for purposes other than remedying health state, unspecified: Secondary | ICD-10-CM | POA: Diagnosis not present

## 2020-12-06 DIAGNOSIS — Z0001 Encounter for general adult medical examination with abnormal findings: Secondary | ICD-10-CM | POA: Diagnosis not present

## 2020-12-06 DIAGNOSIS — E7849 Other hyperlipidemia: Secondary | ICD-10-CM | POA: Diagnosis not present

## 2020-12-06 DIAGNOSIS — R1031 Right lower quadrant pain: Secondary | ICD-10-CM | POA: Diagnosis not present

## 2020-12-09 ENCOUNTER — Ambulatory Visit: Payer: BC Managed Care – PPO | Admitting: Gastroenterology

## 2020-12-12 MED FILL — GENVOYA TABLET: 150-150-200 | 30 days supply | Qty: 30 | Fill #3

## 2021-01-03 DIAGNOSIS — Z419 Encounter for procedure for purposes other than remedying health state, unspecified: Secondary | ICD-10-CM | POA: Diagnosis not present

## 2021-01-10 MED FILL — GENVOYA TABLET: 150-150-200 | 30 days supply | Qty: 30 | Fill #4

## 2021-01-23 ENCOUNTER — Ambulatory Visit: Payer: BC Managed Care – PPO | Admitting: Gastroenterology

## 2021-01-31 ENCOUNTER — Other Ambulatory Visit (HOSPITAL_COMMUNITY): Payer: Self-pay

## 2021-02-03 DIAGNOSIS — Z419 Encounter for procedure for purposes other than remedying health state, unspecified: Secondary | ICD-10-CM | POA: Diagnosis not present

## 2021-02-04 ENCOUNTER — Other Ambulatory Visit (HOSPITAL_COMMUNITY): Payer: Self-pay

## 2021-02-04 MED FILL — Elvitegrav-Cobic-Emtricitab-Tenofov AF Tab 150-150-200-10 MG: ORAL | 30 days supply | Qty: 30 | Fill #0 | Status: AC

## 2021-02-06 ENCOUNTER — Other Ambulatory Visit (HOSPITAL_COMMUNITY): Payer: Self-pay

## 2021-02-07 ENCOUNTER — Other Ambulatory Visit (HOSPITAL_COMMUNITY): Payer: Self-pay

## 2021-03-05 DIAGNOSIS — Z419 Encounter for procedure for purposes other than remedying health state, unspecified: Secondary | ICD-10-CM | POA: Diagnosis not present

## 2021-03-07 ENCOUNTER — Other Ambulatory Visit (HOSPITAL_COMMUNITY): Payer: Self-pay

## 2021-03-07 MED FILL — Elvitegrav-Cobic-Emtricitab-Tenofov AF Tab 150-150-200-10 MG: ORAL | 30 days supply | Qty: 30 | Fill #0 | Status: AC

## 2021-03-08 ENCOUNTER — Other Ambulatory Visit (HOSPITAL_COMMUNITY): Payer: Self-pay

## 2021-03-09 DIAGNOSIS — E6609 Other obesity due to excess calories: Secondary | ICD-10-CM | POA: Diagnosis not present

## 2021-03-09 DIAGNOSIS — Z6831 Body mass index (BMI) 31.0-31.9, adult: Secondary | ICD-10-CM | POA: Diagnosis not present

## 2021-03-09 DIAGNOSIS — R252 Cramp and spasm: Secondary | ICD-10-CM | POA: Diagnosis not present

## 2021-03-09 DIAGNOSIS — S39012A Strain of muscle, fascia and tendon of lower back, initial encounter: Secondary | ICD-10-CM | POA: Diagnosis not present

## 2021-03-14 ENCOUNTER — Ambulatory Visit (INDEPENDENT_AMBULATORY_CARE_PROVIDER_SITE_OTHER): Payer: Medicaid Other | Admitting: Gastroenterology

## 2021-03-14 ENCOUNTER — Encounter: Payer: Self-pay | Admitting: Gastroenterology

## 2021-03-14 VITALS — BP 140/84 | HR 77 | Temp 97.3°F | Ht 66.0 in | Wt 195.4 lb

## 2021-03-14 DIAGNOSIS — K2100 Gastro-esophageal reflux disease with esophagitis, without bleeding: Secondary | ICD-10-CM | POA: Diagnosis not present

## 2021-03-14 MED ORDER — PANTOPRAZOLE SODIUM 40 MG PO TBEC: 40.0000 mg | DELAYED_RELEASE_TABLET | Freq: Every day | ORAL | 11 refills | Status: DC

## 2021-03-14 NOTE — Progress Notes (Signed)
Primary Care Physician: Sharilyn Sites, MD  Primary Gastroenterologist:  Garfield Cornea, MD   Chief Complaint  Patient presents with  . Abdominal Pain    None as of now, no nausea, no vomiting    HPI: Luis Gomez is a 65 y.o. male here for follow-up.  Does speak English fairly well but potential for some language barrier.  He presents with formal interpreter today.  Patient has a history of diverticulitis, reflux esophagitis.  Since his last office visit he completed a colonoscopy.  Noted to have diverticulosis, 2 polyps removed.  See below for details.  Previously abnormal CT findings to be secondary to diverticulitis which resolved.  Clinically doing well.  Denies abdominal pain.  Heartburn well controlled on pantoprazole 40 mg daily.  No dysphagia.  No vomiting.  No abdominal pain.  Bowel movements are regular.  No blood in stool or melena.  No unintentional weight loss.  EGD May 2021:  -Severe erosive/ulcerative reflux esophagitis extending up to 8 cm from the GE junction. -Noncritical Schatzki ring not manipulated. -Medium-sized hiatal hernia.  Colonoscopy November 2021: -Diverticulosis in the sigmoid colon and in the descending colon. -Two 3 to 8 mm polyps in the ascending colon and in the cecum, removed with a cold snare. Resected and retrieved.  Pathology revealed sessile serrated polyp, tubular adenoma. -The examination was otherwise normal on direct and retroflexion views. I suspect prior abdominal pain and abnormal CT findings secondary to diverticulitis which is resolved. -Next colonoscopy in 5 years.  Current Outpatient Medications  Medication Sig Dispense Refill  . [name] 150-150-200-10 MG TABS tablet TAKE 1 TABLET BY MOUTH DAILY 30 tablet 0  . pantoprazole (PROTONIX) 40 MG tablet Take 40 mg by mouth daily.    . rosuvastatin (CRESTOR) 10 MG tablet Take 10 mg by mouth daily.     No current facility-administered medications for this visit.     Allergies as of 03/14/2021  . (No Known Allergies)    ROS:  General: Negative for anorexia, weight loss, fever, chills, fatigue, weakness. ENT: Negative for hoarseness, difficulty swallowing , nasal congestion. CV: Negative for chest pain, angina, palpitations, dyspnea on exertion, peripheral edema.  Respiratory: Negative for dyspnea at rest, dyspnea on exertion, cough, sputum, wheezing.  GI: See history of present illness. GU:  Negative for dysuria, hematuria, urinary incontinence, urinary frequency, nocturnal urination.  Endo: Negative for unusual weight change.    Physical Examination:   BP 140/84   Pulse 77   Temp (!) 97.3 F (36.3 C)   Ht 5\' 6"  (1.676 m)   Wt 195 lb 6.4 oz (88.6 kg)   BMI 31.54 kg/m   General: Well-nourished, well-developed in no acute distress.  Eyes: No icterus. Mouth: masked Lungs: Clear to auscultation bilaterally.  Heart: Regular rate and rhythm, no murmurs rubs or gallops.  Abdomen: Bowel sounds are normal, nontender, nondistended, no hepatosplenomegaly or masses, no abdominal bruits or hernia , no rebound or guarding.   Extremities: No lower extremity edema. No clubbing or deformities. Neuro: Alert and oriented x 4   Skin: Warm and dry, no jaundice.   Psych: Alert and cooperative, normal mood and affect.  Labs:  Lab Results  Component Value Date   CREATININE 1.00 08/12/2020   BUN 14 08/12/2020   NA 139 08/12/2020   K 4.3 08/12/2020   CL 101 08/12/2020   CO2 30 08/12/2020   Lab Results  Component Value Date   ALT 30 08/12/2020   AST 17  08/12/2020   ALKPHOS 71 03/31/2020   BILITOT 0.5 08/12/2020   Lab Results  Component Value Date   WBC 5.8 08/12/2020   HGB 14.8 08/12/2020   HCT 44.9 08/12/2020   MCV 84.6 08/12/2020   PLT 285 08/12/2020    Imaging Studies: No results found.   Assessment: Pleasant 65 year old Hispanic male presenting for follow-up of reflux esophagitis, diverticulitis, abdominal pain.  GERD:  Well-controlled on daily PPI.  Diverticulosis: CT last year with long segmental thickening of the sigmoid colon, colonoscopy completed which was unremarkable.  Possibly episode of diverticulitis which resolved.  Recommend Benefiber 2 teaspoons daily.  Abdominal pain: Resolved.  History of colonic polyps: Will need a 5-year surveillance colonoscopy.  Discussed with patient today who voiced understanding.  Plan: 1. Pantoprazole 40mg  once daily.  2. Return to the office in one year or call sooner if needed.

## 2021-03-14 NOTE — Patient Instructions (Addendum)
1. Continue pantoprazole 40 mg daily before breakfast for acid reflux. 2. For diverticulosis, recommend Benefiber 2 teaspoons daily. 3. Your next colonoscopy will be in November 2026. 4. Return to the office in 1 year or call sooner if needed.    Contine pantoprazol 40 mg al da antes del desayuno para el reflujo cido.  Para la diverticulosis, recomendamos Benefiber 2 cucharaditas diarias.  Su prxima colonoscopia ser en noviembre de 2026.  Regrese a la oficina en 1 ao o llame antes si es necesario.

## 2021-03-15 NOTE — Progress Notes (Signed)
Cc'ed to pcp °

## 2021-04-04 ENCOUNTER — Other Ambulatory Visit: Payer: Self-pay | Admitting: Pharmacist

## 2021-04-04 ENCOUNTER — Other Ambulatory Visit (HOSPITAL_COMMUNITY): Payer: Self-pay

## 2021-04-04 MED ORDER — GENVOYA 150-150-200-10 MG PO TABS
1.0000 | ORAL_TABLET | Freq: Every day | ORAL | 1 refills | Status: DC
  Filled 2021-04-04: qty 30, 30d supply, fill #0
  Filled 2021-05-02: qty 30, 30d supply, fill #1

## 2021-04-05 DIAGNOSIS — Z419 Encounter for procedure for purposes other than remedying health state, unspecified: Secondary | ICD-10-CM | POA: Diagnosis not present

## 2021-04-10 ENCOUNTER — Other Ambulatory Visit (HOSPITAL_COMMUNITY): Payer: Self-pay

## 2021-05-02 ENCOUNTER — Other Ambulatory Visit (HOSPITAL_COMMUNITY): Payer: Self-pay

## 2021-05-02 DIAGNOSIS — N410 Acute prostatitis: Secondary | ICD-10-CM | POA: Diagnosis not present

## 2021-05-02 DIAGNOSIS — Z6831 Body mass index (BMI) 31.0-31.9, adult: Secondary | ICD-10-CM | POA: Diagnosis not present

## 2021-05-02 DIAGNOSIS — E6609 Other obesity due to excess calories: Secondary | ICD-10-CM | POA: Diagnosis not present

## 2021-05-05 ENCOUNTER — Other Ambulatory Visit: Payer: Medicaid Other

## 2021-05-05 ENCOUNTER — Other Ambulatory Visit: Payer: Self-pay | Admitting: Infectious Diseases

## 2021-05-05 ENCOUNTER — Other Ambulatory Visit (HOSPITAL_COMMUNITY)
Admission: RE | Admit: 2021-05-05 | Discharge: 2021-05-05 | Disposition: A | Discharge status: Home / Self Care | Payer: Medicaid Other | Source: Ambulatory Visit | Attending: Infectious Diseases | Admitting: Infectious Diseases

## 2021-05-05 ENCOUNTER — Telehealth: Payer: Self-pay

## 2021-05-05 ENCOUNTER — Other Ambulatory Visit: Payer: Self-pay

## 2021-05-05 DIAGNOSIS — B20 Human immunodeficiency virus [HIV] disease: Secondary | ICD-10-CM | POA: Diagnosis not present

## 2021-05-05 DIAGNOSIS — Z113 Encounter for screening for infections with a predominantly sexual mode of transmission: Secondary | ICD-10-CM

## 2021-05-05 DIAGNOSIS — Z419 Encounter for procedure for purposes other than remedying health state, unspecified: Secondary | ICD-10-CM | POA: Diagnosis not present

## 2021-05-05 DIAGNOSIS — Z79899 Other long term (current) drug therapy: Secondary | ICD-10-CM | POA: Diagnosis not present

## 2021-05-05 NOTE — Telephone Encounter (Signed)
Patient here for labs, asking to speak to a nurse about medications. He was recently prescribed ciprofloxacin and also takes Valtrex. He wants to make sure it is okay to take both of these together. RN spoke with Magda Kiel, [place] who confirmed for patient that he can take both. Patient verbalized understanding and has no further questions.   Beryle Flock, RN

## 2021-05-09 ENCOUNTER — Other Ambulatory Visit (HOSPITAL_COMMUNITY): Payer: Self-pay

## 2021-05-09 LAB — LIPID PANEL
Cholesterol: 163 mg/dL (ref <200)
HDL: 35 mg/dL — ABNORMAL LOW (ref >=40)
LDL Cholesterol (Calc): 102 mg/dL (calc) — ABNORMAL HIGH
Non-HDL Cholesterol (Calc): 128 mg/dL (calc) (ref <130)
Total CHOL/HDL Ratio: 4.7 (calc) (ref <5.0)
Triglycerides: 154 mg/dL — ABNORMAL HIGH (ref <150)

## 2021-05-09 LAB — URINE CYTOLOGY ANCILLARY ONLY
Chlamydia: NEGATIVE
Comment: NEGATIVE
Comment: NORMAL
Neisseria Gonorrhea: NEGATIVE

## 2021-05-09 LAB — CBC
HCT: 45.4 % (ref 38.5–50.0)
Hemoglobin: 14.4 g/dL (ref 13.2–17.1)
MCH: 26.7 pg — ABNORMAL LOW (ref 27.0–33.0)
MCHC: 31.7 g/dL — ABNORMAL LOW (ref 32.0–36.0)
MCV: 84.1 fL (ref 80.0–100.0)
MPV: 9.3 fL (ref 7.5–12.5)
Platelets: 296 10*3/uL (ref 140–400)
RBC: 5.4 10*6/uL (ref 4.20–5.80)
RDW: 12.7 % (ref 11.0–15.0)
WBC: 5.9 10*3/uL (ref 3.8–10.8)

## 2021-05-09 LAB — COMPREHENSIVE METABOLIC PANEL
AG Ratio: 2 (calc) (ref 1.0–2.5)
ALT: 31 U/L (ref 9–46)
AST: 23 U/L (ref 10–35)
Albumin: 4.6 g/dL (ref 3.6–5.1)
Alkaline phosphatase (APISO): 79 U/L (ref 35–144)
BUN: 14 mg/dL (ref 7–25)
CO2: 27 mmol/L (ref 20–32)
Calcium: 9.6 mg/dL (ref 8.6–10.3)
Chloride: 103 mmol/L (ref 98–110)
Creat: 1.06 mg/dL (ref 0.70–1.25)
Globulin: 2.3 g/dL (calc) (ref 1.9–3.7)
Glucose, Bld: 114 mg/dL — ABNORMAL HIGH (ref 65–99)
Potassium: 4.2 mmol/L (ref 3.5–5.3)
Sodium: 139 mmol/L (ref 135–146)
Total Bilirubin: 0.5 mg/dL (ref 0.2–1.2)
Total Protein: 6.9 g/dL (ref 6.1–8.1)

## 2021-05-09 LAB — T-HELPER CELLS (CD4) COUNT (NOT AT ARMC)
Absolute CD4: 715 cells/uL (ref 490–1740)
CD4 T Helper %: 26 % — ABNORMAL LOW (ref 30–61)
Total lymphocyte count: 2717 cells/uL (ref 850–3900)

## 2021-05-09 LAB — HIV-1 RNA QUANT-NO REFLEX-BLD
HIV 1 RNA Quant: NOT DETECTED Copies/mL
HIV-1 RNA Quant, Log: NOT DETECTED Log cps/mL

## 2021-05-09 LAB — RPR: RPR Ser Ql: NONREACTIVE

## 2021-05-19 ENCOUNTER — Ambulatory Visit (INDEPENDENT_AMBULATORY_CARE_PROVIDER_SITE_OTHER): Payer: Medicaid Other | Admitting: Infectious Diseases

## 2021-05-19 ENCOUNTER — Other Ambulatory Visit: Payer: Self-pay

## 2021-05-19 VITALS — BP 143/90 | HR 90 | Temp 97.4°F | Wt 190.0 lb

## 2021-05-19 DIAGNOSIS — E1329 Other specified diabetes mellitus with other diabetic kidney complication: Secondary | ICD-10-CM

## 2021-05-19 DIAGNOSIS — E1365 Other specified diabetes mellitus with hyperglycemia: Secondary | ICD-10-CM

## 2021-05-19 DIAGNOSIS — Z79899 Other long term (current) drug therapy: Secondary | ICD-10-CM | POA: Diagnosis not present

## 2021-05-19 DIAGNOSIS — E782 Mixed hyperlipidemia: Secondary | ICD-10-CM

## 2021-05-19 DIAGNOSIS — IMO0002 Reserved for concepts with insufficient information to code with codable children: Secondary | ICD-10-CM

## 2021-05-19 DIAGNOSIS — B20 Human immunodeficiency virus [HIV] disease: Secondary | ICD-10-CM | POA: Diagnosis not present

## 2021-05-19 DIAGNOSIS — Z113 Encounter for screening for infections with a predominantly sexual mode of transmission: Secondary | ICD-10-CM | POA: Diagnosis not present

## 2021-05-19 NOTE — Assessment & Plan Note (Signed)
He is doing well Continues to be borderline.  On no rx.

## 2021-05-19 NOTE — Patient Instructions (Signed)
[facility] Address: [place], [place],  [address]

## 2021-05-19 NOTE — Assessment & Plan Note (Signed)
He is doing well Reviewed his meds and labs with him (and interpreter).  Given condoms Shingles vax.  Will see him back in 9 months with labs prior.

## 2021-05-19 NOTE — Assessment & Plan Note (Signed)
Doing much better on rosuvastatin.  Encourage him to continue and exercise.

## 2021-05-19 NOTE — Progress Notes (Signed)
   Subjective:    Patient ID: Luis Gomez, male  DOB: 09/22/1956, 65 y.o.        MRN: [id number]   HPI 65 yo M from [place] in [name] since 1982, with HIV+, on atripla --> genvoya (04-2015). Also hx of GERD, elevated Glc. Had colonoscopy in 2019. Is scheduled for repeat next month due to polyps. He was in hospital 5-27/28 for CP and nausea. He ruled out and was presumed to have GI source (relieved with cocktail). NL TTE Was seen for Colon 11-20201, GERD visit 03-2021.No new rx and no new sx.  Denies missed rx.   Has lost wt through exercise.  Had COVID vax last May.   HIV 1 RNA Quant  Date Value  05/05/2021 Not Detected Copies/mL  08/12/2020 <20 Copies/mL (H)  10/09/2019 <20 DETECTED copies/mL (A)   CD4 T Cell Abs (/uL)  Date Value  08/12/2020 751  10/09/2019 946  12/31/2018 900     Health Maintenance  Topic Date Due  . COVID-19 Vaccine (1) Never done  . FOOT EXAM  Never done  . OPHTHALMOLOGY EXAM  Never done  . URINE MICROALBUMIN  Never done  . Zoster Vaccines- Shingrix (1 of 2) Never done  . HEMOGLOBIN A1C  09/09/2014  . INFLUENZA VACCINE  06/05/2021  . Pneumococcal Vaccine 85-66 Years old (4 - PPSV23 or PCV20) 08/26/2025  . TETANUS/TDAP  05/01/2026  . COLONOSCOPY (Pts 45-35yrs Insurance coverage will need to be confirmed)  09/14/2030  . Hepatitis C Screening  Completed  . HIV Screening  Completed  . HPV VACCINES  Aged Out      Review of Systems  Constitutional:  Negative for fever and weight loss.  Respiratory:  Negative for cough and shortness of breath.   Gastrointestinal:  Negative for constipation and diarrhea.  Genitourinary:  Negative for dysuria.  Psychiatric/Behavioral:  The patient does not have insomnia.    Please see HPI. All other systems reviewed and negative.     Objective:  Physical Exam Constitutional:      Appearance: Normal appearance. He is obese.  HENT:     Mouth/Throat:     Mouth: Mucous membranes are moist.     Pharynx: No  oropharyngeal exudate.  Eyes:     Extraocular Movements: Extraocular movements intact.     Pupils: Pupils are equal, round, and reactive to light.  Cardiovascular:     Rate and Rhythm: Normal rate and regular rhythm.  Pulmonary:     Effort: Pulmonary effort is normal.     Breath sounds: Normal breath sounds.  Abdominal:     General: Bowel sounds are normal. There is no distension.     Palpations: Abdomen is soft.     Tenderness: There is no abdominal tenderness.  Musculoskeletal:        General: Normal range of motion.     Cervical back: Normal range of motion and neck supple.     Right lower leg: No edema.     Left lower leg: No edema.  Neurological:     General: No focal deficit present.     Mental Status: He is alert.          Assessment & Plan:

## 2021-05-31 ENCOUNTER — Other Ambulatory Visit (HOSPITAL_COMMUNITY): Payer: Self-pay

## 2021-05-31 ENCOUNTER — Other Ambulatory Visit: Payer: Self-pay | Admitting: Infectious Diseases

## 2021-05-31 DIAGNOSIS — B20 Human immunodeficiency virus [HIV] disease: Secondary | ICD-10-CM

## 2021-05-31 MED ORDER — GENVOYA 150-150-200-10 MG PO TABS
1.0000 | ORAL_TABLET | Freq: Every day | ORAL | 8 refills | Status: DC
  Filled 2021-05-31: qty 30, 30d supply, fill #0
  Filled 2021-06-27: qty 30, 30d supply, fill #1
  Filled 2021-07-25: qty 30, 30d supply, fill #2
  Filled 2021-08-22: qty 30, 30d supply, fill #3
  Filled 2021-09-19: qty 30, 30d supply, fill #4
  Filled 2021-10-26: qty 30, 30d supply, fill #5
  Filled 2021-11-21: qty 30, 30d supply, fill #6
  Filled 2021-12-14: qty 30, 30d supply, fill #7
  Filled 2022-01-12: qty 30, 30d supply, fill #8

## 2021-06-01 ENCOUNTER — Other Ambulatory Visit (HOSPITAL_COMMUNITY): Payer: Self-pay

## 2021-06-27 ENCOUNTER — Other Ambulatory Visit (HOSPITAL_COMMUNITY): Payer: Self-pay

## 2021-06-29 ENCOUNTER — Other Ambulatory Visit (HOSPITAL_COMMUNITY): Payer: Self-pay

## 2021-07-06 DIAGNOSIS — Z6830 Body mass index (BMI) 30.0-30.9, adult: Secondary | ICD-10-CM | POA: Diagnosis not present

## 2021-07-06 DIAGNOSIS — R1013 Epigastric pain: Secondary | ICD-10-CM | POA: Diagnosis not present

## 2021-07-06 DIAGNOSIS — E6609 Other obesity due to excess calories: Secondary | ICD-10-CM | POA: Diagnosis not present

## 2021-07-25 ENCOUNTER — Other Ambulatory Visit (HOSPITAL_COMMUNITY): Payer: Self-pay

## 2021-07-27 ENCOUNTER — Other Ambulatory Visit (HOSPITAL_COMMUNITY): Payer: Self-pay

## 2021-07-27 IMAGING — CT CT CHEST W/ CM
2 of 6 series · 12 of 36 positions shown, 15 images · IV contrast (Omnipaque or Isovue)
Comparison: Chest radiograph 03/31/2020, CT 06/05/2011

CLINICAL DATA: Epigastric and mid chest pain

EXAM:
CT CHEST, ABDOMEN, AND PELVIS WITH CONTRAST
TECHNIQUE: Multidetector CT imaging of the chest, abdomen and pelvis was
performed following the standard protocol during bolus
administration of intravenous contrast.
CONTRAST:  100mL OMNIPAQUE IOHEXOL 300 MG/ML  SOLN

[Series 4: thins · axial · 0.98mm/px · z∈[+1013,+1513]mm · 9 of 894 slices shown, 12 images]
[im 90/894  mediastinal]
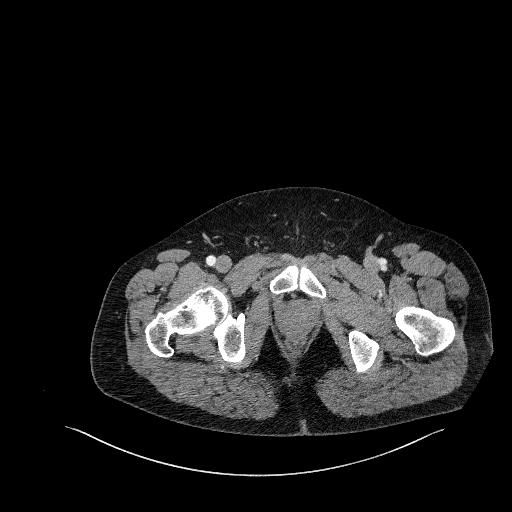
[im 90/894  lung]
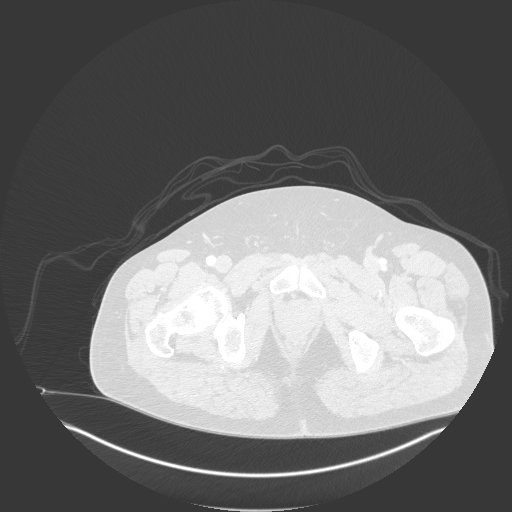
[im 179/894  lung]
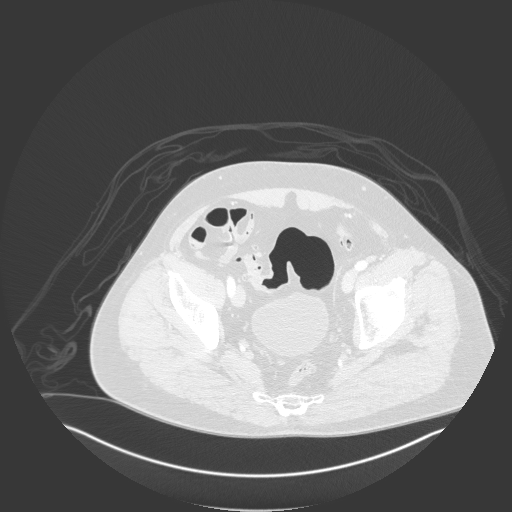
[im 268/894  lung]
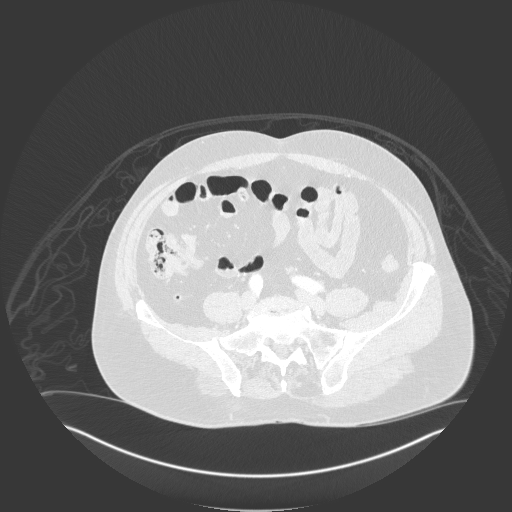
[im 358/894  lung]
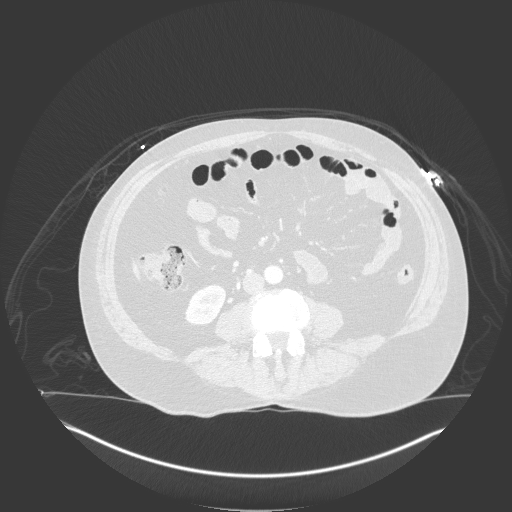
[im 447/894  mediastinal]
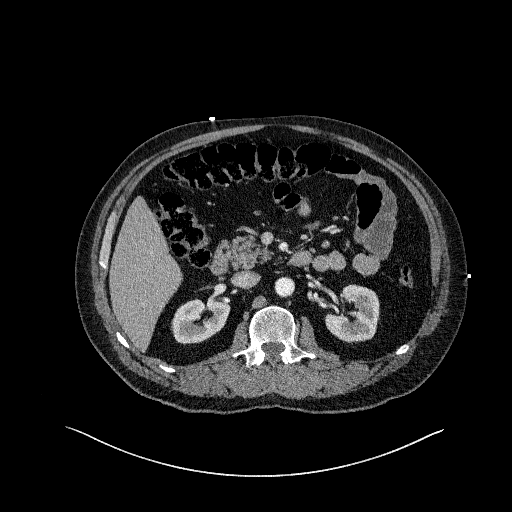
[im 447/894  lung]
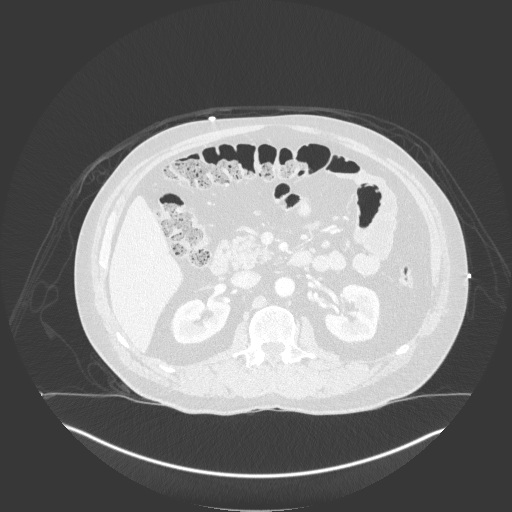
[im 536/894  lung]
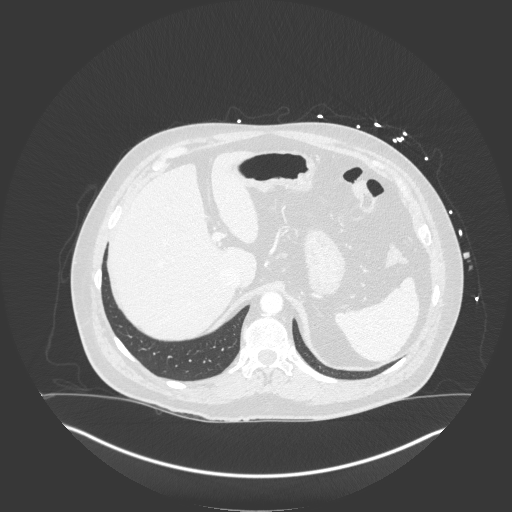
[im 626/894  lung]
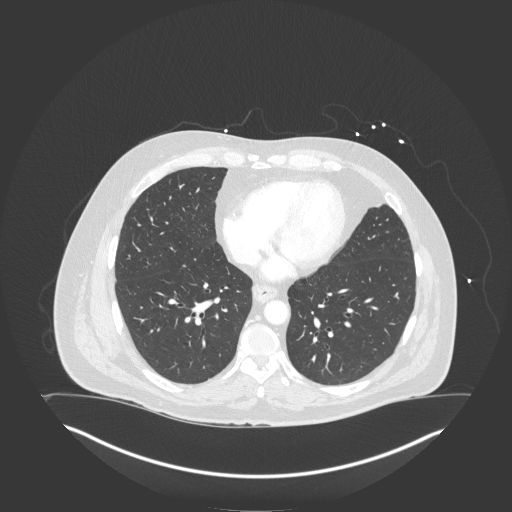
[im 715/894  lung]
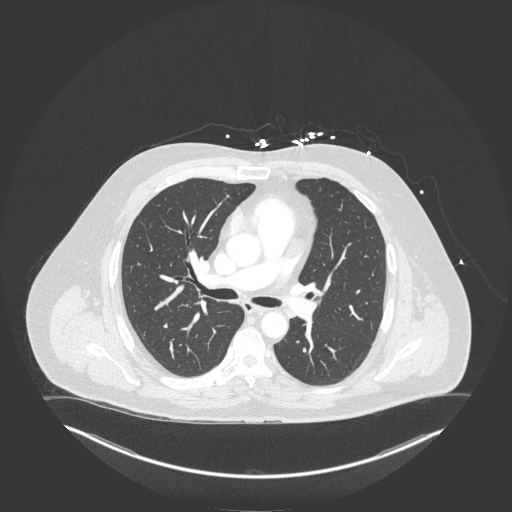
[im 804/894  mediastinal]
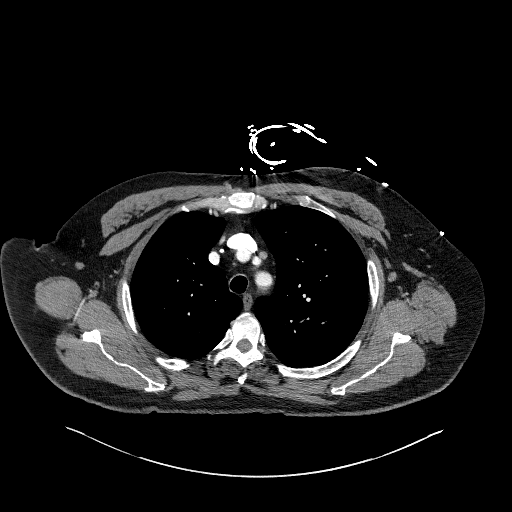
[im 804/894  lung]
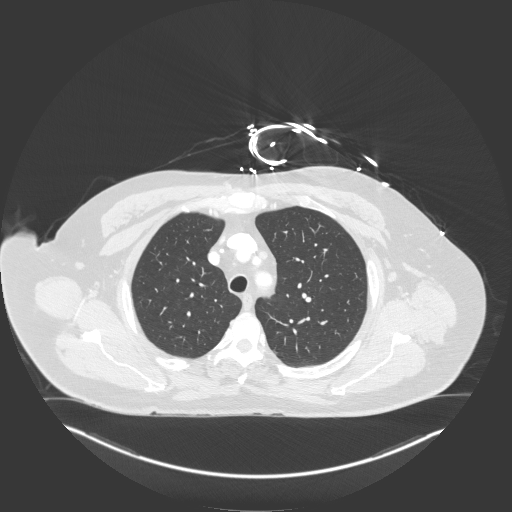

[Series 6: coronals · coronal · 0.82mm/px · 3 of 158 slices shown]
[im 32/158  lung]
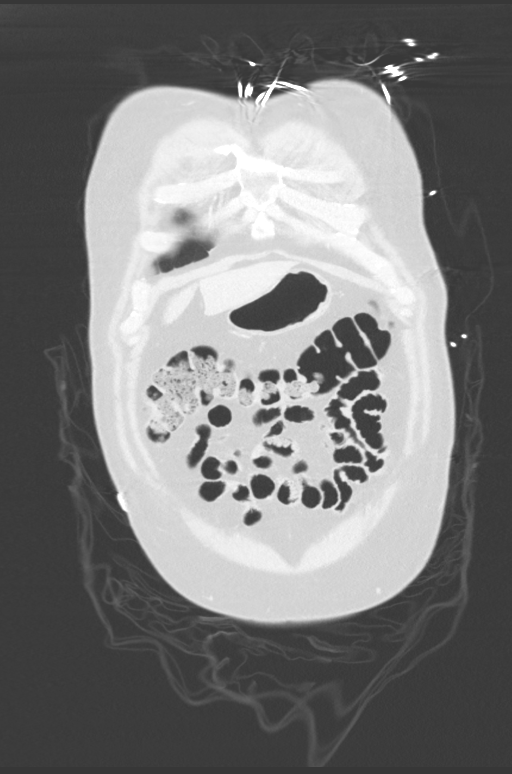
[im 63/158  lung]
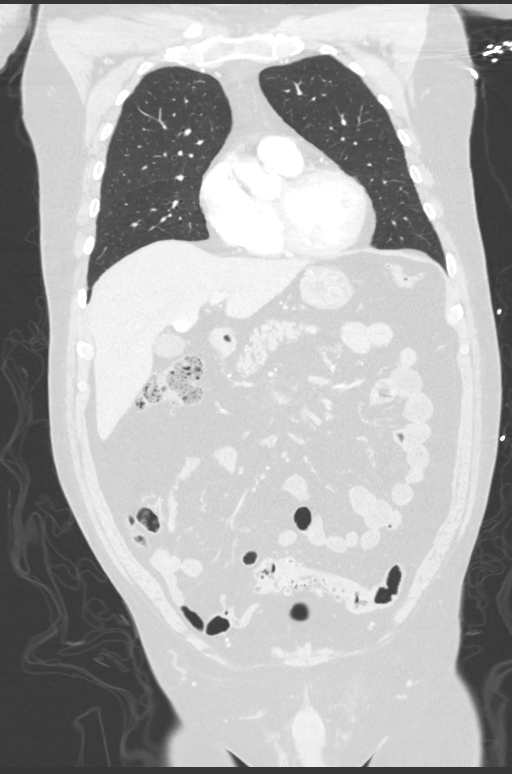
[im 95/158  lung]
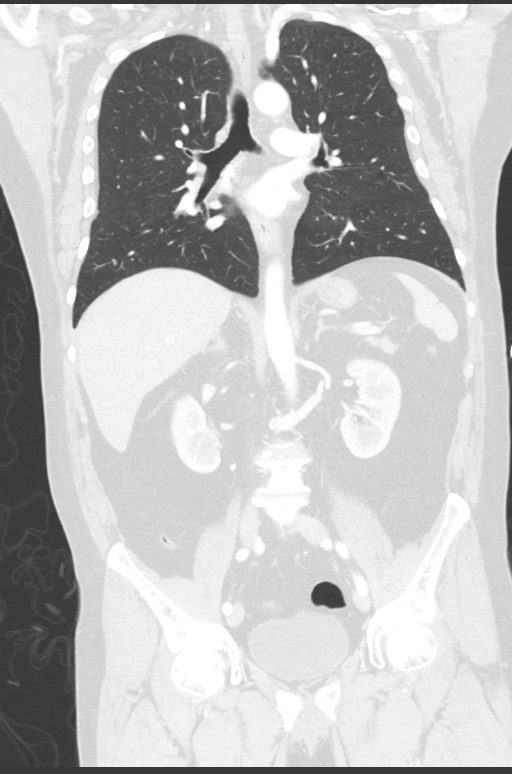

[12 of 36 positions shown; findings below may reference images not displayed]

FINDINGS: CT CHEST FINDINGS

Cardiovascular: Normal heart size. No pericardial effusion.
Calcifications seen left coronary artery. The aorta is normal
caliber. Normal 3 vessel branching of the aortic arch. Proximal
great vessels are unremarkable. Central pulmonary arteries are
normal caliber. No large central or lobar filling defects on this
non tailored examination pulmonary arteries.

Mediastinum/Nodes: Normal thyroid gland and thoracic inlet. No acute
abnormality of the trachea. Mild distal esophageal thickening
without paraesophageal, fluid or gas. Few calcified mediastinal and
hilar nodes are present, likely sequela of remote granulomatous
disease. No worrisome mediastinal, hilar or axillary adenopathy.

Lungs/Pleura: Few calcified granulomata in the lungs (3/100). No
suspicious pulmonary nodules or masses. No consolidation, features
of edema, pneumothorax, or effusion.

Musculoskeletal: Benign-appearing sclerotic focus in the proximal
right humerus, partially collimated from cross-sectional imaging. No
acute osseous abnormality or suspicious osseous lesion. No
suspicious chest wall abnormalities.

CT ABDOMEN PELVIS FINDINGS

Hepatobiliary: No worrisome focal liver lesions. Smooth liver
surface contour. Normal hepatic attenuation. Calcified gallstone
seen within the proximal body of the gallbladder. No pericholecystic
fluid or inflammation. No intraductal gallstones or biliary
dilatation.

Pancreas: Unremarkable. No pancreatic ductal dilatation or
surrounding inflammatory changes.

Spleen: Normal in size without focal abnormality.

Adrenals/Urinary Tract: Normal adrenal glands. Kidneys are normally
located with symmetric enhancement and excretion. Small focus of
scarring in the posterior left upper pole. No suspicious renal
lesion, urolithiasis or hydronephrosis. Urinary bladder is
unremarkable aside from mild indentation of the bladder base by the
prostate.

Stomach/Bowel: Distal esophageal thickening, as above. Stomach and
duodenum are unremarkable. No small bowel thickening or dilatation.
A normal appendix is visualized. No proximal colonic dilatation or
wall thickening. There are scattered distal colonic diverticula with
more long segmental thickening of the sigmoid colon but without
associated inflammation at this time.

Vascular/Lymphatic: Minimal plaque in the distal aorta and iliac
arteries. No other significant vascular findings. No suspicious or
enlarged lymph nodes in the included lymphatic chains.

Reproductive: Prostate at the upper limits of normal for size.
Coarse eccentric calcification of the prostate. No concerning
abnormalities of the prostate or seminal vesicles.

Other: No abdominopelvic free fluid or free gas. No bowel containing
hernias. Small fat containing left inguinal hernia.

Musculoskeletal: No acute osseous abnormality or suspicious osseous
lesion. Stable bone island in the left pubic body. Multilevel
degenerative changes are present in the imaged portions of the
spine.
IMPRESSION: 1. Mild distal esophageal thickening without paraesophageal, fluid
or gas. Findings may represent esophagitis, correlate with symptoms
and consider direct visualization.
2. Scattered distal colonic diverticula with segmental thickening of
the sigmoid without active inflammation at this time, may reflect
sequela of prior diverticular inflammation though should consider
correlation with prior colonoscopy if recently performed or direct
visualization on an outpatient basis.
3. No other acute findings in the chest, abdomen or pelvis.
4. Cholelithiasis without CT evidence of acute cholecystitis.
5. Aortic Atherosclerosis (EQHP7-ML6.6).
6. Coronary artery calcifications are present. Please note that the
presence of coronary artery calcium documents the presence of
coronary artery disease, the severity of this disease and any
potential stenosis cannot be assessed on this non-gated CT
examination. Assessment for potential risk factor modification,
dietary therapy or pharmacologic therapy may be warranted.

## 2021-08-22 ENCOUNTER — Other Ambulatory Visit (HOSPITAL_COMMUNITY): Payer: Self-pay

## 2021-08-24 ENCOUNTER — Other Ambulatory Visit (HOSPITAL_COMMUNITY): Payer: Self-pay

## 2021-09-19 ENCOUNTER — Other Ambulatory Visit (HOSPITAL_COMMUNITY): Payer: Self-pay

## 2021-09-25 ENCOUNTER — Other Ambulatory Visit (HOSPITAL_COMMUNITY): Payer: Self-pay

## 2021-10-23 ENCOUNTER — Other Ambulatory Visit (HOSPITAL_COMMUNITY): Payer: Self-pay

## 2021-10-26 ENCOUNTER — Other Ambulatory Visit (HOSPITAL_COMMUNITY): Payer: Self-pay

## 2021-10-31 DIAGNOSIS — J22 Unspecified acute lower respiratory infection: Secondary | ICD-10-CM | POA: Diagnosis not present

## 2021-11-08 ENCOUNTER — Other Ambulatory Visit (HOSPITAL_COMMUNITY): Payer: Self-pay

## 2021-11-21 ENCOUNTER — Other Ambulatory Visit (HOSPITAL_COMMUNITY): Payer: Self-pay

## 2021-11-22 ENCOUNTER — Other Ambulatory Visit (HOSPITAL_COMMUNITY): Payer: Self-pay

## 2021-12-14 ENCOUNTER — Other Ambulatory Visit (HOSPITAL_COMMUNITY): Payer: Self-pay

## 2021-12-19 ENCOUNTER — Other Ambulatory Visit (HOSPITAL_COMMUNITY): Payer: Self-pay

## 2022-01-12 ENCOUNTER — Other Ambulatory Visit (HOSPITAL_COMMUNITY): Payer: Self-pay

## 2022-01-15 ENCOUNTER — Other Ambulatory Visit (HOSPITAL_COMMUNITY): Payer: Self-pay

## 2022-02-05 ENCOUNTER — Other Ambulatory Visit (HOSPITAL_COMMUNITY): Payer: Self-pay

## 2022-02-05 ENCOUNTER — Other Ambulatory Visit: Payer: Self-pay | Admitting: Infectious Diseases

## 2022-02-05 DIAGNOSIS — B20 Human immunodeficiency virus [HIV] disease: Secondary | ICD-10-CM

## 2022-02-05 MED ORDER — GENVOYA 150-150-200-10 MG PO TABS
1.0000 | ORAL_TABLET | Freq: Every day | ORAL | 0 refills | Status: DC
  Filled 2022-02-05: qty 30, 30d supply, fill #0

## 2022-02-06 ENCOUNTER — Other Ambulatory Visit (HOSPITAL_COMMUNITY): Payer: Self-pay

## 2022-02-07 ENCOUNTER — Other Ambulatory Visit (HOSPITAL_COMMUNITY): Payer: Self-pay

## 2022-02-21 ENCOUNTER — Other Ambulatory Visit: Payer: Self-pay

## 2022-02-21 ENCOUNTER — Other Ambulatory Visit: Payer: Medicaid Other

## 2022-02-21 ENCOUNTER — Other Ambulatory Visit (HOSPITAL_COMMUNITY)
Admission: RE | Admit: 2022-02-21 | Discharge: 2022-02-21 | Disposition: A | Discharge status: Home / Self Care | Payer: Medicare Other | Source: Ambulatory Visit | Attending: Infectious Diseases | Admitting: Infectious Diseases

## 2022-02-21 DIAGNOSIS — Z113 Encounter for screening for infections with a predominantly sexual mode of transmission: Secondary | ICD-10-CM | POA: Diagnosis present

## 2022-02-21 DIAGNOSIS — Z79899 Other long term (current) drug therapy: Secondary | ICD-10-CM

## 2022-02-21 DIAGNOSIS — B20 Human immunodeficiency virus [HIV] disease: Secondary | ICD-10-CM

## 2022-02-22 LAB — URINE CYTOLOGY ANCILLARY ONLY
Chlamydia: NEGATIVE
Comment: NEGATIVE
Comment: NORMAL
Neisseria Gonorrhea: NEGATIVE

## 2022-02-24 LAB — CBC
HCT: 43.7 % (ref 38.5–50.0)
Hemoglobin: 14.2 g/dL (ref 13.2–17.1)
MCH: 27.1 pg (ref 27.0–33.0)
MCHC: 32.5 g/dL (ref 32.0–36.0)
MCV: 83.4 fL (ref 80.0–100.0)
MPV: 9.6 fL (ref 7.5–12.5)
Platelets: 296 10*3/uL (ref 140–400)
RBC: 5.24 10*6/uL (ref 4.20–5.80)
RDW: 13 % (ref 11.0–15.0)
WBC: 7.2 10*3/uL (ref 3.8–10.8)

## 2022-02-24 LAB — HELPER T-LYMPH-CD4 (ARMC ONLY)
% CD 4 Pos. Lymph.: 24.9 % — ABNORMAL LOW (ref 30.8–58.5)
Absolute CD 4 Helper: 822 /uL (ref 359–1519)
Basophils Absolute: 0.1 10*3/uL (ref 0.0–0.2)
Basos: 1 %
EOS (ABSOLUTE): 0.4 10*3/uL (ref 0.0–0.4)
Eos: 6 %
Hematocrit: 45.7 % (ref 37.5–51.0)
Hemoglobin: 14.5 g/dL (ref 13.0–17.7)
Immature Grans (Abs): 0 10*3/uL (ref 0.0–0.1)
Immature Granulocytes: 0 %
Lymphocytes Absolute: 3.3 10*3/uL — ABNORMAL HIGH (ref 0.7–3.1)
Lymphs: 48 %
MCH: 27 pg (ref 26.6–33.0)
MCHC: 31.7 g/dL (ref 31.5–35.7)
MCV: 85 fL (ref 79–97)
Monocytes Absolute: 0.4 10*3/uL (ref 0.1–0.9)
Monocytes: 6 %
Neutrophils Absolute: 2.7 10*3/uL (ref 1.4–7.0)
Neutrophils: 39 %
Platelets: 305 10*3/uL (ref 150–450)
RBC: 5.38 x10E6/uL (ref 4.14–5.80)
RDW: 12.9 % (ref 11.6–15.4)
WBC: 6.9 10*3/uL (ref 3.4–10.8)

## 2022-02-24 LAB — HIV-1 RNA QUANT-NO REFLEX-BLD
HIV 1 RNA Quant: <20 copies/mL — AB
HIV-1 RNA Quant, Log: <1.3 Log copies/mL — AB

## 2022-02-24 LAB — COMPREHENSIVE METABOLIC PANEL
AG Ratio: 2.1 (calc) (ref 1.0–2.5)
ALT: 65 U/L — ABNORMAL HIGH (ref 9–46)
AST: 36 U/L — ABNORMAL HIGH (ref 10–35)
Albumin: 4.7 g/dL (ref 3.6–5.1)
Alkaline phosphatase (APISO): 77 U/L (ref 35–144)
BUN: 12 mg/dL (ref 7–25)
CO2: 27 mmol/L (ref 20–32)
Calcium: 9 mg/dL (ref 8.6–10.3)
Chloride: 103 mmol/L (ref 98–110)
Creat: 1.08 mg/dL (ref 0.70–1.35)
Globulin: 2.2 g/dL (calc) (ref 1.9–3.7)
Glucose, Bld: 119 mg/dL — ABNORMAL HIGH (ref 65–99)
Potassium: 4.3 mmol/L (ref 3.5–5.3)
Sodium: 139 mmol/L (ref 135–146)
Total Bilirubin: 0.5 mg/dL (ref 0.2–1.2)
Total Protein: 6.9 g/dL (ref 6.1–8.1)

## 2022-02-24 LAB — LIPID PANEL
Cholesterol: 199 mg/dL (ref <200)
HDL: 36 mg/dL — ABNORMAL LOW (ref >=40)
LDL Cholesterol (Calc): 118 mg/dL (calc) — ABNORMAL HIGH
Non-HDL Cholesterol (Calc): 163 mg/dL (calc) — ABNORMAL HIGH (ref <130)
Total CHOL/HDL Ratio: 5.5 (calc) — ABNORMAL HIGH (ref <5.0)
Triglycerides: 290 mg/dL — ABNORMAL HIGH (ref <150)

## 2022-02-24 LAB — RPR: RPR Ser Ql: NONREACTIVE

## 2022-02-27 LAB — T-HELPER CELL (CD4) - (RCID CLINIC ONLY)

## 2022-03-01 ENCOUNTER — Other Ambulatory Visit (HOSPITAL_COMMUNITY): Payer: Self-pay

## 2022-03-07 ENCOUNTER — Other Ambulatory Visit (HOSPITAL_COMMUNITY): Payer: Self-pay

## 2022-03-07 ENCOUNTER — Encounter: Payer: Self-pay | Admitting: Internal Medicine

## 2022-03-08 ENCOUNTER — Other Ambulatory Visit: Payer: Self-pay

## 2022-03-08 ENCOUNTER — Ambulatory Visit (INDEPENDENT_AMBULATORY_CARE_PROVIDER_SITE_OTHER): Payer: Medicare Other | Admitting: Infectious Diseases

## 2022-03-08 ENCOUNTER — Encounter: Payer: Self-pay | Admitting: Infectious Diseases

## 2022-03-08 VITALS — BP 153/85 | HR 108 | Temp 98.0°F | Wt 207.8 lb

## 2022-03-08 DIAGNOSIS — B20 Human immunodeficiency virus [HIV] disease: Secondary | ICD-10-CM

## 2022-03-08 DIAGNOSIS — Z113 Encounter for screening for infections with a predominantly sexual mode of transmission: Secondary | ICD-10-CM

## 2022-03-08 DIAGNOSIS — Z23 Encounter for immunization: Secondary | ICD-10-CM | POA: Diagnosis not present

## 2022-03-08 DIAGNOSIS — E1165 Type 2 diabetes mellitus with hyperglycemia: Secondary | ICD-10-CM

## 2022-03-08 DIAGNOSIS — I10 Essential (primary) hypertension: Secondary | ICD-10-CM | POA: Diagnosis not present

## 2022-03-08 DIAGNOSIS — K2100 Gastro-esophageal reflux disease with esophagitis, without bleeding: Secondary | ICD-10-CM | POA: Diagnosis not present

## 2022-03-08 DIAGNOSIS — Z79899 Other long term (current) drug therapy: Secondary | ICD-10-CM | POA: Diagnosis not present

## 2022-03-08 MED ORDER — PANTOPRAZOLE SODIUM 40 MG PO TBEC: 40.0000 mg | DELAYED_RELEASE_TABLET | Freq: Every day | ORAL | 11 refills | Status: DC

## 2022-03-08 NOTE — Assessment & Plan Note (Addendum)
He is doing well ?We discussed his labs ?No change in ART ?PCV 20 ?Given condoms ?rtc in 9 months  ?Seen with interpreter ? ?

## 2022-03-08 NOTE — Assessment & Plan Note (Signed)
He continues on protonix.  ?asx ?

## 2022-03-08 NOTE — Assessment & Plan Note (Addendum)
Continues to have alternating high and normal BP ?Attributes his Bp to what he ate prior to visit and hurrying to get here.  ?Wants to come back in 9 months (vs 6) ?

## 2022-03-08 NOTE — Assessment & Plan Note (Signed)
His glc cont to be slightly elevated. Will continue to monitor.  ?

## 2022-03-08 NOTE — Addendum Note (Signed)
Addended by: Tomi Bamberger on: 03/08/2022 04:35 PM ? ? Modules accepted: Orders ? ?

## 2022-03-08 NOTE — Progress Notes (Signed)
? ?  Subjective:  ? ? Patient ID: Luis Gomez, male  DOB: 01-12-1956, 66 y.o.        MRN: [id number] ? ? ?HPI ?66 yo M from [place] in [name] since 1982, with HIV+, on atripla --> genvoya (04-2015). Also hx of GERD, elevated Glc. ?Had colonoscopy in 2019. Repeat 2021.  ?No problems with ART.  ?He had physical from his MD 1 month ago and was told "everything was fine". Don't eat too many candies or sweets.  ?Is taking protonix but has no GI issues.  ?He has been exercising to loose wt. Feels better but is eating more.  ? ?HIV 1 RNA Quant  ?Date Value  ?02/21/2022 <20 DETECTED copies/mL (A)  ?05/05/2021 Not Detected Copies/mL  ?08/12/2020 <20 Copies/mL (H)  ? ?CD4 T Cell Abs (/uL)  ?Date Value  ?08/12/2020 751  ?10/09/2019 946  ?12/31/2018 900  ? ? ? ?Health Maintenance  ?Topic Date Due  ?? FOOT EXAM  Never done  ?? OPHTHALMOLOGY EXAM  Never done  ?? URINE MICROALBUMIN  Never done  ?? Zoster Vaccines- Shingrix (1 of 2) Never done  ?? HEMOGLOBIN A1C  09/09/2014  ?? COVID-19 Vaccine (2 - Pfizer risk series) 04/26/2020  ?? INFLUENZA VACCINE  06/05/2022  ?? Pneumonia Vaccine 4+ Years old (29) 08/26/2025  ?? TETANUS/TDAP  05/01/2026  ?? COLONOSCOPY (Pts 45-30yr Insurance coverage will need to be confirmed)  09/14/2030  ?? Hepatitis C Screening  Completed  ?? HIV Screening  Completed  ?? HPV VACCINES  Aged Out  ? ? ? ? ?Review of Systems  ?Constitutional:  Negative for weight loss.  ?Respiratory:  Negative for cough and sputum production.   ?Cardiovascular:  Negative for chest pain.  ?Gastrointestinal:  Negative for constipation and diarrhea.  ?Genitourinary:  Negative for dysuria.  ?Neurological:  Negative for headaches.  ? ?Please see HPI. All other systems reviewed and negative. ? ?   ?Objective:  ?Physical Exam ?Vitals reviewed.  ?Constitutional:   ?   Appearance: He is obese.  ?HENT:  ?   Mouth/Throat:  ?   Mouth: Mucous membranes are moist.  ?   Pharynx: No oropharyngeal exudate.  ?Eyes:  ?   Extraocular Movements:  Extraocular movements intact.  ?   Pupils: Pupils are equal, round, and reactive to light.  ?Cardiovascular:  ?   Rate and Rhythm: Normal rate and regular rhythm.  ?Pulmonary:  ?   Effort: Pulmonary effort is normal.  ?   Breath sounds: Normal breath sounds.  ?Abdominal:  ?   General: Bowel sounds are normal. There is no distension.  ?   Palpations: Abdomen is soft.  ?   Tenderness: There is no abdominal tenderness.  ?Musculoskeletal:     ?   General: Normal range of motion.  ?   Cervical back: Normal range of motion and neck supple.  ?   Right lower leg: No edema.  ?   Left lower leg: No edema.  ?Neurological:  ?   General: No focal deficit present.  ?   Mental Status: He is alert.  ? ? ? ? ? ?   ?Assessment & Plan:  ? ?

## 2022-03-19 ENCOUNTER — Other Ambulatory Visit: Payer: Self-pay

## 2022-03-19 DIAGNOSIS — B20 Human immunodeficiency virus [HIV] disease: Secondary | ICD-10-CM

## 2022-03-19 MED ORDER — GENVOYA 150-150-200-10 MG PO TABS: 1.0000 | ORAL_TABLET | Freq: Every day | ORAL | 6 refills | Status: DC

## 2022-03-20 ENCOUNTER — Encounter: Payer: Self-pay | Admitting: Gastroenterology

## 2022-03-20 ENCOUNTER — Ambulatory Visit (INDEPENDENT_AMBULATORY_CARE_PROVIDER_SITE_OTHER): Payer: Medicare Other | Admitting: Gastroenterology

## 2022-03-20 DIAGNOSIS — K2100 Gastro-esophageal reflux disease with esophagitis, without bleeding: Secondary | ICD-10-CM | POA: Diagnosis not present

## 2022-03-20 MED ORDER — PANTOPRAZOLE SODIUM 40 MG PO TBEC: 40.0000 mg | DELAYED_RELEASE_TABLET | Freq: Every day | ORAL | 3 refills | Status: DC

## 2022-03-20 NOTE — Patient Instructions (Addendum)
?  Nos vemos en 2 a?os! ? ?Por favor llame con cualquier problema o inquietud! ? ??Su pr?xima colonoscopia es en 2026! ? ?Fue un Oceanographer. Arelia Sneddon crear relaciones de confianza con los pacientes para brindar atenci?n [place], [place] y de calidad. Valoro tus comentarios. Si recibe [facility] su visita, le agradezco mucho que se tome el tiempo para completarla. ? ?Dra. Annitta Needs, ANP-BC ?[facility] ? ? ? ? ? ? ? ?We will see you back in 2 years! ? ?Please call with any problems or concerns! ? ?Your next colonoscopy is in 2026! ? ?It was a pleasure to see you today. I want to create trusting relationships with patients to provide genuine, compassionate, and quality care. I value your feedback. If you receive a survey regarding your visit,  I greatly appreciate you taking time to fill this out.  ? ?Annitta Needs, PhD, ANP-BC ?[facility]  ? ?

## 2022-03-20 NOTE — Progress Notes (Signed)
follow

## 2022-03-20 NOTE — Progress Notes (Signed)
? ? ? ? ? ?Gastroenterology Office Note   ? ? ?Primary Care Physician:  Sharilyn Sites, MD  ?Primary Gastroenterologist: Dr. Gala Romney  ? ? ?Chief Complaint  ? ?Chief Complaint  ?Patient presents with  ? Follow-up  ? ? ? ?History of Present Illness  ? ?Luis Gomez is a 66 y.o. male presenting today in annual follow-up with a history of chronic GERD, esophagitis, and colon polyps with surveillance due in 2026. Spanish interpreter present today.  ? ?No abdominal pain, N/V, changes in bowel habits, constipation, diarrhea, overt GI bleeding, GERD, dysphagia, unexplained weight loss, lack of appetite, unexplained weight gain. Doing well on pantoprazole once daily.  ? ? ? ? ? ? ?EGD May 2021:  ?-Severe erosive/ulcerative reflux esophagitis extending up to 8 cm from the GE junction. ?-Noncritical Schatzki ring not manipulated. ?-Medium-sized hiatal hernia. ?  ?Colonoscopy November 2021: ?-Diverticulosis in the sigmoid colon and in the descending colon. ?-Two 3 to 8 mm polyps in the ascending colon and in the cecum, removed with a cold ?snare. Resected and retrieved.  Pathology revealed sessile serrated polyp, tubular adenoma. ?-The examination was otherwise normal on direct and retroflexion views. I suspect prior ?abdominal pain and abnormal CT findings secondary to diverticulitis which is resolved. ?-Next colonoscopy in 5 years. ? ? ? ? ?Past Medical History:  ?Diagnosis Date  ? Diverticulosis   ? Genital HSV   ? GERD (gastroesophageal reflux disease)   ? Last EGD->07/02/06 normal  ? HIV disease ([place])   ? S/P colonoscopy 07/02/2006  ? Left sided diverticula  ? ? ?Past Surgical History:  ?Procedure Laterality Date  ? BACK SURGERY    ? COLONOSCOPY  07/03/2006  ? Normal rectum, left-sided diverticulum  ? COLONOSCOPY N/A 02/07/2018  ? [place]k: Sessile serrated polyp removed from the colon, tubular adenoma.  Next colonoscopy in 5 years.  ? COLONOSCOPY N/A 09/14/2020  ? Diverticulosis, 2 polyps removed (sessile serrated polyp,  tubular adenoma), next colonoscopy in 5 years.  ? ESOPHAGOGASTRODUODENOSCOPY  07/03/2006  ? Normal esophagus, stomach, D1, D2.  ? ESOPHAGOGASTRODUODENOSCOPY N/A 04/01/2020  ? Rourk: Severe erosive/ulcerative reflux esophagitis extending up 8 cm from the GE junction.  Noncritical Schatzki ring not manipulated.  Medium sized hiatal hernia.  ? POLYPECTOMY  02/07/2018  ? Procedure: POLYPECTOMY;  Surgeon: Daneil Dolin, MD;  Location: [facility];  Service: Endoscopy;;  colon ?  ? POLYPECTOMY  09/14/2020  ? Procedure: POLYPECTOMY;  Surgeon: Daneil Dolin, MD;  Location: [facility];  Service: Endoscopy;;  ? ? ?Current Outpatient Medications  ?Medication Sig Dispense Refill  ? [name] 150-150-200-10 MG TABS tablet TAKE 1 TABLET BY MOUTH DAILY 30 tablet 6  ? Omega-3 Fatty Acids (FISH OIL) 1000 MG CAPS Take by mouth.    ? pantoprazole (PROTONIX) 40 MG tablet Take 1 tablet (40 mg total) by mouth daily before breakfast. 30 tablet 11  ? ?No current facility-administered medications for this visit.  ? ? ?Allergies as of 03/20/2022  ? (No Known Allergies)  ? ? ?No family history on file. ? ?Social History  ? ?Socioeconomic History  ? Marital status: Divorced  ?  Spouse name: Not on file  ? Number of children: 4  ? Years of education: Not on file  ? Highest education level: Not on file  ?Occupational History  ? Occupation: Equity  ?  Employer: [employer]  ?Tobacco Use  ? Smoking status: Never  ? Smokeless tobacco: Never  ?Vaping Use  ? Vaping Use: Never  used  ?Substance and Sexual Activity  ? Alcohol use: No  ? Drug use: No  ? Sexual activity: Not Currently  ?  Comment: pt. given condoms  ?Other Topics Concern  ? Not on file  ?Social History Narrative  ? Not on file  ? ?Social Determinants of Health  ? ?Financial Resource Strain: Not on file  ?Food Insecurity: Not on file  ?Transportation Needs: Not on file  ?Physical Activity: Not on file  ?Stress: Not on file  ?Social Connections: Not on file  ?Intimate Partner Violence:  Not on file  ? ? ? ?Review of Systems  ? ?Gen: Denies any fever, chills, fatigue, weight loss, lack of appetite.  ?CV: Denies chest pain, heart palpitations, peripheral edema, syncope.  ?Resp: Denies shortness of breath at rest or with exertion. Denies wheezing or cough.  ?GI: see HPI ?GU : Denies urinary burning, urinary frequency, urinary hesitancy ?MS: Denies joint pain, muscle weakness, cramps, or limitation of movement.  ?Derm: Denies rash, itching, dry skin ?Psych: Denies depression, anxiety, memory loss, and confusion ?Heme: Denies bruising, bleeding, and enlarged lymph nodes. ? ? ?Physical Exam  ? ?BP 120/64   Pulse 74   Temp (!) 97.5 ?F (36.4 ?C)   Ht 5' (1.524 m)   Wt 204 lb 3.2 oz (92.6 kg)   BMI 39.88 kg/m?  ?General:   Alert and oriented. Pleasant and cooperative. Well-nourished and well-developed.  ?Head:  Normocephalic and atraumatic. ?Eyes:  Without icterus ?Abdomen:  +BS, soft, non-tender and non-distended. No HSM noted. No guarding or rebound. No masses appreciated.  ?Rectal:  Deferred  ?Msk:  Symmetrical without gross deformities. Normal posture. ?Extremities:  Without edema. ?Neurologic:  Alert and  oriented x4;  grossly normal neurologically. ?Skin:  Intact without significant lesions or rashes. ?Psych:  Alert and cooperative. Normal mood and affect. ? ? ?Assessment  ? ?Luis Gomez is a 66 y.o. male presenting today in follow-up with a history of chronic GERD, esophagitis, and colon polyps. ? ?Doing well on pantoprazole once daily. He has no concerning lower or upper GI signs/symptoms. ? ?I will refill his pantoprazole with 90 day supply and 3 refills. Will see him back in 2 years barring any clinic changes.  ? ? ?PLAN  ? ?Pantoprazole daily ?Return in 2 years ?Colonoscopy 2026 ? ? ?Annitta Needs, PhD, ANP-BC ?[facility]  ? ? ?

## 2022-04-25 ENCOUNTER — Encounter: Payer: Self-pay | Admitting: Infectious Diseases

## 2022-05-01 ENCOUNTER — Other Ambulatory Visit: Payer: Self-pay | Admitting: Infectious Diseases

## 2022-05-01 ENCOUNTER — Other Ambulatory Visit (HOSPITAL_COMMUNITY): Payer: Self-pay

## 2022-05-01 DIAGNOSIS — B20 Human immunodeficiency virus [HIV] disease: Secondary | ICD-10-CM

## 2022-05-01 MED ORDER — GENVOYA 150-150-200-10 MG PO TABS
1.0000 | ORAL_TABLET | Freq: Every day | ORAL | 5 refills | Status: DC
  Filled 2022-05-01 – 2022-05-11 (×2): qty 30, 30d supply, fill #0
  Filled 2022-06-13: qty 30, 30d supply, fill #1
  Filled 2022-07-04: qty 30, 30d supply, fill #2
  Filled 2022-07-30: qty 30, 30d supply, fill #3
  Filled 2022-08-31: qty 30, 30d supply, fill #4
  Filled 2022-09-26: qty 30, 30d supply, fill #5

## 2022-05-11 ENCOUNTER — Other Ambulatory Visit (HOSPITAL_COMMUNITY): Payer: Self-pay

## 2022-05-14 ENCOUNTER — Other Ambulatory Visit (HOSPITAL_COMMUNITY): Payer: Self-pay

## 2022-05-16 ENCOUNTER — Other Ambulatory Visit (HOSPITAL_COMMUNITY): Payer: Self-pay

## 2022-06-11 ENCOUNTER — Other Ambulatory Visit (HOSPITAL_COMMUNITY): Payer: Self-pay

## 2022-06-13 ENCOUNTER — Other Ambulatory Visit (HOSPITAL_COMMUNITY): Payer: Self-pay

## 2022-07-04 ENCOUNTER — Other Ambulatory Visit (HOSPITAL_COMMUNITY): Payer: Self-pay

## 2022-07-10 ENCOUNTER — Other Ambulatory Visit (HOSPITAL_COMMUNITY): Payer: Self-pay

## 2022-07-30 ENCOUNTER — Other Ambulatory Visit (HOSPITAL_COMMUNITY): Payer: Self-pay

## 2022-08-09 ENCOUNTER — Other Ambulatory Visit (HOSPITAL_COMMUNITY): Payer: Self-pay

## 2022-08-31 ENCOUNTER — Other Ambulatory Visit (HOSPITAL_COMMUNITY): Payer: Self-pay

## 2022-09-03 ENCOUNTER — Other Ambulatory Visit (HOSPITAL_COMMUNITY): Payer: Self-pay

## 2022-09-26 ENCOUNTER — Other Ambulatory Visit (HOSPITAL_COMMUNITY): Payer: Self-pay

## 2022-10-01 ENCOUNTER — Other Ambulatory Visit (HOSPITAL_COMMUNITY): Payer: Self-pay

## 2022-10-22 ENCOUNTER — Other Ambulatory Visit: Payer: Self-pay

## 2022-10-22 ENCOUNTER — Other Ambulatory Visit: Payer: Self-pay | Admitting: Infectious Diseases

## 2022-10-22 ENCOUNTER — Other Ambulatory Visit (HOSPITAL_COMMUNITY): Payer: Self-pay

## 2022-10-22 DIAGNOSIS — B20 Human immunodeficiency virus [HIV] disease: Secondary | ICD-10-CM

## 2022-10-22 MED ORDER — GENVOYA 150-150-200-10 MG PO TABS
1.0000 | ORAL_TABLET | Freq: Every day | ORAL | 3 refills | Status: DC
  Filled 2022-10-22: qty 30, 30d supply, fill #0
  Filled 2022-11-19: qty 30, 30d supply, fill #1
  Filled 2022-12-12: qty 30, 30d supply, fill #2

## 2022-10-24 ENCOUNTER — Other Ambulatory Visit: Payer: Self-pay

## 2022-11-19 ENCOUNTER — Other Ambulatory Visit (HOSPITAL_COMMUNITY): Payer: Self-pay

## 2022-12-11 ENCOUNTER — Other Ambulatory Visit: Payer: Self-pay

## 2022-12-11 ENCOUNTER — Other Ambulatory Visit: Payer: 59

## 2022-12-11 DIAGNOSIS — E1165 Type 2 diabetes mellitus with hyperglycemia: Secondary | ICD-10-CM

## 2022-12-11 DIAGNOSIS — Z79899 Other long term (current) drug therapy: Secondary | ICD-10-CM

## 2022-12-11 DIAGNOSIS — Z711 Person with feared health complaint in whom no diagnosis is made: Secondary | ICD-10-CM | POA: Diagnosis not present

## 2022-12-11 DIAGNOSIS — Z113 Encounter for screening for infections with a predominantly sexual mode of transmission: Secondary | ICD-10-CM

## 2022-12-11 DIAGNOSIS — B20 Human immunodeficiency virus [HIV] disease: Secondary | ICD-10-CM

## 2022-12-12 ENCOUNTER — Other Ambulatory Visit (HOSPITAL_COMMUNITY): Payer: Self-pay

## 2022-12-12 LAB — T-HELPER CELL (CD4) - (RCID CLINIC ONLY)
CD4 % Helper T Cell: 25 % — ABNORMAL LOW (ref 33–65)
CD4 T Cell Abs: 765 /uL (ref 400–1790)

## 2022-12-13 LAB — COMPREHENSIVE METABOLIC PANEL
AG Ratio: 1.8 (calc) (ref 1.0–2.5)
ALT: 41 U/L (ref 9–46)
AST: 24 U/L (ref 10–35)
Albumin: 4.6 g/dL (ref 3.6–5.1)
Alkaline phosphatase (APISO): 78 U/L (ref 35–144)
BUN: 15 mg/dL (ref 7–25)
CO2: 28 mmol/L (ref 20–32)
Calcium: 9.5 mg/dL (ref 8.6–10.3)
Chloride: 101 mmol/L (ref 98–110)
Creat: 1.03 mg/dL (ref 0.70–1.35)
Globulin: 2.6 g/dL (calc) (ref 1.9–3.7)
Glucose, Bld: 128 mg/dL — ABNORMAL HIGH (ref 65–99)
Potassium: 4.2 mmol/L (ref 3.5–5.3)
Sodium: 137 mmol/L (ref 135–146)
Total Bilirubin: 0.3 mg/dL (ref 0.2–1.2)
Total Protein: 7.2 g/dL (ref 6.1–8.1)

## 2022-12-13 LAB — CBC
HCT: 45.5 % (ref 38.5–50.0)
Hemoglobin: 15.3 g/dL (ref 13.2–17.1)
MCH: 27.3 pg (ref 27.0–33.0)
MCHC: 33.6 g/dL (ref 32.0–36.0)
MCV: 81.1 fL (ref 80.0–100.0)
MPV: 9.2 fL (ref 7.5–12.5)
Platelets: 320 10*3/uL (ref 140–400)
RBC: 5.61 10*6/uL (ref 4.20–5.80)
RDW: 12.8 % (ref 11.0–15.0)
WBC: 7.1 10*3/uL (ref 3.8–10.8)

## 2022-12-13 LAB — HIV-1 RNA QUANT-NO REFLEX-BLD
HIV 1 RNA Quant: <20 Copies/mL — ABNORMAL HIGH
HIV-1 RNA Quant, Log: <1.3 Log cps/mL — ABNORMAL HIGH

## 2022-12-13 LAB — LIPID PANEL
Cholesterol: 215 mg/dL — ABNORMAL HIGH (ref <200)
HDL: 36 mg/dL — ABNORMAL LOW (ref >=40)
LDL Cholesterol (Calc): 139 mg/dL (calc) — ABNORMAL HIGH
Non-HDL Cholesterol (Calc): 179 mg/dL (calc) — ABNORMAL HIGH (ref <130)
Total CHOL/HDL Ratio: 6 (calc) — ABNORMAL HIGH (ref <5.0)
Triglycerides: 245 mg/dL — ABNORMAL HIGH (ref <150)

## 2022-12-13 LAB — RPR: RPR Ser Ql: NONREACTIVE

## 2022-12-17 DIAGNOSIS — D518 Other vitamin B12 deficiency anemias: Secondary | ICD-10-CM | POA: Diagnosis not present

## 2022-12-17 DIAGNOSIS — E1165 Type 2 diabetes mellitus with hyperglycemia: Secondary | ICD-10-CM | POA: Diagnosis not present

## 2022-12-17 DIAGNOSIS — E039 Hypothyroidism, unspecified: Secondary | ICD-10-CM | POA: Diagnosis not present

## 2022-12-17 DIAGNOSIS — Z0001 Encounter for general adult medical examination with abnormal findings: Secondary | ICD-10-CM | POA: Diagnosis not present

## 2022-12-17 DIAGNOSIS — E559 Vitamin D deficiency, unspecified: Secondary | ICD-10-CM | POA: Diagnosis not present

## 2022-12-17 DIAGNOSIS — I700 Atherosclerosis of aorta: Secondary | ICD-10-CM | POA: Diagnosis not present

## 2022-12-17 DIAGNOSIS — K219 Gastro-esophageal reflux disease without esophagitis: Secondary | ICD-10-CM | POA: Diagnosis not present

## 2022-12-19 ENCOUNTER — Other Ambulatory Visit: Payer: Self-pay

## 2023-01-01 ENCOUNTER — Other Ambulatory Visit: Payer: Self-pay

## 2023-01-01 ENCOUNTER — Encounter: Payer: Self-pay | Admitting: Internal Medicine

## 2023-01-01 ENCOUNTER — Ambulatory Visit (INDEPENDENT_AMBULATORY_CARE_PROVIDER_SITE_OTHER): Payer: 59 | Admitting: Internal Medicine

## 2023-01-01 VITALS — BP 160/90 | HR 99 | Temp 98.0°F | Ht 66.0 in | Wt 235.0 lb

## 2023-01-01 DIAGNOSIS — B20 Human immunodeficiency virus [HIV] disease: Secondary | ICD-10-CM

## 2023-01-01 DIAGNOSIS — Z113 Encounter for screening for infections with a predominantly sexual mode of transmission: Secondary | ICD-10-CM | POA: Diagnosis not present

## 2023-01-01 DIAGNOSIS — E782 Mixed hyperlipidemia: Secondary | ICD-10-CM | POA: Diagnosis not present

## 2023-01-01 MED ORDER — GENVOYA 150-150-200-10 MG PO TABS: 1.0000 | ORAL_TABLET | Freq: Every day | ORAL | 11 refills | Status: DC

## 2023-01-01 NOTE — Assessment & Plan Note (Signed)
He is doing well from this standpoint and no concerns.  Labs reviewed with him and no changes indicated.  He can continue with every 9 month follow up.

## 2023-01-01 NOTE — Assessment & Plan Note (Signed)
Screened negative 

## 2023-01-01 NOTE — Assessment & Plan Note (Signed)
He is monitored by his PCP and statin indication per his PCP.  Reviewed results from lipid panel and discussed lifestyle alterations.

## 2023-01-01 NOTE — Progress Notes (Signed)
   Subjective:    Patient ID: Luis Gomez, male    DOB: February 20, 1956, 67 y.o.   MRN: [id number]  HPI [name] is here for follow up of HIV He continues on [name] with no missed doses.  He has no complaints today.  No issues with getting or taking his medication.     Review of Systems  Constitutional:  Negative for fatigue.  Gastrointestinal:  Negative for diarrhea and nausea.  Skin:  Negative for rash.       Objective:   Physical Exam Eyes:     General: No scleral icterus. Pulmonary:     Effort: Pulmonary effort is normal.  Skin:    Findings: No rash.  Neurological:     Mental Status: He is alert.   SH: no tobacco        Assessment & Plan:

## 2023-01-10 ENCOUNTER — Other Ambulatory Visit: Payer: Self-pay

## 2023-01-10 ENCOUNTER — Other Ambulatory Visit: Payer: Self-pay | Admitting: Internal Medicine

## 2023-01-10 ENCOUNTER — Other Ambulatory Visit (HOSPITAL_COMMUNITY): Payer: Self-pay

## 2023-01-10 DIAGNOSIS — B20 Human immunodeficiency virus [HIV] disease: Secondary | ICD-10-CM

## 2023-01-10 MED ORDER — GENVOYA 150-150-200-10 MG PO TABS
1.0000 | ORAL_TABLET | Freq: Every day | ORAL | 3 refills | Status: DC
  Filled 2023-01-10: qty 30, 30d supply, fill #0
  Filled 2023-02-05: qty 30, 30d supply, fill #1
  Filled 2023-03-07: qty 30, 30d supply, fill #2
  Filled 2023-04-03: qty 30, 30d supply, fill #3

## 2023-01-10 NOTE — Telephone Encounter (Signed)
Secure chat sent to [facility] - warning for drug interaction. Waiting to hear back.

## 2023-01-15 ENCOUNTER — Other Ambulatory Visit (HOSPITAL_COMMUNITY): Payer: Self-pay

## 2023-02-05 ENCOUNTER — Other Ambulatory Visit (HOSPITAL_COMMUNITY): Payer: Self-pay

## 2023-02-08 ENCOUNTER — Other Ambulatory Visit (HOSPITAL_COMMUNITY): Payer: Self-pay

## 2023-03-07 ENCOUNTER — Other Ambulatory Visit: Payer: Self-pay

## 2023-03-12 ENCOUNTER — Other Ambulatory Visit: Payer: Self-pay

## 2023-03-23 ENCOUNTER — Other Ambulatory Visit: Payer: Self-pay | Admitting: Gastroenterology

## 2023-03-23 DIAGNOSIS — K2100 Gastro-esophageal reflux disease with esophagitis, without bleeding: Secondary | ICD-10-CM

## 2023-03-26 ENCOUNTER — Telehealth: Payer: Self-pay

## 2023-03-26 DIAGNOSIS — K2100 Gastro-esophageal reflux disease with esophagitis, without bleeding: Secondary | ICD-10-CM

## 2023-03-26 MED ORDER — PANTOPRAZOLE SODIUM 40 MG PO TBEC
40.0000 mg | DELAYED_RELEASE_TABLET | Freq: Every day | ORAL | 3 refills | Status: AC
Start: 2023-03-26 — End: ?

## 2023-03-26 NOTE — Addendum Note (Signed)
Addended by: Gelene Mink on: 03/26/2023 12:06 PM   Modules accepted: Orders

## 2023-03-26 NOTE — Telephone Encounter (Signed)
Pt needs a refill on his Pantoprazole. Last ov was  03/20/2022. You gave him 2 years before returning

## 2023-04-03 ENCOUNTER — Other Ambulatory Visit: Payer: Self-pay

## 2023-04-09 ENCOUNTER — Other Ambulatory Visit (HOSPITAL_COMMUNITY): Payer: Self-pay

## 2023-05-02 ENCOUNTER — Other Ambulatory Visit (HOSPITAL_COMMUNITY): Payer: Self-pay

## 2023-05-02 ENCOUNTER — Other Ambulatory Visit: Payer: Self-pay | Admitting: Internal Medicine

## 2023-05-02 DIAGNOSIS — B20 Human immunodeficiency virus [HIV] disease: Secondary | ICD-10-CM

## 2023-05-03 ENCOUNTER — Other Ambulatory Visit (HOSPITAL_COMMUNITY): Payer: Self-pay

## 2023-05-23 ENCOUNTER — Other Ambulatory Visit (HOSPITAL_COMMUNITY): Payer: Self-pay

## 2023-05-24 DIAGNOSIS — S335XXA Sprain of ligaments of lumbar spine, initial encounter: Secondary | ICD-10-CM | POA: Diagnosis not present

## 2023-05-24 DIAGNOSIS — M545 Low back pain, unspecified: Secondary | ICD-10-CM | POA: Diagnosis not present

## 2023-05-30 ENCOUNTER — Other Ambulatory Visit (HOSPITAL_COMMUNITY): Payer: Self-pay

## 2023-06-03 ENCOUNTER — Other Ambulatory Visit (HOSPITAL_COMMUNITY): Payer: Self-pay

## 2023-06-05 ENCOUNTER — Other Ambulatory Visit (HOSPITAL_COMMUNITY): Payer: Self-pay

## 2023-06-12 DIAGNOSIS — U071 COVID-19: Secondary | ICD-10-CM | POA: Diagnosis not present

## 2023-06-12 DIAGNOSIS — E1165 Type 2 diabetes mellitus with hyperglycemia: Secondary | ICD-10-CM | POA: Diagnosis not present

## 2023-06-20 ENCOUNTER — Other Ambulatory Visit (HOSPITAL_COMMUNITY): Payer: Self-pay

## 2023-07-12 DIAGNOSIS — K219 Gastro-esophageal reflux disease without esophagitis: Secondary | ICD-10-CM | POA: Diagnosis not present

## 2023-07-12 DIAGNOSIS — E1165 Type 2 diabetes mellitus with hyperglycemia: Secondary | ICD-10-CM | POA: Diagnosis not present

## 2023-07-12 DIAGNOSIS — E785 Hyperlipidemia, unspecified: Secondary | ICD-10-CM | POA: Diagnosis not present

## 2023-07-18 ENCOUNTER — Other Ambulatory Visit (HOSPITAL_COMMUNITY): Payer: Self-pay

## 2023-07-19 DIAGNOSIS — J069 Acute upper respiratory infection, unspecified: Secondary | ICD-10-CM | POA: Diagnosis not present

## 2023-07-19 DIAGNOSIS — R6889 Other general symptoms and signs: Secondary | ICD-10-CM | POA: Diagnosis not present

## 2023-07-19 NOTE — Progress Notes (Signed)
The 10-year ASCVD risk score (Arnett DK, et al., 2019) is: 41.5%   Values used to calculate the score:     Age: 67 years     Sex: Male     Is Non-Hispanic African American: No     Diabetic: Yes     Tobacco smoker: No     Systolic Blood Pressure: 160 mmHg     Is BP treated: No     HDL Cholesterol: 36 mg/dL     Total Cholesterol: 215 mg/dL  Sandie Ano, RN

## 2023-07-21 ENCOUNTER — Encounter (HOSPITAL_COMMUNITY): Payer: Self-pay | Admitting: Emergency Medicine

## 2023-07-21 ENCOUNTER — Other Ambulatory Visit: Payer: Self-pay

## 2023-07-21 ENCOUNTER — Inpatient Hospital Stay (HOSPITAL_COMMUNITY)
Admission: EM | Admit: 2023-07-21 | Discharge: 2023-07-24 | DRG: 392 | Disposition: A | Discharge status: Home / Self Care | Payer: 59 | Attending: Internal Medicine | Admitting: Internal Medicine

## 2023-07-21 DIAGNOSIS — E876 Hypokalemia: Secondary | ICD-10-CM | POA: Diagnosis not present

## 2023-07-21 DIAGNOSIS — Z79899 Other long term (current) drug therapy: Secondary | ICD-10-CM

## 2023-07-21 DIAGNOSIS — K5720 Diverticulitis of large intestine with perforation and abscess without bleeding: Principal | ICD-10-CM | POA: Diagnosis present

## 2023-07-21 DIAGNOSIS — K219 Gastro-esophageal reflux disease without esophagitis: Secondary | ICD-10-CM | POA: Diagnosis not present

## 2023-07-21 DIAGNOSIS — K5732 Diverticulitis of large intestine without perforation or abscess without bleeding: Secondary | ICD-10-CM | POA: Diagnosis present

## 2023-07-21 DIAGNOSIS — K5730 Diverticulosis of large intestine without perforation or abscess without bleeding: Secondary | ICD-10-CM | POA: Diagnosis not present

## 2023-07-21 DIAGNOSIS — Z23 Encounter for immunization: Secondary | ICD-10-CM

## 2023-07-21 DIAGNOSIS — E871 Hypo-osmolality and hyponatremia: Secondary | ICD-10-CM | POA: Diagnosis present

## 2023-07-21 DIAGNOSIS — E785 Hyperlipidemia, unspecified: Secondary | ICD-10-CM | POA: Diagnosis present

## 2023-07-21 DIAGNOSIS — K5792 Diverticulitis of intestine, part unspecified, without perforation or abscess without bleeding: Secondary | ICD-10-CM | POA: Diagnosis not present

## 2023-07-21 DIAGNOSIS — R6510 Systemic inflammatory response syndrome (SIRS) of non-infectious origin without acute organ dysfunction: Secondary | ICD-10-CM | POA: Diagnosis not present

## 2023-07-21 DIAGNOSIS — E782 Mixed hyperlipidemia: Secondary | ICD-10-CM | POA: Diagnosis not present

## 2023-07-21 DIAGNOSIS — N3289 Other specified disorders of bladder: Secondary | ICD-10-CM | POA: Diagnosis not present

## 2023-07-21 DIAGNOSIS — K2100 Gastro-esophageal reflux disease with esophagitis, without bleeding: Secondary | ICD-10-CM | POA: Diagnosis not present

## 2023-07-21 DIAGNOSIS — A6002 Herpesviral infection of other male genital organs: Secondary | ICD-10-CM | POA: Diagnosis present

## 2023-07-21 DIAGNOSIS — B20 Human immunodeficiency virus [HIV] disease: Secondary | ICD-10-CM | POA: Diagnosis present

## 2023-07-21 NOTE — ED Triage Notes (Signed)
Pt complains of left groin spasms started yesterday denies any injury. Denies urinary complaints. No n/v/d. Pt states pain is worse with sitting down

## 2023-07-22 ENCOUNTER — Other Ambulatory Visit (HOSPITAL_COMMUNITY): Payer: Self-pay

## 2023-07-22 ENCOUNTER — Emergency Department (HOSPITAL_COMMUNITY): Payer: 59

## 2023-07-22 DIAGNOSIS — E785 Hyperlipidemia, unspecified: Secondary | ICD-10-CM | POA: Diagnosis present

## 2023-07-22 DIAGNOSIS — B20 Human immunodeficiency virus [HIV] disease: Secondary | ICD-10-CM | POA: Diagnosis present

## 2023-07-22 DIAGNOSIS — K5720 Diverticulitis of large intestine with perforation and abscess without bleeding: Secondary | ICD-10-CM | POA: Diagnosis not present

## 2023-07-22 DIAGNOSIS — E876 Hypokalemia: Secondary | ICD-10-CM

## 2023-07-22 DIAGNOSIS — N3289 Other specified disorders of bladder: Secondary | ICD-10-CM | POA: Diagnosis not present

## 2023-07-22 DIAGNOSIS — K5732 Diverticulitis of large intestine without perforation or abscess without bleeding: Secondary | ICD-10-CM | POA: Diagnosis not present

## 2023-07-22 DIAGNOSIS — K2100 Gastro-esophageal reflux disease with esophagitis, without bleeding: Secondary | ICD-10-CM

## 2023-07-22 DIAGNOSIS — E782 Mixed hyperlipidemia: Secondary | ICD-10-CM

## 2023-07-22 DIAGNOSIS — Z79899 Other long term (current) drug therapy: Secondary | ICD-10-CM | POA: Diagnosis not present

## 2023-07-22 DIAGNOSIS — Z23 Encounter for immunization: Secondary | ICD-10-CM | POA: Diagnosis not present

## 2023-07-22 DIAGNOSIS — R6510 Systemic inflammatory response syndrome (SIRS) of non-infectious origin without acute organ dysfunction: Secondary | ICD-10-CM | POA: Diagnosis not present

## 2023-07-22 DIAGNOSIS — K219 Gastro-esophageal reflux disease without esophagitis: Secondary | ICD-10-CM | POA: Diagnosis present

## 2023-07-22 DIAGNOSIS — A6002 Herpesviral infection of other male genital organs: Secondary | ICD-10-CM | POA: Diagnosis present

## 2023-07-22 DIAGNOSIS — K5792 Diverticulitis of intestine, part unspecified, without perforation or abscess without bleeding: Secondary | ICD-10-CM | POA: Diagnosis not present

## 2023-07-22 DIAGNOSIS — E871 Hypo-osmolality and hyponatremia: Secondary | ICD-10-CM | POA: Diagnosis present

## 2023-07-22 DIAGNOSIS — K5730 Diverticulosis of large intestine without perforation or abscess without bleeding: Secondary | ICD-10-CM | POA: Diagnosis not present

## 2023-07-22 LAB — CBC WITH DIFFERENTIAL/PLATELET
Abs Immature Granulocytes: 0.07 10*3/uL (ref 0.00–0.07)
Abs Immature Granulocytes: 0.08 10*3/uL — ABNORMAL HIGH (ref 0.00–0.07)
Basophils Absolute: 0 10*3/uL (ref 0.0–0.1)
Basophils Absolute: 0 10*3/uL (ref 0.0–0.1)
Basophils Relative: 0 %
Basophils Relative: 0 %
Eosinophils Absolute: 0.1 10*3/uL (ref 0.0–0.5)
Eosinophils Absolute: 0 10*3/uL (ref 0.0–0.5)
Eosinophils Relative: 0 %
Eosinophils Relative: 0 %
HCT: 42.6 % (ref 39.0–52.0)
HCT: 39.9 % (ref 39.0–52.0)
Hemoglobin: 14.1 g/dL (ref 13.0–17.0)
Hemoglobin: 13.3 g/dL (ref 13.0–17.0)
Immature Granulocytes: 1 %
Immature Granulocytes: 1 %
Lymphocytes Relative: 13 %
Lymphocytes Relative: 12 %
Lymphs Abs: 1.9 10*3/uL (ref 0.7–4.0)
Lymphs Abs: 2 10*3/uL (ref 0.7–4.0)
MCH: 27.3 pg (ref 26.0–34.0)
MCH: 27.4 pg (ref 26.0–34.0)
MCHC: 33.1 g/dL (ref 30.0–36.0)
MCHC: 33.3 g/dL (ref 30.0–36.0)
MCV: 82.4 fL (ref 80.0–100.0)
MCV: 82.3 fL (ref 80.0–100.0)
Monocytes Absolute: 1 10*3/uL (ref 0.1–1.0)
Monocytes Absolute: 1.1 10*3/uL — ABNORMAL HIGH (ref 0.1–1.0)
Monocytes Relative: 7 %
Monocytes Relative: 7 %
Neutro Abs: 12 10*3/uL — ABNORMAL HIGH (ref 1.7–7.7)
Neutro Abs: 13.5 10*3/uL — ABNORMAL HIGH (ref 1.7–7.7)
Neutrophils Relative %: 79 %
Neutrophils Relative %: 80 %
Platelets: 308 10*3/uL (ref 150–400)
Platelets: 278 10*3/uL (ref 150–400)
RBC: 5.17 MIL/uL (ref 4.22–5.81)
RBC: 4.85 MIL/uL (ref 4.22–5.81)
RDW: 13.2 % (ref 11.5–15.5)
RDW: 13.2 % (ref 11.5–15.5)
WBC: 15.1 10*3/uL — ABNORMAL HIGH (ref 4.0–10.5)
WBC: 16.6 10*3/uL — ABNORMAL HIGH (ref 4.0–10.5)
nRBC: 0 % (ref 0.0–0.2)
nRBC: 0 % (ref 0.0–0.2)

## 2023-07-22 LAB — URINALYSIS, ROUTINE W REFLEX MICROSCOPIC
Bacteria, UA: NONE SEEN
Bilirubin Urine: NEGATIVE
Glucose, UA: NEGATIVE mg/dL
Ketones, ur: NEGATIVE mg/dL
Leukocytes,Ua: NEGATIVE
Nitrite: NEGATIVE
Protein, ur: NEGATIVE mg/dL
Specific Gravity, Urine: 1.013 (ref 1.005–1.030)
pH: 5 (ref 5.0–8.0)

## 2023-07-22 LAB — COMPREHENSIVE METABOLIC PANEL
ALT: 26 U/L (ref 0–44)
AST: 18 U/L (ref 15–41)
Albumin: 3.6 g/dL (ref 3.5–5.0)
Alkaline Phosphatase: 65 U/L (ref 38–126)
Anion gap: 9 (ref 5–15)
BUN: 12 mg/dL (ref 8–23)
CO2: 23 mmol/L (ref 22–32)
Calcium: 8.3 mg/dL — ABNORMAL LOW (ref 8.9–10.3)
Chloride: 99 mmol/L (ref 98–111)
Creatinine, Ser: 0.99 mg/dL (ref 0.61–1.24)
GFR, Estimated: >60 mL/min (ref >60)
Glucose, Bld: 182 mg/dL — ABNORMAL HIGH (ref 70–99)
Potassium: 3.4 mmol/L — ABNORMAL LOW (ref 3.5–5.1)
Sodium: 131 mmol/L — ABNORMAL LOW (ref 135–145)
Total Bilirubin: 0.7 mg/dL (ref 0.3–1.2)
Total Protein: 6.4 g/dL — ABNORMAL LOW (ref 6.5–8.1)

## 2023-07-22 LAB — BASIC METABOLIC PANEL
Anion gap: 11 (ref 5–15)
BUN: 15 mg/dL (ref 8–23)
CO2: 24 mmol/L (ref 22–32)
Calcium: 8.5 mg/dL — ABNORMAL LOW (ref 8.9–10.3)
Chloride: 98 mmol/L (ref 98–111)
Creatinine, Ser: 1.05 mg/dL (ref 0.61–1.24)
GFR, Estimated: >60 mL/min (ref >60)
Glucose, Bld: 151 mg/dL — ABNORMAL HIGH (ref 70–99)
Potassium: 3.4 mmol/L — ABNORMAL LOW (ref 3.5–5.1)
Sodium: 133 mmol/L — ABNORMAL LOW (ref 135–145)

## 2023-07-22 LAB — MAGNESIUM: Magnesium: 1.6 mg/dL — ABNORMAL LOW (ref 1.7–2.4)

## 2023-07-22 MED ORDER — METRONIDAZOLE 500 MG/100ML IV SOLN
500.0000 mg | Freq: Two times a day (BID) | INTRAVENOUS | Status: DC
  Administered 2023-07-22 – 2023-07-24 (×4): 500 mg via INTRAVENOUS
  Filled 2023-07-22 (×4): qty 100

## 2023-07-22 MED ORDER — MAGNESIUM SULFATE 4 GM/100ML IV SOLN
4.0000 g | Freq: Once | INTRAVENOUS | Status: AC
  Administered 2023-07-22: 4 g via INTRAVENOUS
  Filled 2023-07-22: qty 100

## 2023-07-22 MED ORDER — ELVITEG-COBIC-EMTRICIT-TENOFAF 150-150-200-10 MG PO TABS: 1.0000 | ORAL_TABLET | Freq: Every day | ORAL | Status: DC

## 2023-07-22 MED ORDER — CIPROFLOXACIN IN D5W 400 MG/200ML IV SOLN
400.0000 mg | Freq: Two times a day (BID) | INTRAVENOUS | Status: DC
  Administered 2023-07-22 – 2023-07-24 (×4): 400 mg via INTRAVENOUS
  Filled 2023-07-22 (×4): qty 200

## 2023-07-22 MED ORDER — IOHEXOL 300 MG/ML  SOLN
100.0000 mL | Freq: Once | INTRAMUSCULAR | Status: AC | PRN
  Administered 2023-07-22: 100 mL via INTRAVENOUS

## 2023-07-22 MED ORDER — ACETAMINOPHEN 325 MG PO TABS
650.0000 mg | ORAL_TABLET | Freq: Once | ORAL | Status: AC
  Administered 2023-07-22: 650 mg via ORAL
  Filled 2023-07-22: qty 2

## 2023-07-22 MED ORDER — ONDANSETRON HCL 4 MG/2ML IJ SOLN: 4.0000 mg | Freq: Four times a day (QID) | INTRAMUSCULAR | Status: DC | PRN

## 2023-07-22 MED ORDER — ELVITEG-COBIC-EMTRICIT-TENOFAF 150-150-200-10 MG PO TABS
1.0000 | ORAL_TABLET | Freq: Every day | ORAL | Status: DC
  Administered 2023-07-22 – 2023-07-23 (×2): 1 via ORAL
  Filled 2023-07-22 (×4): qty 1

## 2023-07-22 MED ORDER — CIPROFLOXACIN IN D5W 400 MG/200ML IV SOLN
400.0000 mg | Freq: Once | INTRAVENOUS | Status: AC
  Administered 2023-07-22: 400 mg via INTRAVENOUS
  Filled 2023-07-22: qty 200

## 2023-07-22 MED ORDER — EZETIMIBE 10 MG PO TABS
10.0000 mg | ORAL_TABLET | Freq: Every day | ORAL | Status: DC
  Administered 2023-07-23 – 2023-07-24 (×2): 10 mg via ORAL
  Filled 2023-07-22 (×3): qty 1

## 2023-07-22 MED ORDER — SODIUM CHLORIDE 0.9 % IV SOLN
INTRAVENOUS | Status: DC
  Administered 2023-07-23: 1000 mL via INTRAVENOUS

## 2023-07-22 MED ORDER — ELVITEG-COBIC-EMTRICIT-TENOFAF 150-150-200-10 MG PO TABS
1.0000 | ORAL_TABLET | Freq: Every day | ORAL | Status: DC
  Filled 2023-07-22 (×2): qty 1

## 2023-07-22 MED ORDER — ACETAMINOPHEN 650 MG RE SUPP: 650.0000 mg | Freq: Four times a day (QID) | RECTAL | Status: DC | PRN

## 2023-07-22 MED ORDER — POTASSIUM CHLORIDE 10 MEQ/100ML IV SOLN
INTRAVENOUS | Status: AC
  Administered 2023-07-22: 10 meq via INTRAVENOUS
  Filled 2023-07-22: qty 100

## 2023-07-22 MED ORDER — MORPHINE SULFATE (PF) 2 MG/ML IV SOLN: 2.0000 mg | INTRAVENOUS | Status: DC | PRN

## 2023-07-22 MED ORDER — POTASSIUM CHLORIDE 10 MEQ/100ML IV SOLN
10.0000 meq | INTRAVENOUS | Status: AC
  Administered 2023-07-22 (×3): 10 meq via INTRAVENOUS
  Filled 2023-07-22 (×3): qty 100

## 2023-07-22 MED ORDER — OXYCODONE HCL 5 MG PO TABS: 5.0000 mg | ORAL_TABLET | ORAL | Status: DC | PRN

## 2023-07-22 MED ORDER — METRONIDAZOLE 500 MG/100ML IV SOLN
500.0000 mg | Freq: Once | INTRAVENOUS | Status: AC
  Administered 2023-07-22: 500 mg via INTRAVENOUS
  Filled 2023-07-22: qty 100

## 2023-07-22 MED ORDER — ACETAMINOPHEN 325 MG PO TABS: 650.0000 mg | ORAL_TABLET | Freq: Four times a day (QID) | ORAL | Status: DC | PRN

## 2023-07-22 MED ORDER — ONDANSETRON HCL 4 MG PO TABS: 4.0000 mg | ORAL_TABLET | Freq: Four times a day (QID) | ORAL | Status: DC | PRN

## 2023-07-22 MED ORDER — PANTOPRAZOLE SODIUM 40 MG IV SOLR
40.0000 mg | Freq: Two times a day (BID) | INTRAVENOUS | Status: DC
  Administered 2023-07-22 – 2023-07-24 (×5): 40 mg via INTRAVENOUS
  Filled 2023-07-22 (×5): qty 10

## 2023-07-22 NOTE — Assessment & Plan Note (Signed)
-   Was a fever at 100.8, leukocytosis at 15.1 - No endorgan damage - Secondary to diverticulitis - Continue Cipro/Flagyl - Continue to monitor

## 2023-07-22 NOTE — ED Notes (Signed)
Provider at bedside

## 2023-07-22 NOTE — Assessment & Plan Note (Signed)
-  Continue [name]

## 2023-07-22 NOTE — Progress Notes (Addendum)
IR Procedure Request - Pelvic Abscess Drain Placement   67 y.o male inpatient [facility]). Spanish speaking male. History of HIV, GERD, HSV, diverticulosis. Presented to the ED at [facility] with bilateral groin pain X 2 days. Found to have a pelvic abscess. CT Abd pelvis from 9.16.24 reads Changes consistent with acute sigmoid diverticulitis with a small extraluminal fluid collection which may represent a developing abscess. It measures 1 cm in greatest dimension lying between the sigmoid and the bladder as described. WBC is 16.6, sodium 131. Afebrile. HR 104 last B/P 142/85   Case reviewed  with IR Attending Dr. Malachy Moan. After looking at the pertinent imaging The area in question appears to be a giant diverticulum or  a pocket of air. Not an abscess. This was communicated to the Team via EPIC Chat. No IR intervention needed at this time. Should any other IR procedures be warranted please re-consult IR.

## 2023-07-22 NOTE — ED Provider Notes (Signed)
[place] EMERGENCY DEPARTMENT AT [facility]  Provider Note  CSN: [id number] Arrival date & time: 07/21/23 2250  History Chief Complaint  Patient presents with   Groin Pain   History via Spanish Interpreter Luis Gomez is a 67 y.o. male with history of well controlled HIV, here with daughter for evaluation of bilateral groin pain since yesterday. Comes and goes. Feels like a spasm, sometimes on the surface and sometimes deep in his pelvis. He has not had any dysuria, but does report some chills while in the waiting room. No testicular pain. No penile discharge. No leg swelling Pain is mildly increased with walking/moving legs.    Home Medications Prior to Admission medications   Medication Sig Start Date End Date Taking? Authorizing Provider  ezetimibe (ZETIA) 10 MG tablet Take 10 mg by mouth daily. 12/27/22   [provider]  [name] 150-150-200-10 MG TABS tablet TAKE 1 TABLET BY MOUTH DAILY 01/01/23 01/01/24  Gardiner Barefoot, MD  [name] 150-150-200-10 MG TABS tablet TAKE 1 TABLET BY MOUTH DAILY 01/10/23 01/10/24  Comer, Belia Heman, MD  Omega-3 Fatty Acids (FISH OIL) 1000 MG CAPS Take by mouth. Patient not taking: Reported on 01/01/2023    [provider]  pantoprazole (PROTONIX) 40 MG tablet Take 1 tablet (40 mg total) by mouth daily before breakfast. 03/26/23   Gelene Mink, NP  polycarbophil (FIBERCON) 625 MG tablet Take 625 mg by mouth daily. Patient not taking: Reported on 01/01/2023    [provider]  valACYclovir (VALTREX) 500 MG tablet Take 500 mg by mouth 2 (two) times daily. Patient not taking: Reported on 01/01/2023    [provider]  Vitamin D, Ergocalciferol, (DRISDOL) 1.25 MG (50000 UNIT) CAPS capsule Take 50,000 Units by mouth once a week. 12/27/22   [provider]     Allergies    Patient has no known allergies.   Review of Systems   Review of Systems Please see HPI for pertinent positives and  negatives  Physical Exam BP 134/72   Pulse 99   Temp (!) 100.8 F (38.2 C) (Oral)   Resp 18   Ht 5\' 6"  (1.676 m)   Wt 106.6 kg   SpO2 99%   BMI 37.93 kg/m   Physical Exam Vitals and nursing note reviewed.  Constitutional:      Appearance: Normal appearance.  HENT:     Head: Normocephalic and atraumatic.     Nose: Nose normal.     Mouth/Throat:     Mouth: Mucous membranes are moist.  Eyes:     Extraocular Movements: Extraocular movements intact.     Conjunctiva/sclera: Conjunctivae normal.  Cardiovascular:     Rate and Rhythm: Normal rate.  Pulmonary:     Effort: Pulmonary effort is normal.     Breath sounds: Normal breath sounds.  Abdominal:     General: Abdomen is flat.     Palpations: Abdomen is soft.     Tenderness: There is no abdominal tenderness.  Genitourinary:    Penis: Normal.      Testes: Normal.     Comments: No inguinal hernia. No tenderness to palpation Musculoskeletal:        General: No swelling. Normal range of motion.     Cervical back: Neck supple.  Skin:    General: Skin is warm and dry.  Neurological:     General: No focal deficit present.     Mental Status: He is alert.  Psychiatric:  Mood and Affect: Mood normal.     ED Results / Procedures / Treatments   EKG None  Procedures Procedures  Medications Ordered in the ED Medications  ciprofloxacin (CIPRO) IVPB 400 mg (has no administration in time range)  metroNIDAZOLE (FLAGYL) IVPB 500 mg (has no administration in time range)  acetaminophen (TYLENOL) tablet 650 mg (650 mg Oral Given 07/22/23 0200)  iohexol (OMNIPAQUE) 300 MG/ML solution 100 mL (100 mLs Intravenous Contrast Given 07/22/23 0217)    Initial Impression and Plan  Patient here with nonspecific groin pains. Some symptoms concerning for prostatitis. UA done in triage negative for infection. He had an episode of chills, but does not feel warm. Will ask RN to recheck temp. Check labs and send for CT to eval infectious  etiology for his pain.   ED Course   Clinical Course as of 07/22/23 0351  Mon Jul 22, 2023  0203 Recheck of temp shows fever. CBC with leukocytosis.  [CS]  0222 BMP is unremarkable.  [CS]  [id number] I personally viewed the images from radiology studies and agree with radiologist interpretation: CT shows diverticulitis with early abscess formation. Discussed these results with the patient and daughter. Recommend admission for IV Abx. Hospitalist paged.  [CS]  [phone] Spoke with Dr. Carren Rang, Hospitalist, who will evaluate for admission.  [CS]    Clinical Course User Index [CS] Pollyann Savoy, MD     MDM Rules/Calculators/A&P Medical Decision Making Problems Addressed: Diverticulitis of large intestine with abscess without bleeding: acute illness or injury  Amount and/or Complexity of Data Reviewed Labs: ordered. Decision-making details documented in ED Course. Radiology: ordered and independent interpretation performed. Decision-making details documented in ED Course.  Risk OTC drugs. Prescription drug management. Decision regarding hospitalization.     Final Clinical Impression(s) / ED Diagnoses Final diagnoses:  Diverticulitis of large intestine with abscess without bleeding    Rx / DC Orders ED Discharge Orders     None        Pollyann Savoy, MD 07/22/23 [phone]

## 2023-07-22 NOTE — Assessment & Plan Note (Signed)
-   CT abdomen pelvis shows sigmoid diverticulitis with a small fluid collection that could be developing abscess - Consult IR for possible drainage of the abscess - Consult general surgery - Continue Cipro Flagyl - Continue pain control with pain scale - Trend leukocytosis which is currently at 15.1 - Continue to monitor

## 2023-07-22 NOTE — H&P (Signed)
History and Physical    Patient: Luis Gomez GNF:[id number] DOB: 01-20-1956 DOA: 07/21/2023 DOS: the patient was seen and examined on 07/22/2023 PCP: Assunta Found, MD  Patient coming from: Home  Chief Complaint:  Chief Complaint  Patient presents with   Groin Pain   HPI: Elye Olaughlin is a 67 y.o. male with medical history significant of HIV, genital HSV, GERD, diverticulosis, and more presents the ED with a chief complaint of abdominal pain.  Patient reports the abdominal pain that started the day prior to presentation.  It is intermittent.  He had no fever.  The pain was unpredictable.  He is not sure if eating would bring it on because he has not ate.  He then says his last meal was Sunday afternoon.  So it is unclear when he last ate.  Last normal bowel movement was Sunday at 7 PM.  He had no nausea or vomiting.  He has had similar abdominal pain several years ago.  He does not remember what the doctor said about it, but he thinks he may have done a surgery at that time.  Patient has no other complaints at this time.  This patient is Spanish-speaking and the history was collected with the use of a tele interpreter Review of Systems: As mentioned in the history of present illness. All other systems reviewed and are negative. Past Medical History:  Diagnosis Date   Diverticulosis    Genital HSV    GERD (gastroesophageal reflux disease)    Last EGD->07/02/06 normal   HIV disease (HCC)    S/P colonoscopy 07/02/2006   Left sided diverticula   Past Surgical History:  Procedure Laterality Date   BACK SURGERY     COLONOSCOPY  07/03/2006   Normal rectum, left-sided diverticulum   COLONOSCOPY N/A 02/07/2018   Rourk: Sessile serrated polyp removed from the colon, tubular adenoma.  Next colonoscopy in 5 years.   COLONOSCOPY N/A 09/14/2020   Diverticulosis, 2 polyps removed (sessile serrated polyp, tubular adenoma), next colonoscopy in 5 years.   ESOPHAGOGASTRODUODENOSCOPY   07/03/2006   Normal esophagus, stomach, D1, D2.   ESOPHAGOGASTRODUODENOSCOPY N/A 04/01/2020   Rourk: Severe erosive/ulcerative reflux esophagitis extending up 8 cm from the GE junction.  Noncritical Schatzki ring not manipulated.  Medium sized hiatal hernia.   POLYPECTOMY  02/07/2018   Procedure: POLYPECTOMY;  Surgeon: Corbin Ade, MD;  Location: [facility];  Service: Endoscopy;;  colon    POLYPECTOMY  09/14/2020   Procedure: POLYPECTOMY;  Surgeon: Corbin Ade, MD;  Location: [facility];  Service: Endoscopy;;   Social History:  reports that he has never smoked. He has never used smokeless tobacco. He reports that he does not drink alcohol and does not use drugs.  No Known Allergies  Family History  Problem Relation Age of Onset   Colon cancer Neg Hx        parents passed over 40 years ago, UNKNOWN if colon cancer    Prior to Admission medications   Medication Sig Start Date End Date Taking? Authorizing Provider  ezetimibe (ZETIA) 10 MG tablet Take 10 mg by mouth daily. 12/27/22   [provider]  [name] 150-150-200-10 MG TABS tablet TAKE 1 TABLET BY MOUTH DAILY 01/01/23 01/01/24  Gardiner Barefoot, MD  [name] 150-150-200-10 MG TABS tablet TAKE 1 TABLET BY MOUTH DAILY 01/10/23 01/10/24  Comer, Belia Heman, MD  Omega-3 Fatty Acids (FISH OIL) 1000 MG CAPS Take by mouth. Patient not taking: Reported on 01/01/2023  [provider]  pantoprazole (PROTONIX) 40 MG tablet Take 1 tablet (40 mg total) by mouth daily before breakfast. 03/26/23   Gelene Mink, NP  polycarbophil (FIBERCON) 625 MG tablet Take 625 mg by mouth daily. Patient not taking: Reported on 01/01/2023    [provider]  valACYclovir (VALTREX) 500 MG tablet Take 500 mg by mouth 2 (two) times daily. Patient not taking: Reported on 01/01/2023    [provider]  Vitamin D, Ergocalciferol, (DRISDOL) 1.25 MG (50000 UNIT) CAPS capsule Take 50,000 Units by mouth once a week. 12/27/22   [provider]    Physical Exam: Vitals:   07/22/23 0515 07/22/23 0530 07/22/23 0600 07/22/23 0615  BP: 126/81  (!) 142/85   Pulse: 97 95 (!) 107 (!) 104  Resp: (!) 26 (!) 25 (!) 29 18  Temp:      TempSrc:      SpO2: 97% 97% 96% 97%  Weight:      Height:       1.  General: Patient lying supine in bed,  no acute distress   2. Psychiatric: Alert and oriented x 3, mood and behavior normal for situation, pleasant and cooperative with exam   3. Neurologic: Speech and language are normal, face is symmetric, moves all 4 extremities voluntarily, at baseline without acute deficits on limited exam   4. HEENMT:  Head is atraumatic, normocephalic, pupils reactive to light, neck is supple, trachea is midline, mucous membranes are moist   5. Respiratory : Lungs are clear to auscultation bilaterally without wheezing, rhonchi, rales, no cyanosis, no increase in work of breathing or accessory muscle use   6. Cardiovascular : Heart rate slightly tachycardic, rhythm is regular, no murmurs, rubs or gallops, no peripheral edema, peripheral pulses palpated   7. Gastrointestinal:  Abdomen is soft, nondistended, nontender to palpation bowel sounds active, no masses or organomegaly palpated   8. Skin:  Skin is warm, dry and intact without rashes, acute lesions, or ulcers on limited exam   9.Musculoskeletal:  No acute deformities or trauma, no asymmetry in tone, no peripheral edema, peripheral pulses palpated, no tenderness to palpation in the extremities  Data Reviewed: In the ED Temp up to 100.8, heart rate 99, respiratory rate 18, blood pressure 131/72-139/91, satting 99% Leukocytosis of 15.1, hemoglobin 14.1, platelets 308 Chemistry reveals a hypokalemia at 3.4, slight hyponatremia at 133 UA is not negative UTI CT abdomen pelvis shows sigmoid diverticulitis with a possible small abscess Patient was started on Cipro, Flagyl, Tylenol Admission was requested for acute  diverticulitis  Assessment and Plan: * Sigmoid diverticulitis - CT abdomen pelvis shows sigmoid diverticulitis with a small fluid collection that could be developing abscess - Consult IR for possible drainage of the abscess - Consult general surgery - Continue Cipro Flagyl - Continue pain control with pain scale - Trend leukocytosis which is currently at 15.1 - Continue to monitor  Hypokalemia - Potassium 3.4 - Replaced with 40 mEq of potassium - Trend in the a.m.  SIRS (systemic inflammatory response syndrome) (HCC) - Was a fever at 100.8, leukocytosis at 15.1 - No endorgan damage - Secondary to diverticulitis - Continue Cipro/Flagyl - Continue to monitor  Hyperlipidemia - Continue Zetia  GERD - Continue Protonix  Human immunodeficiency virus (HIV) disease (HCC) - Continue [name]      Advance Care Planning:   Code Status: Full Code  Consults: General Surgery and IR  Family Communication: No family at bedside  Severity of Illness: The  appropriate patient status for this patient is INPATIENT. Inpatient status is judged to be reasonable and necessary in order to provide the required intensity of service to ensure the patient's safety. The patient's presenting symptoms, physical exam findings, and initial radiographic and laboratory data in the context of their chronic comorbidities is felt to place them at high risk for further clinical deterioration. Furthermore, it is not anticipated that the patient will be medically stable for discharge from the hospital within 2 midnights of admission.   * I certify that at the point of admission it is my clinical judgment that the patient will require inpatient hospital care spanning beyond 2 midnights from the point of admission due to high intensity of service, high risk for further deterioration and high frequency of surveillance required.*  Author: Lilyan Gilford, DO 07/22/2023 6:54 AM  For on call review  [email or web address].

## 2023-07-22 NOTE — Assessment & Plan Note (Signed)
Continue Zetia.

## 2023-07-22 NOTE — Plan of Care (Signed)
  Problem: Education: Goal: Knowledge of General Education information will improve Description Including pain rating scale, medication(s)/side effects and non-pharmacologic comfort measures Outcome: Progressing   Problem: Health Behavior/Discharge Planning: Goal: Ability to manage health-related needs will improve Outcome: Progressing   

## 2023-07-22 NOTE — Assessment & Plan Note (Signed)
-   Continue Protonix

## 2023-07-22 NOTE — Progress Notes (Signed)
ASSUMPTION OF CARE NOTE   07/22/2023 1:43 PM  Luis Gomez was seen and examined.  The H&P by the admitting provider, orders, imaging was reviewed.  Please see new orders.  Will continue to follow.   Vitals:   07/22/23 0814 07/22/23 1100  BP:  131/76  Pulse:  88  Resp:  (!) 22  Temp: 98.9 F (37.2 C) 99.7 F (37.6 C)  SpO2:  97%    Results for orders placed or performed during the hospital encounter of 07/21/23  Urinalysis, Routine w reflex microscopic -Urine, Clean Catch  Result Value Ref Range   Color, Urine YELLOW YELLOW   APPearance CLEAR CLEAR   Specific Gravity, Urine 1.013 1.005 - 1.030   pH 5.0 5.0 - 8.0   Glucose, UA NEGATIVE NEGATIVE mg/dL   Hgb urine dipstick MODERATE (A) NEGATIVE   Bilirubin Urine NEGATIVE NEGATIVE   Ketones, ur NEGATIVE NEGATIVE mg/dL   Protein, ur NEGATIVE NEGATIVE mg/dL   Nitrite NEGATIVE NEGATIVE   Leukocytes,Ua NEGATIVE NEGATIVE   RBC / HPF 0-5 0 - 5 RBC/hpf   WBC, UA 0-5 0 - 5 WBC/hpf   Bacteria, UA NONE SEEN NONE SEEN   Squamous Epithelial / HPF 0-5 0 - 5 /HPF   Mucus PRESENT   Basic metabolic panel  Result Value Ref Range   Sodium 133 (L) 135 - 145 mmol/L   Potassium 3.4 (L) 3.5 - 5.1 mmol/L   Chloride 98 98 - 111 mmol/L   CO2 24 22 - 32 mmol/L   Glucose, Bld 151 (H) 70 - 99 mg/dL   BUN 15 8 - 23 mg/dL   Creatinine, Ser 1.61 0.61 - 1.24 mg/dL   Calcium 8.5 (L) 8.9 - 10.3 mg/dL   GFR, Estimated >09 >60 mL/min   Anion gap 11 5 - 15  CBC with Differential  Result Value Ref Range   WBC 15.1 (H) 4.0 - 10.5 K/uL   RBC 5.17 4.22 - 5.81 MIL/uL   Hemoglobin 14.1 13.0 - 17.0 g/dL   HCT 45.4 09.8 - 11.9 %   MCV 82.4 80.0 - 100.0 fL   MCH 27.3 26.0 - 34.0 pg   MCHC 33.1 30.0 - 36.0 g/dL   RDW 14.7 82.9 - 56.2 %   Platelets 308 150 - 400 K/uL   nRBC 0.0 0.0 - 0.2 %   Neutrophils Relative % 79 %   Neutro Abs 12.0 (H) 1.7 - 7.7 K/uL   Lymphocytes Relative 13 %   Lymphs Abs 1.9 0.7 - 4.0 K/uL   Monocytes Relative 7 %    Monocytes Absolute 1.0 0.1 - 1.0 K/uL   Eosinophils Relative 0 %   Eosinophils Absolute 0.1 0.0 - 0.5 K/uL   Basophils Relative 0 %   Basophils Absolute 0.0 0.0 - 0.1 K/uL   Immature Granulocytes 1 %   Abs Immature Granulocytes 0.07 0.00 - 0.07 K/uL  Comprehensive metabolic panel  Result Value Ref Range   Sodium 131 (L) 135 - 145 mmol/L   Potassium 3.4 (L) 3.5 - 5.1 mmol/L   Chloride 99 98 - 111 mmol/L   CO2 23 22 - 32 mmol/L   Glucose, Bld 182 (H) 70 - 99 mg/dL   BUN 12 8 - 23 mg/dL   Creatinine, Ser 1.30 0.61 - 1.24 mg/dL   Calcium 8.3 (L) 8.9 - 10.3 mg/dL   Total Protein 6.4 (L) 6.5 - 8.1 g/dL   Albumin 3.6 3.5 - 5.0 g/dL   AST 18 15 - 41  U/L   ALT 26 0 - 44 U/L   Alkaline Phosphatase 65 38 - 126 U/L   Total Bilirubin 0.7 0.3 - 1.2 mg/dL   GFR, Estimated >40 >98 mL/min   Anion gap 9 5 - 15  Magnesium  Result Value Ref Range   Magnesium 1.6 (L) 1.7 - 2.4 mg/dL  CBC with Differential/Platelet  Result Value Ref Range   WBC 16.6 (H) 4.0 - 10.5 K/uL   RBC 4.85 4.22 - 5.81 MIL/uL   Hemoglobin 13.3 13.0 - 17.0 g/dL   HCT 11.9 14.7 - 82.9 %   MCV 82.3 80.0 - 100.0 fL   MCH 27.4 26.0 - 34.0 pg   MCHC 33.3 30.0 - 36.0 g/dL   RDW 56.2 13.0 - 86.5 %   Platelets 278 150 - 400 K/uL   nRBC 0.0 0.0 - 0.2 %   Neutrophils Relative % 80 %   Neutro Abs 13.5 (H) 1.7 - 7.7 K/uL   Lymphocytes Relative 12 %   Lymphs Abs 2.0 0.7 - 4.0 K/uL   Monocytes Relative 7 %   Monocytes Absolute 1.1 (H) 0.1 - 1.0 K/uL   Eosinophils Relative 0 %   Eosinophils Absolute 0.0 0.0 - 0.5 K/uL   Basophils Relative 0 %   Basophils Absolute 0.0 0.0 - 0.1 K/uL   Immature Granulocytes 1 %   Abs Immature Granulocytes 0.08 (H) 0.00 - 0.07 K/uL   C. Laural Benes, MD [facility]   07/21/2023 11:12 PM How to contact the [facility] Attending or Consulting provider 7A - 7P or covering provider during after hours 7P -7A, for this patient?  Check the care team in [facility] and look for a) attending/consulting [facility] provider  listed and b) the [facility] team listed Log into [email or web address] and use [place]'s universal password to access. If you do not have the password, please contact the hospital operator. Locate the [facility] provider you are looking for under [facility] and page to a number that you can be directly reached. If you still have difficulty reaching the provider, please page the [facility] (Director on Call) for the Hospitalists listed on amion for assistance.

## 2023-07-22 NOTE — Assessment & Plan Note (Signed)
-   Potassium 3.4 - Replaced with 40 mEq of potassium - Trend in the a.m.

## 2023-07-22 NOTE — Hospital Course (Signed)
66 y.o. male with medical history significant of HIV, genital HSV, GERD, diverticulosis, and more presents the ED with a chief complaint of abdominal pain.  Patient reports the abdominal pain that started the day prior to presentation.  It is intermittent.  He had no fever.  The pain was unpredictable.  He is not sure if eating would bring it on because he has not ate.  He then says his last meal was Sunday afternoon.  So it is unclear when he last ate.  Last normal bowel movement was Sunday at 7 PM.  He had no nausea or vomiting.  He has had similar abdominal pain several years ago.  He does not remember what the doctor said about it, but he thinks he may have done a surgery at that time.  Patient has no other complaints at this time.   This patient is Spanish-speaking and the history was collected with the use of a qualified medical Spanish tele-interpreter

## 2023-07-22 NOTE — TOC CM/SW Note (Signed)
Transition of Care [facility]) - Inpatient Brief Assessment   Patient Details  Name: Luis Gomez MRN: [id number] Date of Birth: 13-Jan-1956  Transition of Care [facility]) CM/SW Contact:    Villa Herb, LCSWA Phone Number: 07/22/2023, 10:21 AM   Clinical Narrative: Transition of Care Department [facility]) has reviewed patient and no TOC needs have been identified at this time. We will continue to monitor patient advancement through interdisciplinary progression rounds. If new patient transition needs arise, please place a TOC consult.  Transition of Care Asessment: Insurance and Status: Insurance coverage has been reviewed Patient has primary care physician: Yes Home environment has been reviewed: from home Prior level of function:: independent Prior/Current Home Services: No current home services Social Determinants of Health Reivew: SDOH reviewed no interventions necessary Readmission risk has been reviewed: Yes Transition of care needs: no transition of care needs at this time

## 2023-07-22 NOTE — ED Notes (Signed)
ED Provider at bedside. 

## 2023-07-22 NOTE — Consult Note (Signed)
[facility] Consult  Reason for Consult: Sigmoid diverticulitis? Small perforation 1cm  Referring Physician: Dr. Laural Benes   Chief Complaint   Groin Pain     HPI: Luis Gomez is a 67 y.o. male with HIV, GERD who came in with left lower abdominal pain and CT concerning for diverticulitis with possible 1cm abscess. IR was consulted by the hospitalist and they felt like this was a diverticula and not abscess.  He is doing well today and having less pain. I spoke with him with interpretor.   He says he has only really had pain like this with lifting. He thinks he may have had diverticulitis before.   Past Medical History:  Diagnosis Date   Diverticulosis    Genital HSV    GERD (gastroesophageal reflux disease)    Last EGD->07/02/06 normal   HIV disease (HCC)    S/P colonoscopy 07/02/2006   Left sided diverticula    Past Surgical History:  Procedure Laterality Date   BACK SURGERY     COLONOSCOPY  07/03/2006   Normal rectum, left-sided diverticulum   COLONOSCOPY N/A 02/07/2018   Rourk: Sessile serrated polyp removed from the colon, tubular adenoma.  Next colonoscopy in 5 years.   COLONOSCOPY N/A 09/14/2020   Diverticulosis, 2 polyps removed (sessile serrated polyp, tubular adenoma), next colonoscopy in 5 years.   ESOPHAGOGASTRODUODENOSCOPY  07/03/2006   Normal esophagus, stomach, D1, D2.   ESOPHAGOGASTRODUODENOSCOPY N/A 04/01/2020   Rourk: Severe erosive/ulcerative reflux esophagitis extending up 8 cm from the GE junction.  Noncritical Schatzki ring not manipulated.  Medium sized hiatal hernia.   POLYPECTOMY  02/07/2018   Procedure: POLYPECTOMY;  Surgeon: Corbin Ade, MD;  [facility]: [facility];  Service: Endoscopy;;  colon    POLYPECTOMY  09/14/2020   Procedure: POLYPECTOMY;  Surgeon: Corbin Ade, MD;  Location: [facility];  Service: Endoscopy;;    Family History  Problem Relation Age of Onset   Colon cancer Neg Hx        parents passed over  40 years ago, UNKNOWN if colon cancer    Social History   Tobacco Use   Smoking status: Never   Smokeless tobacco: Never  Vaping Use   Vaping status: Never Used  Substance Use Topics   Alcohol use: No   Drug use: No    Medications: I have reviewed the patient's current medications. Prior to Admission:  Medications Prior to Admission  Medication Sig Dispense Refill Last Dose   ezetimibe (ZETIA) 10 MG tablet Take 10 mg by mouth daily.      [name] 150-150-200-10 MG TABS tablet TAKE 1 TABLET BY MOUTH DAILY 30 tablet 11    [name] 150-150-200-10 MG TABS tablet TAKE 1 TABLET BY MOUTH DAILY 30 tablet 3    Omega-3 Fatty Acids (FISH OIL) 1000 MG CAPS Take by mouth. (Patient not taking: Reported on 01/01/2023)      pantoprazole (PROTONIX) 40 MG tablet Take 1 tablet (40 mg total) by mouth daily before breakfast. 90 tablet 3    polycarbophil (FIBERCON) 625 MG tablet Take 625 mg by mouth daily. (Patient not taking: Reported on 01/01/2023)      valACYclovir (VALTREX) 500 MG tablet Take 500 mg by mouth 2 (two) times daily. (Patient not taking: Reported on 01/01/2023)      Vitamin D, Ergocalciferol, (DRISDOL) 1.25 MG (50000 UNIT) CAPS capsule Take 50,000 Units by mouth once a week.      Scheduled:  elvitegravir-cobicistat-emtricitabine-tenofovir  1 tablet Oral QHS  ezetimibe  10 mg Oral Daily   pantoprazole (PROTONIX) IV  40 mg Intravenous Q12H   Continuous:  sodium chloride 75 mL/hr at 07/22/23 0622   ciprofloxacin     metronidazole     potassium chloride 10 mEq (07/22/23 1335)   JXB:[id number] **OR** acetaminophen, morphine injection, ondansetron **OR** ondansetron (ZOFRAN) IV, oxyCODONE  No Known Allergies   ROS:  A comprehensive review of systems was negative except for: Gastrointestinal: positive for abdominal pain  Blood pressure 131/76, pulse 88, temperature 99.7 F (37.6 C), temperature source Oral, resp. rate (!) 22, height 5\' 6"  (1.676 m), weight 106.6 kg, SpO2  97%. Physical Exam Vitals reviewed.  HENT:     Head: Normocephalic.     Nose: Nose normal.  Eyes:     Extraocular Movements: Extraocular movements intact.  Cardiovascular:     Rate and Rhythm: Normal rate and regular rhythm.  Pulmonary:     Effort: Pulmonary effort is normal.     Breath sounds: Normal breath sounds.  Abdominal:     General: There is no distension.     Palpations: Abdomen is soft.     Tenderness: There is abdominal tenderness.     Comments: Minor tenderness suprapubic   Musculoskeletal:        General: Normal range of motion.  Skin:    General: Skin is warm.  Neurological:     General: No focal deficit present.     Mental Status: He is alert and oriented to person, place, and time.  Psychiatric:        Mood and Affect: Mood normal.        Behavior: Behavior normal.        Thought Content: Thought content normal.        Judgment: Judgment normal.     Results: Results for orders placed or performed during the hospital encounter of 07/21/23 (from the past 48 hour(s))  Urinalysis, Routine w reflex microscopic -Urine, Clean Catch     Status: Abnormal   Collection Time: 07/22/23 12:28 AM  Result Value Ref Range   Color, Urine YELLOW YELLOW   APPearance CLEAR CLEAR   Specific Gravity, Urine 1.013 1.005 - 1.030   pH 5.0 5.0 - 8.0   Glucose, UA NEGATIVE NEGATIVE mg/dL   Hgb urine dipstick MODERATE (A) NEGATIVE   Bilirubin Urine NEGATIVE NEGATIVE   Ketones, ur NEGATIVE NEGATIVE mg/dL   Protein, ur NEGATIVE NEGATIVE mg/dL   Nitrite NEGATIVE NEGATIVE   Leukocytes,Ua NEGATIVE NEGATIVE   RBC / HPF 0-5 0 - 5 RBC/hpf   WBC, UA 0-5 0 - 5 WBC/hpf   Bacteria, UA NONE SEEN NONE SEEN   Squamous Epithelial / HPF 0-5 0 - 5 /HPF   Mucus PRESENT     Comment: Performed at [facility], [address] [place], [place] [address]  Basic metabolic panel     Status: Abnormal   Collection Time: 07/22/23  1:47 AM  Result Value Ref Range   Sodium 133 (L) 135 - 145 mmol/L    Potassium 3.4 (L) 3.5 - 5.1 mmol/L   Chloride 98 98 - 111 mmol/L   CO2 24 22 - 32 mmol/L   Glucose, Bld 151 (H) 70 - 99 mg/dL    Comment: Glucose reference range applies only to samples taken after fasting for at least 8 hours.   BUN 15 8 - 23 mg/dL   Creatinine, Ser 6.96 0.61 - 1.24 mg/dL   Calcium 8.5 (L) 8.9 - 10.3 mg/dL  GFR, Estimated >60 >60 mL/min    Comment: (NOTE) Calculated using the CKD-EPI Creatinine Equation (2021)    Anion gap 11 5 - 15    Comment: Performed at [facility], [address], [place], [place] [address]  CBC with Differential     Status: Abnormal   Collection Time: 07/22/23  1:47 AM  Result Value Ref Range   WBC 15.1 (H) 4.0 - 10.5 K/uL   RBC 5.17 4.22 - 5.81 MIL/uL   Hemoglobin 14.1 13.0 - 17.0 g/dL   HCT 60.4 54.0 - 98.1 %   MCV 82.4 80.0 - 100.0 fL   MCH 27.3 26.0 - 34.0 pg   MCHC 33.1 30.0 - 36.0 g/dL   RDW 19.1 47.8 - 29.5 %   Platelets 308 150 - 400 K/uL   nRBC 0.0 0.0 - 0.2 %   Neutrophils Relative % 79 %   Neutro Abs 12.0 (H) 1.7 - 7.7 K/uL   Lymphocytes Relative 13 %   Lymphs Abs 1.9 0.7 - 4.0 K/uL   Monocytes Relative 7 %   Monocytes Absolute 1.0 0.1 - 1.0 K/uL   Eosinophils Relative 0 %   Eosinophils Absolute 0.1 0.0 - 0.5 K/uL   Basophils Relative 0 %   Basophils Absolute 0.0 0.0 - 0.1 K/uL   Immature Granulocytes 1 %   Abs Immature Granulocytes 0.07 0.00 - 0.07 K/uL    Comment: Performed at [facility], [address] [place], [place] [address]  Comprehensive metabolic panel     Status: Abnormal   Collection Time: 07/22/23  5:30 AM  Result Value Ref Range   Sodium 131 (L) 135 - 145 mmol/L   Potassium 3.4 (L) 3.5 - 5.1 mmol/L   Chloride 99 98 - 111 mmol/L   CO2 23 22 - 32 mmol/L   Glucose, Bld 182 (H) 70 - 99 mg/dL    Comment: Glucose reference range applies only to samples taken after fasting for at least 8 hours.   BUN 12 8 - 23 mg/dL   Creatinine, Ser 8.65 0.61 - 1.24 mg/dL   Calcium 8.3 (L) 8.9 - 10.3 mg/dL   Total  Protein 6.4 (L) 6.5 - 8.1 g/dL   Albumin 3.6 3.5 - 5.0 g/dL   AST 18 15 - 41 U/L   ALT 26 0 - 44 U/L   Alkaline Phosphatase 65 38 - 126 U/L   Total Bilirubin 0.7 0.3 - 1.2 mg/dL   GFR, Estimated >78 >46 mL/min    Comment: (NOTE) Calculated using the CKD-EPI Creatinine Equation (2021)    Anion gap 9 5 - 15    Comment: Performed at [facility], [address], [place], [place] [address]  Magnesium     Status: Abnormal   Collection Time: 07/22/23  5:30 AM  Result Value Ref Range   Magnesium 1.6 (L) 1.7 - 2.4 mg/dL    Comment: Performed at [facility], [place], [address], [place], [place] [address]  CBC with Differential/Platelet     Status: Abnormal   Collection Time: 07/22/23  5:30 AM  Result Value Ref Range   WBC 16.6 (H) 4.0 - 10.5 K/uL   RBC 4.85 4.22 - 5.81 MIL/uL   Hemoglobin 13.3 13.0 - 17.0 g/dL   HCT 24.4 01.0 - 27.2 %   MCV 82.3 80.0 - 100.0 fL   MCH 27.4 26.0 - 34.0 pg   MCHC 33.3 30.0 - 36.0 g/dL   RDW 53.6 64.4 - 03.4 %   Platelets 278 150 -  400 K/uL   nRBC 0.0 0.0 - 0.2 %   Neutrophils Relative % 80 %   Neutro Abs 13.5 (H) 1.7 - 7.7 K/uL   Lymphocytes Relative 12 %   Lymphs Abs 2.0 0.7 - 4.0 K/uL   Monocytes Relative 7 %   Monocytes Absolute 1.1 (H) 0.1 - 1.0 K/uL   Eosinophils Relative 0 %   Eosinophils Absolute 0.0 0.0 - 0.5 K/uL   Basophils Relative 0 %   Basophils Absolute 0.0 0.0 - 0.1 K/uL   Immature Granulocytes 1 %   Abs Immature Granulocytes 0.08 (H) 0.00 - 0.07 K/uL    Comment: Performed at [facility], [address] [place], [place] [address]   Personally reviewed and showed the patient- inflamed area and small 1cm ovoid looking collection? Abscess versus diverticula- I think likely small abscess  CT ABDOMEN PELVIS W CONTRAST  Result Date: 07/22/2023 CLINICAL DATA:  Acute left inguinal pain for 2 days, initial encounter EXAM: CT ABDOMEN AND PELVIS WITH CONTRAST TECHNIQUE: Multidetector CT imaging of the abdomen and pelvis was performed using the  standard protocol following bolus administration of intravenous contrast. RADIATION DOSE REDUCTION: This exam was performed according to the departmental dose-optimization program which includes automated exposure control, adjustment of the mA and/or kV according to patient size and/or use of iterative reconstruction technique. CONTRAST:  OMNIPAQUE IOHEXOL 300 MG/ML  SOLN COMPARISON:  04/01/2020 FINDINGS: Lower chest: Calcified granuloma is noted in the right lung base stable from the prior exam. Hepatobiliary: No focal liver abnormality is seen. No gallstones, gallbladder wall thickening, or biliary dilatation. Pancreas: Unremarkable. No pancreatic ductal dilatation or surrounding inflammatory changes. Spleen: Normal in size without focal abnormality. Adrenals/Urinary Tract: Adrenal glands are within normal limits. Kidneys demonstrate a normal enhancement pattern bilaterally. No renal calculi or obstructive changes are seen. The ureters are within normal limits. The bladder is partially distended. Stomach/Bowel: Diverticular change of the colon is noted with evidence of diverticulitis at the rectosigmoid junction. A small 1 cm fluid collection is noted just beneath the sigmoid in the fatty space between the colon and the bladder. This is best visualized on coronal image number 78. Considerable inflammatory changes are noted. No sizable abscess is identified. The more proximal colon is within normal limits. The appendix is unremarkable. Small bowel and stomach are within normal limits. Vascular/Lymphatic: No significant vascular findings are present. No enlarged abdominal or pelvic lymph nodes. Reproductive: Prostate is unremarkable. Other: No abdominal wall hernia or abnormality. No abdominopelvic ascites. Musculoskeletal: No acute or significant osseous findings. IMPRESSION: Changes consistent with acute sigmoid diverticulitis with a small extraluminal fluid collection which may represent a developing  abscess. It measures 1 cm in greatest dimension lying between the sigmoid and the bladder as described. No other focal abnormality is noted. Electronically Signed   By: Alcide Clever M.D.   On: 07/22/2023 02:53     Assessment & Plan:  Luis Gomez is a 67 y.o. male with diverticulitis. He is improving with antibiotics. He is hungry.  -PRN for pain -Soft diet/ low fiber  -IV antibiotics -Likely here for 48 hours   All questions were answered to the satisfaction of the patient.    Lucretia Roers 07/22/2023, 1:45 PM

## 2023-07-23 DIAGNOSIS — K5732 Diverticulitis of large intestine without perforation or abscess without bleeding: Secondary | ICD-10-CM | POA: Diagnosis not present

## 2023-07-23 DIAGNOSIS — E876 Hypokalemia: Secondary | ICD-10-CM | POA: Diagnosis not present

## 2023-07-23 LAB — CBC WITH DIFFERENTIAL/PLATELET
Abs Immature Granulocytes: 0.1 10*3/uL — ABNORMAL HIGH (ref 0.00–0.07)
Basophils Absolute: 0.1 10*3/uL (ref 0.0–0.1)
Basophils Relative: 0 %
Eosinophils Absolute: 0.2 10*3/uL (ref 0.0–0.5)
Eosinophils Relative: 1 %
HCT: 41.1 % (ref 39.0–52.0)
Hemoglobin: 13.5 g/dL (ref 13.0–17.0)
Immature Granulocytes: 1 %
Lymphocytes Relative: 13 %
Lymphs Abs: 2.3 10*3/uL (ref 0.7–4.0)
MCH: 27.4 pg (ref 26.0–34.0)
MCHC: 32.8 g/dL (ref 30.0–36.0)
MCV: 83.4 fL (ref 80.0–100.0)
Monocytes Absolute: 1.2 10*3/uL — ABNORMAL HIGH (ref 0.1–1.0)
Monocytes Relative: 6 %
Neutro Abs: 14.7 10*3/uL — ABNORMAL HIGH (ref 1.7–7.7)
Neutrophils Relative %: 79 %
Platelets: 292 10*3/uL (ref 150–400)
RBC: 4.93 MIL/uL (ref 4.22–5.81)
RDW: 13.3 % (ref 11.5–15.5)
WBC: 18.5 10*3/uL — ABNORMAL HIGH (ref 4.0–10.5)
nRBC: 0 % (ref 0.0–0.2)

## 2023-07-23 LAB — COMPREHENSIVE METABOLIC PANEL
ALT: 20 U/L (ref 0–44)
AST: 13 U/L — ABNORMAL LOW (ref 15–41)
Albumin: 3.4 g/dL — ABNORMAL LOW (ref 3.5–5.0)
Alkaline Phosphatase: 63 U/L (ref 38–126)
Anion gap: 10 (ref 5–15)
BUN: 7 mg/dL — ABNORMAL LOW (ref 8–23)
CO2: 22 mmol/L (ref 22–32)
Calcium: 7.9 mg/dL — ABNORMAL LOW (ref 8.9–10.3)
Chloride: 99 mmol/L (ref 98–111)
Creatinine, Ser: 0.84 mg/dL (ref 0.61–1.24)
GFR, Estimated: >60 mL/min (ref >60)
Glucose, Bld: 138 mg/dL — ABNORMAL HIGH (ref 70–99)
Potassium: 3.5 mmol/L (ref 3.5–5.1)
Sodium: 131 mmol/L — ABNORMAL LOW (ref 135–145)
Total Bilirubin: 1.2 mg/dL (ref 0.3–1.2)
Total Protein: 6.5 g/dL (ref 6.5–8.1)

## 2023-07-23 LAB — MAGNESIUM: Magnesium: 2.2 mg/dL (ref 1.7–2.4)

## 2023-07-23 MED ORDER — DOCUSATE SODIUM 100 MG PO CAPS
100.0000 mg | ORAL_CAPSULE | Freq: Two times a day (BID) | ORAL | Status: DC
  Administered 2023-07-23 – 2023-07-24 (×3): 100 mg via ORAL
  Filled 2023-07-23 (×3): qty 1

## 2023-07-23 MED ORDER — INFLUENZA VAC A&B SURF ANT ADJ 0.5 ML IM SUSY
0.5000 mL | PREFILLED_SYRINGE | Freq: Once | INTRAMUSCULAR | Status: AC
  Administered 2023-07-24: 0.5 mL via INTRAMUSCULAR
  Filled 2023-07-23: qty 0.5

## 2023-07-23 NOTE — Plan of Care (Signed)
Problem: Education: Goal: Knowledge of General Education information will improve Description: Including pain rating scale, medication(s)/side effects and non-pharmacologic comfort measures Outcome: Progressing   Problem: Health Behavior/Discharge Planning: Goal: Ability to manage health-related needs will improve Outcome: Progressing   Problem: Clinical Measurements: Goal: Will remain free from infection Outcome: Progressing

## 2023-07-23 NOTE — Progress Notes (Signed)
[facility] Progress Note     Subjective: Doing well. Less pain. Overall feeling better.   Objective: Vital signs in last 24 hours: Temp:  [99.2 F (37.3 C)-100.6 F (38.1 C)] 99.3 F (37.4 C) (09/17 1500) Pulse Rate:  [94-101] 94 (09/17 1500) Resp:  [22-25] 22 (09/17 1500) BP: (124-140)/(76-85) 124/85 (09/17 1500) SpO2:  [96 %-98 %] 97 % (09/17 1500)    Intake/Output from previous day: 09/16 0701 - 09/17 0700 In: 1274.2 [I.V.:747.4; IV Piggyback:526.8] Out: -  Intake/Output this shift: Total I/O In: 1414.1 [I.V.:1014.1; IV Piggyback:400] Out: -   General appearance: alert and no distress GI: soft, less tender suprapubic area   Lab Results:  Recent Labs    07/22/23 0530 07/23/23 0407  WBC 16.6* 18.5*  HGB 13.3 13.5  HCT 39.9 41.1  PLT 278 292   BMET Recent Labs    07/22/23 0530 07/23/23 0407  NA 131* 131*  K 3.4* 3.5  CL 99 99  CO2 23 22  GLUCOSE 182* 138*  BUN 12 7*  CREATININE 0.99 0.84  CALCIUM 8.3* 7.9*   PT/INR No results for input(s): "LABPROT", "INR" in the last 72 hours.  Studies/Results: CT ABDOMEN PELVIS W CONTRAST  Result Date: 07/22/2023 CLINICAL DATA:  Acute left inguinal pain for 2 days, initial encounter EXAM: CT ABDOMEN AND PELVIS WITH CONTRAST TECHNIQUE: Multidetector CT imaging of the abdomen and pelvis was performed using the standard protocol following bolus administration of intravenous contrast. RADIATION DOSE REDUCTION: This exam was performed according to the departmental dose-optimization program which includes automated exposure control, adjustment of the mA and/or kV according to patient size and/or use of iterative reconstruction technique. CONTRAST:  OMNIPAQUE IOHEXOL 300 MG/ML  SOLN COMPARISON:  04/01/2020 FINDINGS: Lower chest: Calcified granuloma is noted in the right lung base stable from the prior exam. Hepatobiliary: No focal liver abnormality is seen. No gallstones, gallbladder wall thickening, or  biliary dilatation. Pancreas: Unremarkable. No pancreatic ductal dilatation or surrounding inflammatory changes. Spleen: Normal in size without focal abnormality. Adrenals/Urinary Tract: Adrenal glands are within normal limits. Kidneys demonstrate a normal enhancement pattern bilaterally. No renal calculi or obstructive changes are seen. The ureters are within normal limits. The bladder is partially distended. Stomach/Bowel: Diverticular change of the colon is noted with evidence of diverticulitis at the rectosigmoid junction. A small 1 cm fluid collection is noted just beneath the sigmoid in the fatty space between the colon and the bladder. This is best visualized on coronal image number 78. Considerable inflammatory changes are noted. No sizable abscess is identified. The more proximal colon is within normal limits. The appendix is unremarkable. Small bowel and stomach are within normal limits. Vascular/Lymphatic: No significant vascular findings are present. No enlarged abdominal or pelvic lymph nodes. Reproductive: Prostate is unremarkable. Other: No abdominal wall hernia or abnormality. No abdominopelvic ascites. Musculoskeletal: No acute or significant osseous findings. IMPRESSION: Changes consistent with acute sigmoid diverticulitis with a small extraluminal fluid collection which may represent a developing abscess. It measures 1 cm in greatest dimension lying between the sigmoid and the bladder as described. No other focal abnormality is noted. Electronically Signed   By: Alcide Clever M.D.   On: 07/22/2023 02:53    Anti-infectives: Anti-infectives (From admission, onward)    Start     Dose/Rate Route Frequency Ordered Stop   07/22/23 1700  metroNIDAZOLE (FLAGYL) IVPB 500 mg        500 mg 100 mL/hr over 60 Minutes Intravenous Every 12 hours 07/22/23  0401     07/22/23 1600  ciprofloxacin (CIPRO) IVPB 400 mg        400 mg 200 mL/hr over 60 Minutes Intravenous Every 12 hours 07/22/23 0401      07/22/23 1216  elvitegravir-cobicistat-emtricitabine-tenofovir ([name]) 150-150-200-10 MG tablet 1 tablet        1 tablet Oral Daily at bedtime 07/22/23 1216     07/22/23 1000  elvitegravir-cobicistat-emtricitabine-tenofovir ([name]) 150-150-200-10 MG tablet 1 tablet  Status:  Discontinued        1 tablet Oral Daily 07/22/23 0417 07/22/23 0418   07/22/23 0800  elvitegravir-cobicistat-emtricitabine-tenofovir ([name]) 150-150-200-10 MG tablet 1 tablet  Status:  Discontinued        1 tablet Oral Daily with breakfast 07/22/23 0419 07/22/23 1216   07/22/23 0345  ciprofloxacin (CIPRO) IVPB 400 mg        400 mg 200 mL/hr over 60 Minutes Intravenous  Once 07/22/23 0332 07/22/23 0517   07/22/23 0345  metroNIDAZOLE (FLAGYL) IVPB 500 mg        500 mg 100 mL/hr over 60 Minutes Intravenous  Once 07/22/23 1610 07/22/23 0618       Assessment/Plan: Patient with sigmoid diverticulitis. Doing well. WBC up some. Patient did not want to up the diet yet. PRN for pain Diet as tolerated IV antibiotics  Updated team    LOS: 1 day    Luis Gomez 07/23/2023

## 2023-07-23 NOTE — Progress Notes (Signed)
PROGRESS NOTE   Luis Gomez  FAO:[id number] DOB: [birth year] DOA: 07/21/2023 PCP: Assunta Found, MD   Chief Complaint  Patient presents with   Groin Pain   Level of care: Med-Surg  Brief Admission History:   67 y.o. male with medical history significant of HIV, genital HSV, GERD, diverticulosis, and more presents the ED with a chief complaint of abdominal pain.  Patient reports the abdominal pain that started the day prior to presentation.  It is intermittent.  He had no fever.  The pain was unpredictable.  He is not sure if eating would bring it on because he has not ate.  He then says his last meal was Sunday afternoon.  So it is unclear when he last ate.  Last normal bowel movement was Sunday at 7 PM.  He had no nausea or vomiting.  He has had similar abdominal pain several years ago.  He does not remember what the doctor said about it, but he thinks he may have done a surgery at that time.  Patient has no other complaints at this time.   This patient is Spanish-speaking and the history was collected with the use of a qualified medical Spanish tele-interpreter   Assessment and Plan: Sigmoid diverticulitis - CT abdomen pelvis shows sigmoid diverticulitis with a small fluid collection that could be developing abscess  - He is responding well to IV antibiotics, he would like to continue full liquid diet for now  Hypokalemia - repleted  Hypomagnesemia - repleted   SIRS (systemic inflammatory response syndrome)  - resolved now  Hyperlipidemia - Continue Zetia  GERD - Continue Protonix  Human immunodeficiency virus (HIV) disease  - Continue [name] taken every evening per pt request like he does at home   DVT prophylaxis: SCDs Code Status: Full  Communication: Qualified Medical Spanish Interpreter Disposition: possible DC home tomorrow    Consultants:  Surgery  Procedures:   Antimicrobials:  Cipro/flagyl   Subjective: Pain is much less intense  today  Objective: Vitals:   07/22/23 1751 07/22/23 2112 07/23/23 0408 07/23/23 1500  BP: 124/79 124/76 (!) 140/85 124/85  Pulse: (!) 101 94 95 94  Resp: (!) 25 (!) 24 (!) 22 (!) 22  Temp: (!) 100.6 F (38.1 C) 99.8 F (37.7 C) 99.2 F (37.3 C) 99.3 F (37.4 C)  TempSrc: Oral Oral Oral Oral  SpO2: 96% 98% 97% 97%  Weight:      Height:        Intake/Output Summary (Last 24 hours) at 07/23/2023 1736 Last data filed at 07/23/2023 1209 Gross per 24 hour  Intake 1414.14 ml  Output --  Net 1414.14 ml   Filed Weights   07/21/23 2301  Weight: 106.6 kg   Examination:  General exam: Appears calm and comfortable  Respiratory system: Clear to auscultation. Respiratory effort normal. Cardiovascular system: normal S1 & S2 heard. No JVD, murmurs, rubs, gallops or clicks. No pedal edema. Gastrointestinal system: Abdomen is nondistended, soft and mild LLQ tenderness. No organomegaly or masses felt. Normal bowel sounds heard. Central nervous system: Alert and oriented. No focal neurological deficits. Extremities: Symmetric 5 x 5 power. Skin: No rashes, lesions or ulcers. Psychiatry: Judgement and insight appear normal. Mood & affect appropriate.   Data Reviewed: I have personally reviewed following labs and imaging studies  CBC: Recent Labs  Lab 07/22/23 0147 07/22/23 0530 07/23/23 0407  WBC 15.1* 16.6* 18.5*  NEUTROABS 12.0* 13.5* 14.7*  HGB 14.1 13.3 13.5  HCT 42.6 39.9 41.1  MCV  82.4 82.3 83.4  PLT 308 278 292    Basic Metabolic Panel: Recent Labs  Lab 07/22/23 0147 07/22/23 0530 07/23/23 0407  NA 133* 131* 131*  K 3.4* 3.4* 3.5  CL 98 99 99  CO2 24 23 22   GLUCOSE 151* 182* 138*  BUN 15 12 7*  CREATININE 1.05 0.99 0.84  CALCIUM 8.5* 8.3* 7.9*  MG  --  1.6* 2.2    CBG: No results for input(s): "GLUCAP" in the last 168 hours.  No results found for this or any previous visit (from the past 240 hour(s)).   Radiology Studies: CT ABDOMEN PELVIS W  CONTRAST  Result Date: 07/22/2023 CLINICAL DATA:  Acute left inguinal pain for 2 days, initial encounter EXAM: CT ABDOMEN AND PELVIS WITH CONTRAST TECHNIQUE: Multidetector CT imaging of the abdomen and pelvis was performed using the standard protocol following bolus administration of intravenous contrast. RADIATION DOSE REDUCTION: This exam was performed according to the departmental dose-optimization program which includes automated exposure control, adjustment of the mA and/or kV according to patient size and/or use of iterative reconstruction technique. CONTRAST:  OMNIPAQUE IOHEXOL 300 MG/ML  SOLN COMPARISON:  04/01/2020 FINDINGS: Lower chest: Calcified granuloma is noted in the right lung base stable from the prior exam. Hepatobiliary: No focal liver abnormality is seen. No gallstones, gallbladder wall thickening, or biliary dilatation. Pancreas: Unremarkable. No pancreatic ductal dilatation or surrounding inflammatory changes. Spleen: Normal in size without focal abnormality. Adrenals/Urinary Tract: Adrenal glands are within normal limits. Kidneys demonstrate a normal enhancement pattern bilaterally. No renal calculi or obstructive changes are seen. The ureters are within normal limits. The bladder is partially distended. Stomach/Bowel: Diverticular change of the colon is noted with evidence of diverticulitis at the rectosigmoid junction. A small 1 cm fluid collection is noted just beneath the sigmoid in the fatty space between the colon and the bladder. This is best visualized on coronal image number 78. Considerable inflammatory changes are noted. No sizable abscess is identified. The more proximal colon is within normal limits. The appendix is unremarkable. Small bowel and stomach are within normal limits. Vascular/Lymphatic: No significant vascular findings are present. No enlarged abdominal or pelvic lymph nodes. Reproductive: Prostate is unremarkable. Other: No abdominal wall hernia or  abnormality. No abdominopelvic ascites. Musculoskeletal: No acute or significant osseous findings. IMPRESSION: Changes consistent with acute sigmoid diverticulitis with a small extraluminal fluid collection which may represent a developing abscess. It measures 1 cm in greatest dimension lying between the sigmoid and the bladder as described. No other focal abnormality is noted. Electronically Signed   By: Alcide Clever M.D.   On: 07/22/2023 02:53    Scheduled Meds:  docusate sodium  100 mg Oral BID   elvitegravir-cobicistat-emtricitabine-tenofovir  1 tablet Oral QHS   ezetimibe  10 mg Oral Daily   influenza vaccine adjuvanted  0.5 mL Intramuscular Once   pantoprazole (PROTONIX) IV  40 mg Intravenous Q12H   Continuous Infusions:  sodium chloride 1,000 mL (07/23/23 1216)   ciprofloxacin 400 mg (07/23/23 1557)   metronidazole 500 mg (07/23/23 0540)     LOS: 1 day   Time spent: 40 mins  Sharion Grieves Laural Benes, MD How to contact the [facility] Attending or Consulting provider 7A - 7P or covering provider during after hours 7P -7A, for this patient?  Check the care team in [facility] and look for a) attending/consulting [facility] provider listed and b) the [facility] team listed Log into [email or web address] and use [facility] universal password to access. If you do  not have the password, please contact the hospital operator. Locate the [facility] provider you are looking for under [facility] and page to a number that you can be directly reached. If you still have difficulty reaching the provider, please page the [facility] (Director on Call) for the Hospitalists listed on amion for assistance.  07/23/2023, 5:36 PM

## 2023-07-24 DIAGNOSIS — K5720 Diverticulitis of large intestine with perforation and abscess without bleeding: Principal | ICD-10-CM

## 2023-07-24 DIAGNOSIS — B20 Human immunodeficiency virus [HIV] disease: Secondary | ICD-10-CM | POA: Diagnosis not present

## 2023-07-24 DIAGNOSIS — K5732 Diverticulitis of large intestine without perforation or abscess without bleeding: Secondary | ICD-10-CM | POA: Diagnosis not present

## 2023-07-24 MED ORDER — GENVOYA 150-150-200-10 MG PO TABS: 1.0000 | ORAL_TABLET | Freq: Every day | ORAL | Status: DC

## 2023-07-24 MED ORDER — POTASSIUM CHLORIDE CRYS ER 20 MEQ PO TBCR
20.0000 meq | EXTENDED_RELEASE_TABLET | Freq: Once | ORAL | Status: AC
  Administered 2023-07-24: 20 meq via ORAL
  Filled 2023-07-24: qty 1

## 2023-07-24 MED ORDER — AMOXICILLIN-POT CLAVULANATE 875-125 MG PO TABS
1.0000 | ORAL_TABLET | Freq: Two times a day (BID) | ORAL | Status: DC
  Administered 2023-07-24: 1 via ORAL
  Filled 2023-07-24: qty 1

## 2023-07-24 MED ORDER — AMOXICILLIN-POT CLAVULANATE 875-125 MG PO TABS: 1.0000 | ORAL_TABLET | Freq: Two times a day (BID) | ORAL | 0 refills | Status: DC

## 2023-07-24 MED ORDER — OXYCODONE HCL 5 MG PO TABS: 5.0000 mg | ORAL_TABLET | ORAL | 0 refills | Status: DC | PRN

## 2023-07-24 NOTE — Care Management Important Message (Signed)
Important Message  Patient Details  Name: Luis Gomez MRN: [id number] Date of Birth: [birth year]   Medicare Important Message Given:  N/A - LOS <3 / Initial given by admissions     Corey Harold 07/24/2023, 1:19 PM

## 2023-07-24 NOTE — Progress Notes (Signed)
[facility]   Augmentin to complete 10 day course.    Future Appointments  Date Time Provider Department Center  08/27/2023 11:30 AM Lucretia Roers, MD [facility] None  09/24/2023  8:45 AM Comer, Belia Heman, MD [facility]    Diverticulosis/ Diverticulitis Information and Diet:   Diverticulosis is a condition in which small, bulging pouches (diverticuli) form inside the lower part of the intestine, usually in the colon. Constipation and straining during bowel movements can worsen the condition. A diet rich in fiber can help keep stools soft and prevent inflammation.  Diverticulitis occurs when the pouches in the colon become infected or inflamed. Dietary changes can help the colon heal.  Fiber is an important part of the diet for patients with diverticulosis. A high-fiber diet softens and gives bulk to the stool, allowing it to pass quickly and easily.  Diet for Diverticulosis Eat a high-fiber diet when you have diverticulosis. Fiber softens the stool and helps prevent constipation. It also can help decrease pressure in the colon and help prevent flare-ups of diverticulitis.  High-fiber foods include:  Beans and legumes Bran, whole wheat bread and whole grain cereals such as oatmeal Brown and wild rice Fruits such as apples, bananas and pears Vegetables such as broccoli, carrots, corn and squash Whole wheat pasta If you currently don't have a diet high in fiber, you should add fiber gradually. This helps avoid bloating and abdominal discomfort. The target is to eat 25 to 30 grams of fiber daily. Drink at least 8 cups of fluid daily. Fluid will help soften your stool. Exercise also promotes bowel movement and helps prevent constipation.  When the colon is not inflamed, eat popcorn, nuts and seeds as tolerated.  Diet for Diverticulitis During flare ups of diverticulitis, follow a clear liquid diet. Your doctor will let you know when to progress from clear liquids  to low fiber solids and then back to your normal diet.  A clear liquid diet means no solid foods. Juices should have no pulp. During the clear liquid diet, you may consume:  Broth Clear juices such as apple, cranberry and grape. (Avoid orange juice) Jell-O Popsicles  When you're able to eat solid food, choose low fiber foods while healing. Low fiber foods include:  Canned or cooked fruit without seeds or skin, such as applesauce and melon Canned or well cooked vegetables without seeds and skin Dairy products such as cheese, milk and yogurt Eggs Low-fiber cereal Meat that is ground or tender and well cooked Pasta White bread and white rice AVOID RED MEAT WHILE YOU HAVE AN ACTIVE FLARE.  AVOID NSAIDS, IBUPROFEN, ALEVE, ASPIRIN WHILE YOU HAVE AN ACTIVE FLARE.  EXERCISE AS WELL AS SMOKING CESSATION CAN HELP PREVENT RECURRENCES.   After symptoms improve, usually within two to four days, you may add 5 to 15 grams of fiber a day back into your diet. Resume your high fiber diet when you no longer have symptoms.

## 2023-07-24 NOTE — Discharge Summary (Signed)
Physician Discharge Summary   Patient: Luis Gomez MRN: [id number] DOB: 1956/07/03  Admit date:     07/21/2023  Discharge date: 07/24/23  Discharge Physician: Onalee Hua Milka Windholz   PCP: Assunta Found, MD   Recommendations at discharge:   Please follow up with primary care provider within 1-2 weeks  Please repeat BMP and CBC in one week     Hospital Course:  67 y.o. male with medical history significant of HIV, genital HSV, GERD, diverticulosis, and more presents the ED with a chief complaint of abdominal pain.  Patient reports the abdominal pain that started the day prior to presentation.  It is intermittent.  He had no fever.  The pain was unpredictable.  He is not sure if eating would bring it on because he has not ate.  He then says his last meal was Sunday afternoon.  So it is unclear when he last ate.  Last normal bowel movement was Sunday at 7 PM.  He had no nausea or vomiting.  He has had similar abdominal pain several years ago.  He does not remember what the doctor said about it, but he thinks he may have done a surgery at that time.  Patient has no other complaints at this time.   This patient is Spanish-speaking and the history was collected with the use of a qualified medical Spanish tele-interpreter  Assessment and Plan:  Sigmoid diverticulitis - CT abdomen pelvis shows sigmoid diverticulitis with a small fluid collection that could be developing abscess --IR was consulted and they felt like this was a diverticula and not abscess   - He is responding well to IV antibiotics, he would like to continue full liquid diet for now --diet advanced to soft diet which he tolerated --d/c home with amox/clav x 8 more days   Hypokalemia - repleted   Hypomagnesemia - repleted    SIRS (systemic inflammatory response syndrome)  - resolved now   Hyperlipidemia - Continue Zetia   GERD - Continue Protonix   Human immunodeficiency virus (HIV) disease  - Continue [name] taken  every evening per pt request like he does at home       Consultants: general surgery Procedures performed: none  Disposition: Home Diet recommendation:  Soft diet DISCHARGE MEDICATION: Allergies as of 07/24/2023   No Known Allergies      Medication List     STOP taking these medications    benzonatate 200 MG capsule Commonly known as: TESSALON       TAKE these medications    amoxicillin-clavulanate 875-125 MG tablet Commonly known as: AUGMENTIN Take 1 tablet by mouth every 12 (twelve) hours.   [name] 150-150-200-10 MG Tabs tablet Generic drug: elvitegravir-cobicistat-emtricitabine-tenofovir Tome 1 tableta por va oral diariamente. (TAKE 1 TABLET BY MOUTH DAILY)   [name] 150-150-200-10 MG Tabs tablet Generic drug: elvitegravir-cobicistat-emtricitabine-tenofovir Tome 1 tableta por va oral diariamente. (TAKE 1 TABLET BY MOUTH DAILY)   methylcellulose oral powder Take 1 packet by mouth daily.   oxyCODONE 5 MG immediate release tablet Commonly known as: Oxy IR/ROXICODONE Take 1 tablet (5 mg total) by mouth every 4 (four) hours as needed for moderate pain.   pantoprazole 40 MG tablet Commonly known as: PROTONIX Take 1 tablet (40 mg total) by mouth daily before breakfast.        Discharge Exam: Filed Weights   07/21/23 2301  Weight: 106.6 kg   HEENT:  /AT, No thrush, no icterus CV:  RRR, no rub, no S3, no S4 Lung:  CTA,  no wheeze, no rhonchi Abd:  soft/+BS, NT Ext:  No edema, no lymphangitis, no synovitis, no rash   Condition at discharge: stable  The results of significant diagnostics from this hospitalization (including imaging, microbiology, ancillary and laboratory) are listed below for reference.   Imaging Studies: CT ABDOMEN PELVIS W CONTRAST  Result Date: 07/22/2023 CLINICAL DATA:  Acute left inguinal pain for 2 days, initial encounter EXAM: CT ABDOMEN AND PELVIS WITH CONTRAST TECHNIQUE: Multidetector CT imaging of the abdomen and  pelvis was performed using the standard protocol following bolus administration of intravenous contrast. RADIATION DOSE REDUCTION: This exam was performed according to the departmental dose-optimization program which includes automated exposure control, adjustment of the mA and/or kV according to patient size and/or use of iterative reconstruction technique. CONTRAST:  OMNIPAQUE IOHEXOL 300 MG/ML  SOLN COMPARISON:  04/01/2020 FINDINGS: Lower chest: Calcified granuloma is noted in the right lung base stable from the prior exam. Hepatobiliary: No focal liver abnormality is seen. No gallstones, gallbladder wall thickening, or biliary dilatation. Pancreas: Unremarkable. No pancreatic ductal dilatation or surrounding inflammatory changes. Spleen: Normal in size without focal abnormality. Adrenals/Urinary Tract: Adrenal glands are within normal limits. Kidneys demonstrate a normal enhancement pattern bilaterally. No renal calculi or obstructive changes are seen. The ureters are within normal limits. The bladder is partially distended. Stomach/Bowel: Diverticular change of the colon is noted with evidence of diverticulitis at the rectosigmoid junction. A small 1 cm fluid collection is noted just beneath the sigmoid in the fatty space between the colon and the bladder. This is best visualized on coronal image number 78. Considerable inflammatory changes are noted. No sizable abscess is identified. The more proximal colon is within normal limits. The appendix is unremarkable. Small bowel and stomach are within normal limits. Vascular/Lymphatic: No significant vascular findings are present. No enlarged abdominal or pelvic lymph nodes. Reproductive: Prostate is unremarkable. Other: No abdominal wall hernia or abnormality. No abdominopelvic ascites. Musculoskeletal: No acute or significant osseous findings. IMPRESSION: Changes consistent with acute sigmoid diverticulitis with a small extraluminal fluid collection which  may represent a developing abscess. It measures 1 cm in greatest dimension lying between the sigmoid and the bladder as described. No other focal abnormality is noted. Electronically Signed   By: Alcide Clever M.D.   On: 07/22/2023 02:53    Microbiology: Results for orders placed or performed during the hospital encounter of 09/13/20  SARS CORONAVIRUS 2 ([place] 6-24 HRS) Nasopharyngeal Nasopharyngeal Swab     Status: None   Collection Time: 09/13/20  9:00 AM   Specimen: Nasopharyngeal Swab  Result Value Ref Range Status   SARS Coronavirus 2 NEGATIVE NEGATIVE Final    Comment: (NOTE) SARS-CoV-2 target nucleic acids are NOT DETECTED.  The SARS-CoV-2 RNA is generally detectable in upper and lower respiratory specimens during the acute phase of infection. Negative results do not preclude SARS-CoV-2 infection, do not rule out co-infections with other pathogens, and should not be used as the sole basis for treatment or other patient management decisions. Negative results must be combined with clinical observations, patient history, and epidemiological information. The expected result is Negative.  Fact Sheet for Patients: HairSlick.no  Fact Sheet for Healthcare Providers: quierodirigir.com  This test is not yet approved or cleared by the [place] FDA and  has been authorized for detection and/or diagnosis of SARS-CoV-2 by FDA under an Emergency Use Authorization (EUA). This EUA will remain  in effect (meaning this test can be used) for the duration of the COVID-19 declaration  under Se ction 564(b)(1) of the Act, 21 U.S.C. section 360bbb-3(b)(1), unless the authorization is terminated or revoked sooner.  Performed at [facility], [address], [place], [place] [address]     Labs: CBC: Recent Labs  Lab 07/22/23 0147 07/22/23 0530 07/23/23 0407 07/24/23 0444  WBC 15.1* 16.6* 18.5* 15.2*  NEUTROABS 12.0* 13.5* 14.7*  12.1*  HGB 14.1 13.3 13.5 12.7*  HCT 42.6 39.9 41.1 39.4  MCV 82.4 82.3 83.4 83.8  PLT 308 278 292 295   Basic Metabolic Panel: Recent Labs  Lab 07/22/23 0147 07/22/23 0530 07/23/23 0407 07/24/23 0444  NA 133* 131* 131* 131*  K 3.4* 3.4* 3.5 3.3*  CL 98 99 99 99  CO2 24 23 22 23   GLUCOSE 151* 182* 138* 166*  BUN 15 12 7* 9  CREATININE 1.05 0.99 0.84 0.86  CALCIUM 8.5* 8.3* 7.9* 7.8*  MG  --  1.6* 2.2  --    Liver Function Tests: Recent Labs  Lab 07/22/23 0530 07/23/23 0407 07/24/23 0444  AST 18 13* 12*  ALT 26 20 16   ALKPHOS 65 63 57  BILITOT 0.7 1.2 0.9  PROT 6.4* 6.5 6.1*  ALBUMIN 3.6 3.4* 3.1*   CBG: No results for input(s): "GLUCAP" in the last 168 hours.  Discharge time spent: greater than 30 minutes.  Signed: Catarina Hartshorn, MD [facility] 07/24/2023

## 2023-07-24 NOTE — Discharge Instructions (Signed)
Diverticulosis/Diverticulitis Informacin y Dieta:   La diverticulosis es una afeccin en la que se forman pequeas bolsas abultadas (divertculos) dentro de la parte inferior del intestino, generalmente en el colon. El estreimiento y el esfuerzo al defecar pueden empeorar la afeccin. Una dieta rica en fibra puede ayudar a mantener las heces blandas y prevenir la inflamacin.  La diverticulitis ocurre cuando las bolsas del colon se infectan o inflaman. Los [employer] en la dieta pueden ayudar a Education officer, community colon.  La fibra es una parte importante de la dieta de los pacientes con diverticulosis. Una dieta rica en fibra suaviza y da volumen a las heces, permitindoles pasar rpida y fcilmente.  Dieta para la diverticulosis Consuma una dieta rica en fibra cuando tenga diverticulosis. La fibra Intel heces y [place] a Banker. Tambin puede ayudar a disminuir la presin en el colon y ayudar a prevenir brotes de diverticulitis.  Los alimentos ricos en fibra incluyen:  Frijoles y legumbres Salvado, pan integral y cereales integrales como la avena. Arroz integral y salvaje [employer], pltanos y peras. Verduras como brcoli, zanahorias, maz y calabaza. pasta integral Si actualmente no tienes Pension scheme manager, debes agregar fibra gradualmente. Esto ayuda a Transport planner hinchazn y las molestias abdominales. El objetivo es consumir de 25 a 30 gramos de Tax adviser. Beba al menos 8 tazas de lquido al [employer]. El lquido ayudar a [employer]. El ejercicio tambin promueve la evacuacin intestinal y [place] a Banker.  Cuando el colon no est inflamado, coma palomitas de maz, nueces y semillas segn lo tolere.  Dieta para la diverticulitis Durante los brotes de diverticulitis, siga una dieta de lquidos claros. Su mdico le informar cundo pasar de lquidos claros a slidos bajos en fibra y Conservation officer, nature a su dieta normal.  Una dieta de lquidos  claros significa no comer alimentos slidos. Los jugos no deben Financial planner. Durante la dieta de lquidos claros, puedes consumir:  Caldo Jugos claros como el de [place], [place] y [place]. (Evita el jugo de naranja) gelatina paletas heladas  Cuando pueda comer alimentos slidos, elija alimentos bajos en [employer] se recupera. Los alimentos bajos en fibra incluyen:  Frutas enlatadas o cocidas sin semillas ni piel, como pur de [place] y [place]. Verduras enlatadas o bien cocidas sin semillas ni piel Productos lcteos como [place], [place] y Dentist. Huevos Cereal bajo en fibra Carne molida o tierna y bien cocida Pastas Pan blanco y arroz blanco. EVITE LAS CARNES ROJAS MIENTRAS TENGA UN BARDEO ACTIVO.  EVITE AINE, IBUPROFENO, ALEVE, ASPIRINA MIENTRAS TENGA UN BROTE ACTIVO.  EL EJERCICIO Y DEJAR DE FUMAR PUEDEN AYUDAR A PREVENIR LAS RECURRENCIAS.   Despus de que los sntomas mejoren, generalmente dentro de [place], puede volver a Tourist information centre manager de 5 a 15 gramos de fibra por da a su dieta. Reanude su dieta alta en fibra cuando ya no tenga sntomas.   Diverticulosis/ Diverticulitis Information and Diet:   Diverticulosis is a condition in which small, bulging pouches (diverticuli) form inside the lower part of the intestine, usually in the colon. Constipation and straining during bowel movements can worsen the condition. A diet rich in fiber can help keep stools soft and prevent inflammation.  Diverticulitis occurs when the pouches in the colon become infected or inflamed. Dietary changes can help the colon heal.  Fiber is an important part of the diet for patients with diverticulosis. A high-fiber diet softens and gives bulk to the stool,  allowing it to pass quickly and easily.  Diet for Diverticulosis Eat a high-fiber diet when you have diverticulosis. Fiber softens the stool and helps prevent constipation. It also can help decrease pressure in the colon and help prevent flare-ups of  diverticulitis.  High-fiber foods include:  Beans and legumes Bran, whole wheat bread and whole grain cereals such as oatmeal Brown and wild rice Fruits such as apples, bananas and pears Vegetables such as broccoli, carrots, corn and squash Whole wheat pasta If you currently don't have a diet high in fiber, you should add fiber gradually. This helps avoid bloating and abdominal discomfort. The target is to eat 25 to 30 grams of fiber daily. Drink at least 8 cups of fluid daily. Fluid will help soften your stool. Exercise also promotes bowel movement and helps prevent constipation.  When the colon is not inflamed, eat popcorn, nuts and seeds as tolerated.  Diet for Diverticulitis During flare ups of diverticulitis, follow a clear liquid diet. Your doctor will let you know when to progress from clear liquids to low fiber solids and then back to your normal diet.  A clear liquid diet means no solid foods. Juices should have no pulp. During the clear liquid diet, you may consume:  Broth Clear juices such as apple, cranberry and grape. (Avoid orange juice) Jell-O Popsicles  When you're able to eat solid food, choose low fiber foods while healing. Low fiber foods include:  Canned or cooked fruit without seeds or skin, such as applesauce and melon Canned or well cooked vegetables without seeds and skin Dairy products such as cheese, milk and yogurt Eggs Low-fiber cereal Meat that is ground or tender and well cooked Pasta White bread and white rice AVOID RED MEAT WHILE YOU HAVE AN ACTIVE FLARE.  AVOID NSAIDS, IBUPROFEN, ALEVE, ASPIRIN WHILE YOU HAVE AN ACTIVE FLARE.  EXERCISE AS WELL AS SMOKING CESSATION CAN HELP PREVENT RECURRENCES.   After symptoms improve, usually within two to four days, you may add 5 to 15 grams of fiber a day back into your diet. Resume your high fiber diet when you no longer have symptoms.

## 2023-07-26 ENCOUNTER — Other Ambulatory Visit: Payer: Self-pay

## 2023-07-27 ENCOUNTER — Other Ambulatory Visit: Payer: Self-pay

## 2023-07-27 ENCOUNTER — Encounter (HOSPITAL_COMMUNITY): Payer: Self-pay | Admitting: Emergency Medicine

## 2023-07-27 ENCOUNTER — Emergency Department (HOSPITAL_COMMUNITY): Payer: 59

## 2023-07-27 ENCOUNTER — Emergency Department (HOSPITAL_COMMUNITY)
Admission: EM | Admit: 2023-07-27 | Discharge: 2023-07-27 | Disposition: A | Discharge status: Home / Self Care | Payer: 59 | Attending: Emergency Medicine | Admitting: Emergency Medicine

## 2023-07-27 DIAGNOSIS — R918 Other nonspecific abnormal finding of lung field: Secondary | ICD-10-CM | POA: Diagnosis not present

## 2023-07-27 DIAGNOSIS — I10 Essential (primary) hypertension: Secondary | ICD-10-CM | POA: Diagnosis not present

## 2023-07-27 DIAGNOSIS — R5383 Other fatigue: Secondary | ICD-10-CM | POA: Diagnosis not present

## 2023-07-27 DIAGNOSIS — R0989 Other specified symptoms and signs involving the circulatory and respiratory systems: Secondary | ICD-10-CM | POA: Diagnosis not present

## 2023-07-27 LAB — BASIC METABOLIC PANEL
Anion gap: 11 (ref 5–15)
BUN: 8 mg/dL (ref 8–23)
CO2: 26 mmol/L (ref 22–32)
Calcium: 9.3 mg/dL (ref 8.9–10.3)
Chloride: 99 mmol/L (ref 98–111)
Creatinine, Ser: 0.85 mg/dL (ref 0.61–1.24)
GFR, Estimated: >60 mL/min (ref >60)
Glucose, Bld: 137 mg/dL — ABNORMAL HIGH (ref 70–99)
Potassium: 4 mmol/L (ref 3.5–5.1)
Sodium: 136 mmol/L (ref 135–145)

## 2023-07-27 LAB — CBC WITH DIFFERENTIAL/PLATELET
Abs Immature Granulocytes: 0.26 10*3/uL — ABNORMAL HIGH (ref 0.00–0.07)
Basophils Absolute: 0.1 10*3/uL (ref 0.0–0.1)
Basophils Relative: 1 %
Eosinophils Absolute: 0.3 10*3/uL (ref 0.0–0.5)
Eosinophils Relative: 3 %
HCT: 41.4 % (ref 39.0–52.0)
Hemoglobin: 13.8 g/dL (ref 13.0–17.0)
Immature Granulocytes: 2 %
Lymphocytes Relative: 16 %
Lymphs Abs: 2.1 10*3/uL (ref 0.7–4.0)
MCH: 27.5 pg (ref 26.0–34.0)
MCHC: 33.3 g/dL (ref 30.0–36.0)
MCV: 82.6 fL (ref 80.0–100.0)
Monocytes Absolute: 1 10*3/uL (ref 0.1–1.0)
Monocytes Relative: 8 %
Neutro Abs: 9.4 10*3/uL — ABNORMAL HIGH (ref 1.7–7.7)
Neutrophils Relative %: 70 %
Platelets: 396 10*3/uL (ref 150–400)
RBC: 5.01 MIL/uL (ref 4.22–5.81)
RDW: 12.8 % (ref 11.5–15.5)
WBC: 13.1 10*3/uL — ABNORMAL HIGH (ref 4.0–10.5)
nRBC: 0 % (ref 0.0–0.2)

## 2023-07-27 LAB — TROPONIN I (HIGH SENSITIVITY)
Troponin I (High Sensitivity): <2 ng/L (ref <18)
Troponin I (High Sensitivity): 2 ng/L (ref <18)

## 2023-07-27 NOTE — ED Provider Notes (Signed)
[place] EMERGENCY DEPARTMENT AT [facility] Provider Note   CSN: [id number] Arrival date & time: 07/27/23  1035     History  Chief Complaint  Patient presents with   Hypertension    Luis Gomez is a 67 y.o. male, history of hyperlipidemia, GERD, who presents to the ED secondary to elevated blood pressure, 180/120 today.  He states that he felt a little woozy, and just like he was drunk this morning, when his blood pressure was so high, so he took his blood pressure.  Denies any weakness in one-sided the body, difficulty swallowing, shortness of breath, or chest pain.  States he feels fine now, daughter at bedside states he has been more anxious lately, since he has a new diet for his diverticulitis.  He denies any abdominal pain at this time.  Is not on any blood pressure medication, and became alarmed because it was still high.  Denies any current symptoms, feels fine now     Home Medications Prior to Admission medications   Medication Sig Start Date End Date Taking? Authorizing Provider  amoxicillin-clavulanate (AUGMENTIN) 875-125 MG tablet Take 1 tablet by mouth every 12 (twelve) hours. 07/24/23   Catarina Hartshorn, MD  [name] 150-150-200-10 MG TABS tablet TAKE 1 TABLET BY MOUTH DAILY 01/10/23 01/10/24  Gardiner Barefoot, MD  [name] 150-150-200-10 MG TABS tablet TAKE 1 TABLET BY MOUTH DAILY 07/24/23 07/23/24  Catarina Hartshorn, MD  methylcellulose oral powder Take 1 packet by mouth daily.    [provider]  oxyCODONE (OXY IR/ROXICODONE) 5 MG immediate release tablet Take 1 tablet (5 mg total) by mouth every 4 (four) hours as needed for moderate pain. 07/24/23   Catarina Hartshorn, MD  pantoprazole (PROTONIX) 40 MG tablet Take 1 tablet (40 mg total) by mouth daily before breakfast. 03/26/23   Gelene Mink, NP      Allergies    Patient has no known allergies.    Review of Systems   Review of Systems  Neurological:  Positive for weakness and light-headedness. Negative for  dizziness, facial asymmetry, numbness and headaches.    Physical Exam Updated Vital Signs BP (!) 147/95   Pulse 99   Temp 98 F (36.7 C) (Oral)   Resp 18   Ht 5\' 6"  (1.676 m)   Wt 106.6 kg   SpO2 100%   BMI 37.93 kg/m  Physical Exam Vitals and nursing note reviewed.  Constitutional:      General: He is not in acute distress.    Appearance: He is well-developed.  HENT:     Head: Normocephalic and atraumatic.  Eyes:     Conjunctiva/sclera: Conjunctivae normal.  Cardiovascular:     Rate and Rhythm: Normal rate and regular rhythm.     Heart sounds: No murmur heard. Pulmonary:     Effort: Pulmonary effort is normal. No respiratory distress.     Breath sounds: Normal breath sounds.  Abdominal:     Palpations: Abdomen is soft.     Tenderness: There is no abdominal tenderness.  Musculoskeletal:        General: No swelling.     Cervical back: Neck supple.  Skin:    General: Skin is warm and dry.     Capillary Refill: Capillary refill takes less than 2 seconds.  Neurological:     General: No focal deficit present.     Mental Status: He is alert and oriented to person, place, and time.     Cranial Nerves: No cranial  nerve deficit.     Sensory: No sensory deficit.     Motor: No weakness.     Coordination: Coordination normal.  Psychiatric:        Mood and Affect: Mood normal.    ED Results / Procedures / Treatments   Labs (all labs ordered are listed, but only abnormal results are displayed) Labs Reviewed  CBC WITH DIFFERENTIAL/PLATELET - Abnormal; Notable for the following components:      Result Value   WBC 13.1 (*)    Neutro Abs 9.4 (*)    Abs Immature Granulocytes 0.26 (*)    All other components within normal limits  BASIC METABOLIC PANEL - Abnormal; Notable for the following components:   Glucose, Bld 137 (*)    All other components within normal limits  TROPONIN I (HIGH SENSITIVITY)  TROPONIN I (HIGH SENSITIVITY)    EKG None  Radiology DG Chest 2  View  Result Date: 07/27/2023 CLINICAL DATA:  Hypertension and fatigue EXAM: CHEST - 2 VIEW COMPARISON:  Chest radiograph dated 03/31/2020 FINDINGS: Low lung volumes. Bibasilar patchy opacities. No pleural effusion or pneumothorax. The heart size and mediastinal contours are within normal limits. No acute osseous abnormality. IMPRESSION: Low lung volumes with bibasilar patchy opacities, which may represent atelectasis or infection. Electronically Signed   By: Agustin Cree M.D.   On: 07/27/2023 16:10    Procedures Procedures   Medications Ordered in ED Medications - No data to display  ED Course/ Medical Decision Making/ A&P                                 Medical Decision Making Patient is a 67 year old male, here for an episode where he got a little woozy and felt like he was drunk this morning, blood pressure was elevated.  Is not currently on any blood pressure medicine.  Daughter at bedside says he has been more stressed out because he was recently discharge for diverticulitis, and he has been making a lot of eating changes.  He has no neurodeficits on my exam, did not hit his head, overall well-appearing.  We will obtain troponin, chest x-ray, blood work for further evaluation of any kind of end organ damage, secondary to hypertension.  Amount and/or Complexity of Data Reviewed Labs: ordered.    Details: Troponin flat, no electrolyte abnormalities, mild leukocytosis Radiology: ordered.    Details: Chest x-ray possible concern for infiltrates versus atelectasis ECG/medicine tests:  Decision-making details documented in ED Course. Discussion of management or test interpretation with external provider(s): Patient here for wheezy episode after having high blood pressure.  Blood pressure x 4 fairly normal, mildly hypertensive, but not impressively so.  Is not currently symptomatic.  Not currently on any blood pressure medications.  Chest x-ray showed possible infiltrates versus atelectasis.  He  has no infectious symptoms at this time, and has no shortness of breath.  His lungs are clear to auscultation.  He is also on Augmentin for his diverticulitis as this would cover any kind of pneumonia if needed.  We discussed taking his blood pressure at home several times a day, and following up with his PCP for further evaluation.  He voiced understanding discharged home    Final Clinical Impression(s) / ED Diagnoses Final diagnoses:  Hypertension, unspecified type    Rx / DC Orders ED Discharge Orders     None         Ladarion Munyon L,  PA 07/27/23 2133    Eber Hong, MD 07/29/23 [phone]

## 2023-07-27 NOTE — ED Triage Notes (Signed)
Pt presents with HTN, doesn't take BP meds, d/c from this hospital on 07/24/23 for diverticulitis.

## 2023-07-27 NOTE — Discharge Instructions (Signed)
Your blood pressure was a little bit high this morning, please follow-up with your primary care doctor for further evaluation.  It was reassuring today.  Make sure you take your blood pressure 1-2 times a day and make a list for your primary care doctor.  Your x-ray showed possible pneumonia however your breath sounds are clear, continue taking your Augmentin for your diverticulitis, and if you have shortness of breath, chest pain return to the ER immediately

## 2023-07-29 DIAGNOSIS — K219 Gastro-esophageal reflux disease without esophagitis: Secondary | ICD-10-CM | POA: Diagnosis not present

## 2023-07-29 DIAGNOSIS — E1165 Type 2 diabetes mellitus with hyperglycemia: Secondary | ICD-10-CM | POA: Diagnosis not present

## 2023-07-29 DIAGNOSIS — K5732 Diverticulitis of large intestine without perforation or abscess without bleeding: Secondary | ICD-10-CM | POA: Diagnosis not present

## 2023-07-31 ENCOUNTER — Encounter: Payer: Self-pay | Admitting: Gastroenterology

## 2023-07-31 ENCOUNTER — Ambulatory Visit: Payer: 59 | Admitting: Gastroenterology

## 2023-07-31 VITALS — BP 153/92 | HR 94 | Temp 98.2°F | Ht 66.0 in | Wt 199.0 lb

## 2023-07-31 DIAGNOSIS — K219 Gastro-esophageal reflux disease without esophagitis: Secondary | ICD-10-CM | POA: Diagnosis not present

## 2023-07-31 DIAGNOSIS — K5732 Diverticulitis of large intestine without perforation or abscess without bleeding: Secondary | ICD-10-CM | POA: Diagnosis not present

## 2023-07-31 DIAGNOSIS — R195 Other fecal abnormalities: Secondary | ICD-10-CM | POA: Diagnosis not present

## 2023-07-31 NOTE — Patient Instructions (Signed)
Complete antibiotics (Augmentin), take through Sunday then stop. Start adding back solid foods. Increase dietary fiber. Continue fiber supplement.  Your stools should improve over the next week or so off antibiotics and with increasing solid foods.  If you have mushy or liquid stools, please collect stool to test for Cdiff.  Let's have you return for follow up in 4 weeks, we can discuss possibly colonoscopy at that time to evaluate the abnormal part of your colon seen on CT scan.

## 2023-07-31 NOTE — Progress Notes (Unsigned)
GI Office Note    Referring Provider: Assunta Found, MD Primary Care Physician:  Assunta Found, MD  Primary Gastroenterologist:  Chief Complaint   Chief Complaint  Patient presents with   Diarrhea    Not having diarrhea states that it is soft but with a lot of mucous. States that he hasn't had very much stool come out.    History of Present Illness   Luis Gomez is a 67 y.o. male presenting today with concern for abnormal stools.  He walked into the office in about a same-day appointment.  States he just got out of the hospital last week.  Last outpatient May 2023 for chronic GERD, history of colon polyps due for surveillance in 2026.  Patient hospitalized from September 16 through September 18 with sigmoid diverticulitis with a small fluid collection that could be a developing abscess.  He was seen by general surgery who recommended continuing antibiotic therapy.  Patient improved rapidly.  IR was consulted and they felt like this was diverticula and not an abscess.  He was discharged home on Augmentin 875/125 mg twice daily for 8 more days.  He presented back to the ED September 21 with elevated blood pressure of 180/120, symptomatic.  Chest x-ray showed low lung volumes with bibasilar patchy opacities, which could represent atelectasis or infection.  White blood cell count improved from recent admission down from 15,000 213,100.  Presents with his longtime girlfriend who helps with interpretation. Patient speaks some English as well. He is concerned because he continues passing mucus in his stool.  He is having to pass mucus and/or 1 to 2 inches long soft pieces of stool every 30 to 60 minutes throughout the day.  No blood in the stool or melena.  Abdominal pain completely resolved.  His appetite is good.  Denies taking any laxative or stool softeners.  He does continue taking fiber supplement when she has to this entire time.  Also eating cooked vegetables, soups, noodles,  oatmeal.  Heartburn well-controlled.  He was on Augmentin prior to being in the hospital, this was provided to him for upper respiratory type symptoms.  When he was discharged from the hospital on the 18th, he was provided an 8-day course of Augmentin, insurance would not allow fill of this antibiotic until he completed his previous course of Augmentin.  He just picked up the new bottle yesterday.  Advised him to complete antibiotics through Sunday and that he could stop medication.    CT abdomen pelvis with contrast September 16: IMPRESSION: Changes consistent with acute sigmoid diverticulitis with a small extraluminal fluid collection which may represent a developing abscess. It measures 1 cm in greatest dimension lying between the sigmoid and the bladder as described.    EGD May 2021:  -Severe erosive/ulcerative reflux esophagitis extending up to 8 cm from the GE junction. -Noncritical Schatzki ring not manipulated. -Medium-sized hiatal hernia.   Colonoscopy November 2021: -Diverticulosis in the sigmoid colon and in the descending colon. -Two 3 to 8 mm polyps in the ascending colon and in the cecum, removed with a cold snare. Resected and retrieved.  Pathology revealed sessile serrated polyp, tubular adenoma. -The examination was otherwise normal on direct and retroflexion views. I suspect prior abdominal pain and abnormal CT findings secondary to diverticulitis which is resolved. -Next colonoscopy in 5 years.      Medications   Current Outpatient Medications  Medication Sig Dispense Refill   amoxicillin-clavulanate (AUGMENTIN) 875-125 MG tablet Take 1 tablet  by mouth every 12 (twelve) hours. 16 tablet 0   ezetimibe (ZETIA) 10 MG tablet Take 10 mg by mouth daily.     [name] 150-150-200-10 MG TABS tablet TAKE 1 TABLET BY MOUTH DAILY     methylcellulose oral powder Take 1 packet by mouth daily.     oxyCODONE (OXY IR/ROXICODONE) 5 MG immediate release tablet Take 1 tablet (5 mg  total) by mouth every 4 (four) hours as needed for moderate pain. 12 tablet 0   pantoprazole (PROTONIX) 40 MG tablet Take 1 tablet (40 mg total) by mouth daily before breakfast. 90 tablet 3   No current facility-administered medications for this visit.    Allergies   Allergies as of 07/31/2023   (No Known Allergies)      Review of Systems   General: Negative for anorexia, weight loss, fever, chills, fatigue, weakness. ENT: Negative for hoarseness, difficulty swallowing , nasal congestion. CV: Negative for chest pain, angina, palpitations, dyspnea on exertion, peripheral edema.  Respiratory: Negative for dyspnea at rest, dyspnea on exertion, cough, sputum, wheezing.  GI: See history of present illness. GU:  Negative for dysuria, hematuria, urinary incontinence, urinary frequency, nocturnal urination.  Endo: Negative for unusual weight change.     Physical Exam   BP (!) 153/92 (BP Location: Right Arm, Patient Position: Sitting, Cuff Size: Normal)   Pulse 94   Temp 98.2 F (36.8 C) (Oral)   Ht 5\' 6"  (1.676 m)   Wt 199 lb (90.3 kg)   SpO2 95%   BMI 32.12 kg/m    General: Well-nourished, well-developed in no acute distress.  Eyes: No icterus. Mouth: Oropharyngeal mucosa moist and pink   Abdomen: Bowel sounds are normal, nontender, nondistended, no hepatosplenomegaly or masses,  no abdominal bruits or hernia , no rebound or guarding.  Rectal: Not performed Extremities: No lower extremity edema. No clubbing or deformities. Neuro: Alert and oriented x 4   Skin: Warm and dry, no jaundice.   Psych: Alert and cooperative, normal mood and affect.  Labs   Lab Results  Component Value Date   NA 136 07/27/2023   CL 99 07/27/2023   K 4.0 07/27/2023   CO2 26 07/27/2023   BUN 8 07/27/2023   CREATININE 0.85 07/27/2023   GFRNONAA >60 07/27/2023   CALCIUM 9.3 07/27/2023   ALBUMIN 3.1 (L) 07/24/2023   GLUCOSE 137 (H) 07/27/2023   Lab Results  Component Value Date   ALT 16  07/24/2023   AST 12 (L) 07/24/2023   ALKPHOS 57 07/24/2023   BILITOT 0.9 07/24/2023   Lab Results  Component Value Date   WBC 13.1 (H) 07/27/2023   HGB 13.8 07/27/2023   HCT 41.4 07/27/2023   MCV 82.6 07/27/2023   PLT 396 07/27/2023   Lab Results  Component Value Date   LIPASE 24 07/22/2020    Imaging Studies   DG Chest 2 View  Result Date: 07/27/2023 CLINICAL DATA:  Hypertension and fatigue EXAM: CHEST - 2 VIEW COMPARISON:  Chest radiograph dated 03/31/2020 FINDINGS: Low lung volumes. Bibasilar patchy opacities. No pleural effusion or pneumothorax. The heart size and mediastinal contours are within normal limits. No acute osseous abnormality. IMPRESSION: Low lung volumes with bibasilar patchy opacities, which may represent atelectasis or infection. Electronically Signed   By: Agustin Cree M.D.   On: 07/27/2023 16:10   CT ABDOMEN PELVIS W CONTRAST  Result Date: 07/22/2023 CLINICAL DATA:  Acute left inguinal pain for 2 days, initial encounter EXAM: CT ABDOMEN AND PELVIS WITH CONTRAST  TECHNIQUE: Multidetector CT imaging of the abdomen and pelvis was performed using the standard protocol following bolus administration of intravenous contrast. RADIATION DOSE REDUCTION: This exam was performed according to the departmental dose-optimization program which includes automated exposure control, adjustment of the mA and/or kV according to patient size and/or use of iterative reconstruction technique. CONTRAST:  OMNIPAQUE IOHEXOL 300 MG/ML  SOLN COMPARISON:  04/01/2020 FINDINGS: Lower chest: Calcified granuloma is noted in the right lung base stable from the prior exam. Hepatobiliary: No focal liver abnormality is seen. No gallstones, gallbladder wall thickening, or biliary dilatation. Pancreas: Unremarkable. No pancreatic ductal dilatation or surrounding inflammatory changes. Spleen: Normal in size without focal abnormality. Adrenals/Urinary Tract: Adrenal glands are within normal limits. Kidneys  demonstrate a normal enhancement pattern bilaterally. No renal calculi or obstructive changes are seen. The ureters are within normal limits. The bladder is partially distended. Stomach/Bowel: Diverticular change of the colon is noted with evidence of diverticulitis at the rectosigmoid junction. A small 1 cm fluid collection is noted just beneath the sigmoid in the fatty space between the colon and the bladder. This is best visualized on coronal image number 78. Considerable inflammatory changes are noted. No sizable abscess is identified. The more proximal colon is within normal limits. The appendix is unremarkable. Small bowel and stomach are within normal limits. Vascular/Lymphatic: No significant vascular findings are present. No enlarged abdominal or pelvic lymph nodes. Reproductive: Prostate is unremarkable. Other: No abdominal wall hernia or abnormality. No abdominopelvic ascites. Musculoskeletal: No acute or significant osseous findings. IMPRESSION: Changes consistent with acute sigmoid diverticulitis with a small extraluminal fluid collection which may represent a developing abscess. It measures 1 cm in greatest dimension lying between the sigmoid and the bladder as described. No other focal abnormality is noted. Electronically Signed   By: Alcide Clever M.D.   On: 07/22/2023 02:53    Assessment   *Sigmoid diverticulitis *Abnormal sigmoid colon on CT *GERD  Ov four weeks, then tcs  PLAN   ***   Leanna Battles. Melvyn Neth, MHS, PA-C [facility]

## 2023-08-09 DIAGNOSIS — J069 Acute upper respiratory infection, unspecified: Secondary | ICD-10-CM | POA: Diagnosis not present

## 2023-08-21 ENCOUNTER — Other Ambulatory Visit (HOSPITAL_COMMUNITY): Payer: Self-pay | Admitting: Family Medicine

## 2023-08-21 DIAGNOSIS — J209 Acute bronchitis, unspecified: Secondary | ICD-10-CM

## 2023-08-21 DIAGNOSIS — J069 Acute upper respiratory infection, unspecified: Secondary | ICD-10-CM | POA: Diagnosis not present

## 2023-08-27 ENCOUNTER — Encounter: Payer: Self-pay | Admitting: General Surgery

## 2023-08-27 ENCOUNTER — Ambulatory Visit (INDEPENDENT_AMBULATORY_CARE_PROVIDER_SITE_OTHER): Payer: 59 | Admitting: General Surgery

## 2023-08-27 VITALS — BP 143/87 | HR 89 | Temp 97.5°F | Resp 14 | Ht 66.0 in | Wt 189.0 lb

## 2023-08-27 DIAGNOSIS — K5732 Diverticulitis of large intestine without perforation or abscess without bleeding: Secondary | ICD-10-CM | POA: Diagnosis not present

## 2023-08-27 NOTE — Patient Instructions (Addendum)
Haga un seguimiento con el GI con respecto a futuras colonoscopias.   Diverticulosis/Diverticulitis Informacin y Dieta:   La diverticulosis es una afeccin en la que se forman pequeas bolsas abultadas (divertculos) dentro de la parte inferior del intestino, generalmente en el colon. El estreimiento y el esfuerzo al defecar pueden empeorar la afeccin. Una dieta rica en fibra puede ayudar a mantener las heces blandas y prevenir la inflamacin.  La diverticulitis ocurre cuando las bolsas del colon se infectan o inflaman. Los [employer] en la dieta pueden ayudar a Education officer, community colon.  La fibra es una parte importante de la dieta de los pacientes con diverticulosis. Una dieta rica en fibra suaviza y da volumen a las heces, permitindoles pasar rpida y fcilmente.  Dieta para la diverticulosis Consuma una dieta rica en fibra cuando tenga diverticulosis. La fibra Intel heces y [place] a Banker. Tambin puede ayudar a disminuir la presin en el colon y ayudar a prevenir brotes de diverticulitis.  Los alimentos ricos en fibra incluyen:  Frijoles y legumbres Salvado, pan integral y cereales integrales como la avena. Arroz integral y salvaje [employer], pltanos y peras. Verduras como brcoli, zanahorias, maz y calabaza. pasta integral Si actualmente no tienes Pension scheme manager, debes agregar fibra gradualmente. Esto ayuda a Transport planner hinchazn y las molestias abdominales. El objetivo es consumir de 25 a 30 gramos de Tax adviser. Beba al menos 8 tazas de lquido al [employer]. El lquido ayudar a [employer]. El ejercicio tambin promueve la evacuacin intestinal y [place] a Banker.  Cuando el colon no est inflamado, coma palomitas de maz, nueces y semillas segn lo tolere.  Dieta para la diverticulitis Durante los brotes de diverticulitis, siga una dieta de lquidos claros. Su mdico le informar cundo pasar de lquidos claros a slidos  bajos en fibra y Conservation officer, nature a su dieta normal.  Una dieta de lquidos claros significa no comer alimentos slidos. Los jugos no deben Financial planner. Durante la dieta de lquidos claros, puedes consumir:  Caldo Jugos claros como el de [place], [place] y [place]. (Evita el jugo de naranja) gelatina paletas heladas  Cuando pueda comer alimentos slidos, elija alimentos bajos en [employer] se recupera. Los alimentos bajos en fibra incluyen:  Frutas enlatadas o cocidas sin semillas ni piel, como pur de [place] y [place]. Verduras enlatadas o bien cocidas sin semillas ni piel Productos lcteos como [place], [place] y Dentist. Huevos Cereal bajo en fibra Carne molida o tierna y bien cocida Pastas Pan blanco y arroz blanco. EVITE LAS CARNES ROJAS MIENTRAS TENGA UN BARDEO ACTIVO.  EVITE AINE, IBUPROFENO, ALEVE, ASPIRINA MIENTRAS TENGA UN BROTE ACTIVO.  EL EJERCICIO Y DEJAR DE FUMAR PUEDEN AYUDAR A PREVENIR LAS RECURRENCIAS.   Despus de que los sntomas mejoren, generalmente dentro de [place], puede volver a Tourist information centre manager de 5 a 15 gramos de fibra por da a su dieta. Reanude su dieta alta en fibra cuando ya no tenga sntomas.   Follow up with GI regarding future colonoscopy.   Diverticulosis/ Diverticulitis Information and Diet:   Diverticulosis is a condition in which small, bulging pouches (diverticuli) form inside the lower part of the intestine, usually in the colon. Constipation and straining during bowel movements can worsen the condition. A diet rich in fiber can help keep stools soft and prevent inflammation.  Diverticulitis occurs when the pouches in the colon become infected or inflamed. Dietary changes can help the colon heal.  Fiber is an important part of the diet for patients with diverticulosis. A high-fiber diet softens and gives bulk to the stool, allowing it to pass quickly and easily.  Diet for Diverticulosis Eat a high-fiber diet when you have diverticulosis. Fiber  softens the stool and helps prevent constipation. It also can help decrease pressure in the colon and help prevent flare-ups of diverticulitis.  High-fiber foods include:  Beans and legumes Bran, whole wheat bread and whole grain cereals such as oatmeal Brown and wild rice Fruits such as apples, bananas and pears Vegetables such as broccoli, carrots, corn and squash Whole wheat pasta If you currently don't have a diet high in fiber, you should add fiber gradually. This helps avoid bloating and abdominal discomfort. The target is to eat 25 to 30 grams of fiber daily. Drink at least 8 cups of fluid daily. Fluid will help soften your stool. Exercise also promotes bowel movement and helps prevent constipation.  When the colon is not inflamed, eat popcorn, nuts and seeds as tolerated.  Diet for Diverticulitis During flare ups of diverticulitis, follow a clear liquid diet. Your doctor will let you know when to progress from clear liquids to low fiber solids and then back to your normal diet.  A clear liquid diet means no solid foods. Juices should have no pulp. During the clear liquid diet, you may consume:  Broth Clear juices such as apple, cranberry and grape. (Avoid orange juice) Jell-O Popsicles  When you're able to eat solid food, choose low fiber foods while healing. Low fiber foods include:  Canned or cooked fruit without seeds or skin, such as applesauce and melon Canned or well cooked vegetables without seeds and skin Dairy products such as cheese, milk and yogurt Eggs Low-fiber cereal Meat that is ground or tender and well cooked Pasta White bread and white rice AVOID RED MEAT WHILE YOU HAVE AN ACTIVE FLARE.  AVOID NSAIDS, IBUPROFEN, ALEVE, ASPIRIN WHILE YOU HAVE AN ACTIVE FLARE.  EXERCISE AS WELL AS SMOKING CESSATION CAN HELP PREVENT RECURRENCES.   After symptoms improve, usually within two to four days, you may add 5 to 15 grams of fiber a day back into your diet. Resume  your high fiber diet when you no longer have symptoms.

## 2023-08-27 NOTE — Progress Notes (Signed)
[facility] Note   HPI:  67 y.o. Male presents to clinic for follow-up evaluation of for his diverticulitis with possible small abscess versus diverticula. Patient reports doing well and having no pain or issues with diet. He is here today with an interpretor.   Review of Systems:  No pain Regular Bms  All other review of systems: otherwise negative   Vital Signs:  BP (!) 143/87   Pulse 89   Temp (!) 97.5 F (36.4 C) (Oral)   Resp 14   Ht 5\' 6"  (1.676 m)   Wt 189 lb (85.7 kg)   SpO2 94%   BMI 30.51 kg/m    Physical Exam:  Physical Exam Vitals reviewed.  HENT:     Head: Normocephalic.  Cardiovascular:     Rate and Rhythm: Normal rate.  Pulmonary:     Effort: Pulmonary effort is normal.  Abdominal:     General: There is no distension.     Palpations: Abdomen is soft.     Tenderness: There is no abdominal tenderness.  Musculoskeletal:        General: Normal range of motion.  Skin:    General: Skin is warm.  Neurological:     General: No focal deficit present.     Mental Status: He is alert.     Assessment:  67 y.o. yo Male with recent admission for diverticulitis. He is doing well. He has followed up with GI.  Plan:  Follow up with GI regarding future colonoscopy.   Diverticulosis/ Diverticulitis Information and Diet:   Diverticulosis is a condition in which small, bulging pouches (diverticuli) form inside the lower part of the intestine, usually in the colon. Constipation and straining during bowel movements can worsen the condition. A diet rich in fiber can help keep stools soft and prevent inflammation.  Diverticulitis occurs when the pouches in the colon become infected or inflamed. Dietary changes can help the colon heal.  Fiber is an important part of the diet for patients with diverticulosis. A high-fiber diet softens and gives bulk to the stool, allowing it to pass quickly and easily.  Diet for Diverticulosis Eat a high-fiber diet  when you have diverticulosis. Fiber softens the stool and helps prevent constipation. It also can help decrease pressure in the colon and help prevent flare-ups of diverticulitis.  High-fiber foods include:  Beans and legumes Bran, whole wheat bread and whole grain cereals such as oatmeal Brown and wild rice Fruits such as apples, bananas and pears Vegetables such as broccoli, carrots, corn and squash Whole wheat pasta If you currently don't have a diet high in fiber, you should add fiber gradually. This helps avoid bloating and abdominal discomfort. The target is to eat 25 to 30 grams of fiber daily. Drink at least 8 cups of fluid daily. Fluid will help soften your stool. Exercise also promotes bowel movement and helps prevent constipation.  When the colon is not inflamed, eat popcorn, nuts and seeds as tolerated.  Diet for Diverticulitis During flare ups of diverticulitis, follow a clear liquid diet. Your doctor will let you know when to progress from clear liquids to low fiber solids and then back to your normal diet.  A clear liquid diet means no solid foods. Juices should have no pulp. During the clear liquid diet, you may consume:  Broth Clear juices such as apple, cranberry and grape. (Avoid orange juice) Jell-O Popsicles  When you're able to eat solid food, choose low fiber foods while healing. Low  fiber foods include:  Canned or cooked fruit without seeds or skin, such as applesauce and melon Canned or well cooked vegetables without seeds and skin Dairy products such as cheese, milk and yogurt Eggs Low-fiber cereal Meat that is ground or tender and well cooked Pasta White bread and white rice AVOID RED MEAT WHILE YOU HAVE AN ACTIVE FLARE.  AVOID NSAIDS, IBUPROFEN, ALEVE, ASPIRIN WHILE YOU HAVE AN ACTIVE FLARE.  EXERCISE AS WELL AS SMOKING CESSATION CAN HELP PREVENT RECURRENCES.   After symptoms improve, usually within two to four days, you may add 5 to 15 grams of fiber  a day back into your diet. Resume your high fiber diet when you no longer have symptoms.  PRN Follow up   Algis Greenhouse, MD [facility] [address] [place], [place] [address] [phone] (office)

## 2023-08-28 ENCOUNTER — Ambulatory Visit (INDEPENDENT_AMBULATORY_CARE_PROVIDER_SITE_OTHER): Payer: 59 | Admitting: Gastroenterology

## 2023-08-28 ENCOUNTER — Encounter: Payer: Self-pay | Admitting: *Deleted

## 2023-08-28 ENCOUNTER — Other Ambulatory Visit: Payer: Self-pay | Admitting: *Deleted

## 2023-08-28 ENCOUNTER — Telehealth: Payer: Self-pay | Admitting: *Deleted

## 2023-08-28 ENCOUNTER — Encounter: Payer: Self-pay | Admitting: Gastroenterology

## 2023-08-28 VITALS — BP 148/90 | HR 82 | Temp 98.0°F | Ht 66.0 in | Wt 190.6 lb

## 2023-08-28 DIAGNOSIS — Z860101 Personal history of adenomatous and serrated colon polyps: Secondary | ICD-10-CM | POA: Diagnosis not present

## 2023-08-28 DIAGNOSIS — K5720 Diverticulitis of large intestine with perforation and abscess without bleeding: Secondary | ICD-10-CM

## 2023-08-28 DIAGNOSIS — R933 Abnormal findings on diagnostic imaging of other parts of digestive tract: Secondary | ICD-10-CM | POA: Diagnosis not present

## 2023-08-28 DIAGNOSIS — K5792 Diverticulitis of intestine, part unspecified, without perforation or abscess without bleeding: Secondary | ICD-10-CM

## 2023-08-28 MED ORDER — PEG 3350-KCL-NA BICARB-NACL 420 G PO SOLR: 4000.0000 mL | Freq: Once | ORAL | 0 refills | Status: AC

## 2023-08-28 NOTE — Patient Instructions (Addendum)
Colonoscopy to be scheduled. See separate instructions to be provided.  Call if you have any recurrent abdominal pain.   Colonoscopia por programar. Consulte las instrucciones separadas que se proporcionarn.  Llame si tiene algn dolor abdominal recurrente.

## 2023-08-28 NOTE — Progress Notes (Signed)
GI Office Note    Referring Provider: Assunta Found, MD Primary Care Physician:  Assunta Found, MD  Primary Gastroenterologist: Roetta Sessions, MD   Chief Complaint   Chief Complaint  Patient presents with   Follow-up    Doing well, no issues    History of Present Illness   Luis Gomez is a 67 y.o. male presenting today for follow-up.  Last seen as a walk-in appointment on July 31, 2023 with concern for abnormal stools.  Presented without formal Spanish interpreter.  Longtime significant other provided interpretation.  Patient hospitalized back in September with sigmoid diverticulitis with small fluid collection that could be developing abscess.  He was seen by general surgery who recommended antibiotics.  IR was consulted and they felt like this was a diverticula and not an abscess.  When patient was seen he was concerned that he was passing mucus in the stool and passing only small amounts of soft stool multiple times daily.  No diarrhea.  Recommended completing antibiotics, adding back some soft foods and eventually adding high-fiber diet.  Followed up with general surgery yesterday with no complaints, symptoms resolved.Colonoscopy was recommended.  Today: doing well. BMs 1-2 times per day. No melena, brbpr. Abdominal pain resolved. Appetite normal. No n/v. No heartburn. No dysphagia.   CT abdomen pelvis with contrast September 16: IMPRESSION: Changes consistent with acute sigmoid diverticulitis with a small extraluminal fluid collection which may represent a developing abscess. It measures 1 cm in greatest dimension lying between the sigmoid and the bladder as described.   EGD May 2021:  -Severe erosive/ulcerative reflux esophagitis extending up to 8 cm from the GE junction. -Noncritical Schatzki ring not manipulated. -Medium-sized hiatal hernia.   Colonoscopy November 2021: -Diverticulosis in the sigmoid colon and in the descending colon. -Two 3 to 8  mm polyps in the ascending colon and in the cecum, removed with a cold snare. Resected and retrieved.  Pathology revealed sessile serrated polyp, tubular adenoma. -The examination was otherwise normal on direct and retroflexion views. I suspect prior abdominal pain and abnormal CT findings secondary to diverticulitis which is resolved. -Next colonoscopy in 5 years.   Medications   Current Outpatient Medications  Medication Sig Dispense Refill   benzonatate (TESSALON) 200 MG capsule Take 200 mg by mouth 3 (three) times daily.     ezetimibe (ZETIA) 10 MG tablet Take 10 mg by mouth daily.     [name] 150-150-200-10 MG TABS tablet TAKE 1 TABLET BY MOUTH DAILY     methylcellulose oral powder Take 1 packet by mouth daily.     pantoprazole (PROTONIX) 40 MG tablet Take 1 tablet (40 mg total) by mouth daily before breakfast. 90 tablet 3   predniSONE (DELTASONE) 10 MG tablet Take by mouth.     valACYclovir (VALTREX) 500 MG tablet SMARTSIG:2 Tablet(s) By Mouth Every 12 Hours     No current facility-administered medications for this visit.    Allergies   Allergies as of 08/28/2023   (No Known Allergies)     Past Medical History   Past Medical History:  Diagnosis Date   Diverticulosis    Genital HSV    GERD (gastroesophageal reflux disease)    Last EGD->07/02/06 normal   HIV disease (HCC)    S/P colonoscopy 07/02/2006   Left sided diverticula    Past Surgical History   Past Surgical History:  Procedure Laterality Date   BACK SURGERY     COLONOSCOPY  07/03/2006   Normal rectum, left-sided  diverticulum   COLONOSCOPY N/A 02/07/2018   Rourk: Sessile serrated polyp removed from the colon, tubular adenoma.  Next colonoscopy in 5 years.   COLONOSCOPY N/A 09/14/2020   Diverticulosis, 2 polyps removed (sessile serrated polyp, tubular adenoma), next colonoscopy in 5 years.   ESOPHAGOGASTRODUODENOSCOPY  07/03/2006   Normal esophagus, stomach, D1, D2.   ESOPHAGOGASTRODUODENOSCOPY N/A  04/01/2020   Rourk: Severe erosive/ulcerative reflux esophagitis extending up 8 cm from the GE junction.  Noncritical Schatzki ring not manipulated.  Medium sized hiatal hernia.   POLYPECTOMY  02/07/2018   Procedure: POLYPECTOMY;  Surgeon: Corbin Ade, MD;  [facility];  Service: Endoscopy;;  colon    POLYPECTOMY  09/14/2020   Procedure: POLYPECTOMY;  Surgeon: Corbin Ade, MD;  [facility]: [facility];  Service: Endoscopy;;    Past Family History   Family History  Problem Relation Age of Onset   Colon cancer Neg Hx        parents passed over 40 years ago, UNKNOWN if colon cancer    Past Social History   Social History   Socioeconomic History   Marital status: Divorced    Spouse name: Not on file   Number of children: 4   Years of education: Not on file   Highest education level: Not on file  Occupational History   Occupation: Equity    Employer: [employer]  Tobacco Use   Smoking status: Never   Smokeless tobacco: Never  Vaping Use   Vaping status: Never Used  Substance and Sexual Activity   Alcohol use: No   Drug use: No   Sexual activity: Not Currently    Comment: pt. given condoms  Other Topics Concern   Not on file  Social History Narrative   Not on file   Social Determinants of Health   Financial Resource Strain: Not on file  Food Insecurity: No Food Insecurity (07/22/2023)   Hunger Vital Sign    Worried About Running Out of Food in the Last Year: Never true    Ran Out of Food in the Last Year: Never true  Transportation Needs: No Transportation Needs (07/22/2023)   PRAPARE - Administrator, Civil Service (Medical): No    Lack of Transportation (Non-Medical): No  Physical Activity: Not on file  Stress: Not on file  Social Connections: Not on file  Intimate Partner Violence: Not At Risk (07/22/2023)   Humiliation, Afraid, Rape, and Kick questionnaire    Fear of Current or Ex-Partner: No    Emotionally Abused: No     Physically Abused: No    Sexually Abused: No    Review of Systems   General: Negative for anorexia, weight loss, fever, chills, fatigue, weakness. ENT: Negative for hoarseness, difficulty swallowing , nasal congestion. CV: Negative for chest pain, angina, palpitations, dyspnea on exertion, peripheral edema.  Respiratory: Negative for dyspnea at rest, dyspnea on exertion, cough, sputum, wheezing.  GI: See history of present illness. GU:  Negative for dysuria, hematuria, urinary incontinence, urinary frequency, nocturnal urination.  Endo: Negative for unusual weight change.     Physical Exam   BP (!) 148/90 (BP Location: Right Arm, Patient Position: Sitting, Cuff Size: Large)   Pulse 82   Temp 98 F (36.7 C) (Oral)   Ht 5\' 6"  (1.676 m)   Wt 190 lb 9.6 oz (86.5 kg)   SpO2 96%   BMI 30.76 kg/m    General: Well-nourished, well-developed in no acute distress.  Eyes: No icterus.  Mouth: Oropharyngeal mucosa moist and pink   Lungs: Clear to auscultation bilaterally.  Heart: Regular rate and rhythm, no murmurs rubs or gallops.  Abdomen: Bowel sounds are normal, nontender, nondistended, no hepatosplenomegaly or masses,  no abdominal bruits or hernia , no rebound or guarding.  Rectal: not performed  Extremities: No lower extremity edema. No clubbing or deformities. Neuro: Alert and oriented x 4   Skin: Warm and dry, no jaundice.   Psych: Alert and cooperative, normal mood and affect.  Labs   Lab Results  Component Value Date   NA 136 07/27/2023   CL 99 07/27/2023   K 4.0 07/27/2023   CO2 26 07/27/2023   BUN 8 07/27/2023   CREATININE 0.85 07/27/2023   GFRNONAA >60 07/27/2023   CALCIUM 9.3 07/27/2023   ALBUMIN 3.1 (L) 07/24/2023   GLUCOSE 137 (H) 07/27/2023   Lab Results  Component Value Date   ALT 16 07/24/2023   AST 12 (L) 07/24/2023   ALKPHOS 57 07/24/2023   BILITOT 0.9 07/24/2023   Lab Results  Component Value Date   WBC 13.1 (H) 07/27/2023   HGB 13.8 07/27/2023    HCT 41.4 07/27/2023   MCV 82.6 07/27/2023   PLT 396 07/27/2023    Imaging Studies   No results found.  Assessment/Plan:   Diverticulitis/abnormal colon on CT/history of adenomatous colon polyps: -abdominal pain resolved and bowel function as returned to normal -recommend updating colonoscopy given abnormal colon on CT and last colonoscopy nearly 3 years ago.  -patient to add back high fiber diet at this time -patient education for high fiber diet/diverticulosis management discussed and provided in [place] and Spanish today -colonoscopy in the near future. ASA 2.  I have discussed the risks, alternatives, benefits with regards to but not limited to the risk of reaction to medication, bleeding, infection, perforation and the patient is agreeable to proceed. Written consent to be obtained. -patient to call if any recurrent GI symptoms, abdominal pain.    Leanna Battles. Melvyn Neth, MHS, PA-C [facility]

## 2023-08-28 NOTE — Telephone Encounter (Signed)
[employer] PA:  Notification or Prior Authorization is not required for the requested services You are not required to submit a notification/prior authorization based on the information provided. If you have general questions about the prior authorization requirements, visit [email or web address] > Clinician Resources > Advance and Admission Notification Requirements. The number above acknowledges your notification. Please write this reference number down for future reference. If you would like to request an organization determination, please call us at [phone]. Decision ID #: [id number]

## 2023-08-28 NOTE — H&P (View-Only) (Signed)
GI Office Note    Referring Provider: Assunta Found, MD Primary Care Physician:  Assunta Found, MD  Primary Gastroenterologist: Roetta Sessions, MD   Chief Complaint   Chief Complaint  Patient presents with   Follow-up    Doing well, no issues    History of Present Illness   Luis Gomez is a 67 y.o. male presenting today for follow-up.  Last seen as a walk-in appointment on July 31, 2023 with concern for abnormal stools.  Presented without formal Spanish interpreter.  Longtime significant other provided interpretation.  Patient hospitalized back in September with sigmoid diverticulitis with small fluid collection that could be developing abscess.  He was seen by general surgery who recommended antibiotics.  IR was consulted and they felt like this was a diverticula and not an abscess.  When patient was seen he was concerned that he was passing mucus in the stool and passing only small amounts of soft stool multiple times daily.  No diarrhea.  Recommended completing antibiotics, adding back some soft foods and eventually adding high-fiber diet.  Followed up with general surgery yesterday with no complaints, symptoms resolved.Colonoscopy was recommended.  Today: doing well. BMs 1-2 times per day. No melena, brbpr. Abdominal pain resolved. Appetite normal. No n/v. No heartburn. No dysphagia.   CT abdomen pelvis with contrast September 16: IMPRESSION: Changes consistent with acute sigmoid diverticulitis with a small extraluminal fluid collection which may represent a developing abscess. It measures 1 cm in greatest dimension lying between the sigmoid and the bladder as described.   EGD May 2021:  -Severe erosive/ulcerative reflux esophagitis extending up to 8 cm from the GE junction. -Noncritical Schatzki ring not manipulated. -Medium-sized hiatal hernia.   Colonoscopy November 2021: -Diverticulosis in the sigmoid colon and in the descending colon. -Two 3 to 8  mm polyps in the ascending colon and in the cecum, removed with a cold snare. Resected and retrieved.  Pathology revealed sessile serrated polyp, tubular adenoma. -The examination was otherwise normal on direct and retroflexion views. I suspect prior abdominal pain and abnormal CT findings secondary to diverticulitis which is resolved. -Next colonoscopy in 5 years.   Medications   Current Outpatient Medications  Medication Sig Dispense Refill   benzonatate (TESSALON) 200 MG capsule Take 200 mg by mouth 3 (three) times daily.     ezetimibe (ZETIA) 10 MG tablet Take 10 mg by mouth daily.     [name] 150-150-200-10 MG TABS tablet TAKE 1 TABLET BY MOUTH DAILY     methylcellulose oral powder Take 1 packet by mouth daily.     pantoprazole (PROTONIX) 40 MG tablet Take 1 tablet (40 mg total) by mouth daily before breakfast. 90 tablet 3   predniSONE (DELTASONE) 10 MG tablet Take by mouth.     valACYclovir (VALTREX) 500 MG tablet SMARTSIG:2 Tablet(s) By Mouth Every 12 Hours     No current facility-administered medications for this visit.    Allergies   Allergies as of 08/28/2023   (No Known Allergies)     Past Medical History   Past Medical History:  Diagnosis Date   Diverticulosis    Genital HSV    GERD (gastroesophageal reflux disease)    Last EGD->07/02/06 normal   HIV disease (HCC)    S/P colonoscopy 07/02/2006   Left sided diverticula    Past Surgical History   Past Surgical History:  Procedure Laterality Date   BACK SURGERY     COLONOSCOPY  07/03/2006   Normal rectum, left-sided  diverticulum   COLONOSCOPY N/A 02/07/2018   [place]ourk: Sessile serrated polyp removed from the colon, tubular adenoma.  Next colonoscopy in 5 years.   COLONOSCOPY N/A 09/14/2020   Diverticulosis, 2 polyps removed (sessile serrated polyp, tubular adenoma), next colonoscopy in 5 years.   ESOPHAGOGASTRODUODENOSCOPY  07/03/2006   Normal esophagus, stomach, D1, D2.   ESOPHAGOGASTRODUODENOSCOPY N/A  04/01/2020   [place]ourk: Severe erosive/ulcerative reflux esophagitis extending up 8 cm from the GE junction.  Noncritical Schatzki ring not manipulated.  Medium sized hiatal hernia.   POLYPECTOMY  02/07/2018   Procedure: POLYPECTOMY;  Surgeon: Corbin Ade, MD;  Location: [facility];  Service: Endoscopy;;  colon    POLYPECTOMY  09/14/2020   Procedure: POLYPECTOMY;  Surgeon: Corbin Ade, MD;  Location: [facility];  Service: Endoscopy;;    Past Family History   Family History  Problem Relation Age of Onset   Colon cancer Neg Hx        parents passed over 40 years ago, UNKNOWN if colon cancer    Past Social History   Social History   Socioeconomic History   Marital status: Divorced    Spouse name: Not on file   Number of children: 4   Years of education: Not on file   Highest education level: Not on file  Occupational History   Occupation: Equity    Employer: [employer]  Tobacco Use   Smoking status: Never   Smokeless tobacco: Never  Vaping Use   Vaping status: Never Used  Substance and Sexual Activity   Alcohol use: No   Drug use: No   Sexual activity: Not Currently    Comment: pt. given condoms  Other Topics Concern   Not on file  Social History Narrative   Not on file   Social Determinants of Health   Financial Resource Strain: Not on file  Food Insecurity: No Food Insecurity (07/22/2023)   Hunger Vital Sign    Worried About Running Out of Food in the Last Year: Never true    Ran Out of Food in the Last Year: Never true  Transportation Needs: No Transportation Needs (07/22/2023)   PRAPARE - Administrator, Civil Service (Medical): No    Lack of Transportation (Non-Medical): No  Physical Activity: Not on file  Stress: Not on file  Social Connections: Not on file  Intimate Partner Violence: Not At Risk (07/22/2023)   Humiliation, Afraid, Rape, and Kick questionnaire    Fear of Current or Ex-Partner: No    Emotionally Abused: No     Physically Abused: No    Sexually Abused: No    Review of Systems   General: Negative for anorexia, weight loss, fever, chills, fatigue, weakness. ENT: Negative for hoarseness, difficulty swallowing , nasal congestion. CV: Negative for chest pain, angina, palpitations, dyspnea on exertion, peripheral edema.  Respiratory: Negative for dyspnea at rest, dyspnea on exertion, cough, sputum, wheezing.  GI: See history of present illness. GU:  Negative for dysuria, hematuria, urinary incontinence, urinary frequency, nocturnal urination.  Endo: Negative for unusual weight change.     Physical Exam   BP (!) 148/90 (BP Location: Right Arm, Patient Position: Sitting, Cuff Size: Large)   Pulse 82   Temp 98 F (36.7 C) (Oral)   Ht 5\' 6"  (1.676 m)   Wt 190 lb 9.6 oz (86.5 kg)   SpO2 96%   BMI 30.76 kg/m    General: Well-nourished, well-developed in no acute distress.  Eyes: No icterus.  Mouth: Oropharyngeal mucosa moist and pink   Lungs: Clear to auscultation bilaterally.  Heart: Regular rate and rhythm, no murmurs rubs or gallops.  Abdomen: Bowel sounds are normal, nontender, nondistended, no hepatosplenomegaly or masses,  no abdominal bruits or hernia , no rebound or guarding.  Rectal: not performed  Extremities: No lower extremity edema. No clubbing or deformities. Neuro: Alert and oriented x 4   Skin: Warm and dry, no jaundice.   Psych: Alert and cooperative, normal mood and affect.  Labs   Lab Results  Component Value Date   NA 136 07/27/2023   CL 99 07/27/2023   K 4.0 07/27/2023   CO2 26 07/27/2023   BUN 8 07/27/2023   CREATININE 0.85 07/27/2023   GFRNONAA >60 07/27/2023   CALCIUM 9.3 07/27/2023   ALBUMIN 3.1 (L) 07/24/2023   GLUCOSE 137 (H) 07/27/2023   Lab Results  Component Value Date   ALT 16 07/24/2023   AST 12 (L) 07/24/2023   ALKPHOS 57 07/24/2023   BILITOT 0.9 07/24/2023   Lab Results  Component Value Date   WBC 13.1 (H) 07/27/2023   HGB 13.8 07/27/2023    HCT 41.4 07/27/2023   MCV 82.6 07/27/2023   PLT 396 07/27/2023    Imaging Studies   No results found.  Assessment/Plan:   Diverticulitis/abnormal colon on CT/history of adenomatous colon polyps: -abdominal pain resolved and bowel function as returned to normal -recommend updating colonoscopy given abnormal colon on CT and last colonoscopy nearly 3 years ago.  -patient to add back high fiber diet at this time -patient education for high fiber diet/diverticulosis management discussed and provided in [place] and Spanish today -colonoscopy in the near future. ASA 2.  I have discussed the risks, alternatives, benefits with regards to but not limited to the risk of reaction to medication, bleeding, infection, perforation and the patient is agreeable to proceed. Written consent to be obtained. -patient to call if any recurrent GI symptoms, abdominal pain.    Leanna Battles. Melvyn Neth, MHS, PA-C [facility]

## 2023-08-30 DIAGNOSIS — E1165 Type 2 diabetes mellitus with hyperglycemia: Secondary | ICD-10-CM | POA: Diagnosis not present

## 2023-09-05 ENCOUNTER — Encounter (HOSPITAL_COMMUNITY)
Admission: RE | Disposition: A | Discharge status: Home / Self Care | Payer: Self-pay | Source: Home / Self Care | Attending: Internal Medicine

## 2023-09-05 ENCOUNTER — Ambulatory Visit (HOSPITAL_COMMUNITY): Payer: 59 | Admitting: Anesthesiology

## 2023-09-05 ENCOUNTER — Ambulatory Visit (HOSPITAL_COMMUNITY)
Admission: RE | Admit: 2023-09-05 | Discharge: 2023-09-05 | Disposition: A | Discharge status: Home / Self Care | Payer: 59 | Attending: Internal Medicine | Admitting: Internal Medicine

## 2023-09-05 ENCOUNTER — Ambulatory Visit (HOSPITAL_BASED_OUTPATIENT_CLINIC_OR_DEPARTMENT_OTHER): Payer: 59 | Admitting: Anesthesiology

## 2023-09-05 ENCOUNTER — Encounter (HOSPITAL_COMMUNITY): Payer: Self-pay | Admitting: Internal Medicine

## 2023-09-05 ENCOUNTER — Other Ambulatory Visit: Payer: Self-pay

## 2023-09-05 DIAGNOSIS — K51418 Inflammatory polyps of colon with other complication: Secondary | ICD-10-CM | POA: Diagnosis not present

## 2023-09-05 DIAGNOSIS — K222 Esophageal obstruction: Secondary | ICD-10-CM | POA: Insufficient documentation

## 2023-09-05 DIAGNOSIS — K219 Gastro-esophageal reflux disease without esophagitis: Secondary | ICD-10-CM | POA: Insufficient documentation

## 2023-09-05 DIAGNOSIS — K5140 Inflammatory polyps of colon without complications: Secondary | ICD-10-CM | POA: Diagnosis not present

## 2023-09-05 DIAGNOSIS — R933 Abnormal findings on diagnostic imaging of other parts of digestive tract: Secondary | ICD-10-CM

## 2023-09-05 DIAGNOSIS — I10 Essential (primary) hypertension: Secondary | ICD-10-CM | POA: Diagnosis not present

## 2023-09-05 DIAGNOSIS — R948 Abnormal results of function studies of other organs and systems: Secondary | ICD-10-CM | POA: Insufficient documentation

## 2023-09-05 DIAGNOSIS — K635 Polyp of colon: Secondary | ICD-10-CM | POA: Diagnosis not present

## 2023-09-05 DIAGNOSIS — Z21 Asymptomatic human immunodeficiency virus [HIV] infection status: Secondary | ICD-10-CM | POA: Diagnosis not present

## 2023-09-05 DIAGNOSIS — K5730 Diverticulosis of large intestine without perforation or abscess without bleeding: Secondary | ICD-10-CM | POA: Insufficient documentation

## 2023-09-05 DIAGNOSIS — D122 Benign neoplasm of ascending colon: Secondary | ICD-10-CM | POA: Insufficient documentation

## 2023-09-05 HISTORY — PX: COLONOSCOPY WITH PROPOFOL: SHX5780

## 2023-09-05 HISTORY — PX: POLYPECTOMY: SHX5525

## 2023-09-05 SURGERY — COLONOSCOPY WITH PROPOFOL
Anesthesia: General

## 2023-09-05 MED ORDER — SODIUM CHLORIDE 0.9% FLUSH: 10.0000 mL | Freq: Two times a day (BID) | INTRAVENOUS | Status: DC

## 2023-09-05 MED ORDER — LACTATED RINGERS IV SOLN: INTRAVENOUS | Status: DC | PRN

## 2023-09-05 MED ORDER — PROPOFOL 500 MG/50ML IV EMUL
INTRAVENOUS | Status: DC | PRN
  Administered 2023-09-05: 125 ug/kg/min via INTRAVENOUS
  Administered 2023-09-05: 90 ug/kg/min via INTRAVENOUS

## 2023-09-05 MED ORDER — EPHEDRINE SULFATE (PRESSORS) 50 MG/ML IJ SOLN
INTRAMUSCULAR | Status: DC | PRN
  Administered 2023-09-05: 5 mg via INTRAVENOUS
  Administered 2023-09-05 (×2): 2.5 mg via INTRAVENOUS

## 2023-09-05 MED ORDER — PHENYLEPHRINE HCL (PRESSORS) 10 MG/ML IV SOLN
INTRAVENOUS | Status: DC | PRN
  Administered 2023-09-05: 80 ug via INTRAVENOUS
  Administered 2023-09-05 (×2): 40 ug via INTRAVENOUS

## 2023-09-05 MED ORDER — PROPOFOL 10 MG/ML IV BOLUS
INTRAVENOUS | Status: DC | PRN
  Administered 2023-09-05: 40 mg via INTRAVENOUS
  Administered 2023-09-05: 150 mg via INTRAVENOUS

## 2023-09-05 NOTE — Op Note (Signed)
[facility] Patient Name: Luis Gomez Procedure Date: 09/05/2023 10:01 AM MRN: [id number] Date of Birth: [birth year] Attending MD: Gennette Pac , MD, [id number] CSN: [id number] Age: 67 Admit Type: Outpatient Procedure:                Colonoscopy Indications:              Abnormal CT of the GI tract; clinically, recent                            diverticulitis Providers:                Gennette Pac, MD, Francoise Ceo RN, RN,                            Sheran Fava, Pandora Leiter, Technician Referring MD:              Medicines:                Propofol per Anesthesia Complications:            No immediate complications. Estimated Blood Loss:     Estimated blood loss was minimal. Procedure:                Pre-Anesthesia Assessment:                           - Prior to the procedure, a History and Physical                            was performed, and patient medications and                            allergies were reviewed. The patient's tolerance of                            previous anesthesia was also reviewed. The risks                            and benefits of the procedure and the sedation                            options and risks were discussed with the patient.                            All questions were answered, and informed consent                            was obtained. Prior Anticoagulants: The patient has                            taken no anticoagulant or antiplatelet agents. ASA                            Grade Assessment: II - A patient with mild systemic  disease. After reviewing the risks and benefits,                            the patient was deemed in satisfactory condition to                            undergo the procedure.                           After obtaining informed consent, the colonoscope                            was passed under direct vision. Throughout the                             procedure, the patient's blood pressure, pulse, and                            oxygen saturations were monitored continuously. The                            [phone]) scope was introduced through the                            anus and advanced to the the cecum, identified by                            appendiceal orifice and ileocecal valve. The                            colonoscopy was performed without difficulty. The                            patient tolerated the procedure well. The quality                            of the bowel preparation was adequate. The entire                            colon was well visualized. Scope In: 10:17:00 AM Scope Out: 11:04:06 AM Scope Withdrawal Time: 0 hours 40 minutes 0 seconds  Total Procedure Duration: 0 hours 47 minutes 6 seconds  Findings:      The perianal and digital rectal examinations were normal.      Multiple large-mouthed and medium-mouthed diverticula were found in the       entire colon. Stiff sigmoid segment densely populated [place]       diverticula in this segment. (2) 7 mm sigmoid polyps present 3 ascending       colon polyps 6 to 7 mm semipedunculated. The remainder of colonic mucosa       appeared normal. The polyps were cold snared. 1 polyp in the sigmoid       segment with in such a location that I could not approach it with the       adult colonoscope. I obtained the pediatric colonoscope and was  able to       gain good access to the 6 o'clock position and took this off with the       snare. Impression:               - Diverticulosis in the entire examined colon.                            Multiple polyps removed as described above Moderate Sedation:      Moderate (conscious) sedation was personally administered by an       anesthesia professional. The following parameters were monitored: oxygen       saturation, heart rate, blood pressure, respiratory rate, EKG, adequacy       of pulmonary ventilation,  and response to care. Recommendation:           - Patient has a contact number available for                            emergencies. The signs and symptoms of potential                            delayed complications were discussed with the                            patient. Return to normal activities tomorrow.                            Written discharge instructions were provided to the                            patient.                           - Advance diet as tolerated. Follow-up on pathology. Procedure Code(s):        --- Professional ---                           [phone], Colonoscopy, flexible; diagnostic, including                            collection of specimen(s) by brushing or washing,                            when performed (separate procedure) Diagnosis Code(s):        --- Professional ---                           K57.30, Diverticulosis of large intestine without                            perforation or abscess without bleeding                           R93.3, Abnormal findings on diagnostic imaging of  other parts of digestive tract CPT copyright 2022 American Medical Association. All rights reserved. The codes documented in this report are preliminary and upon coder review may  be revised to meet current compliance requirements. Luis Gomez. Luis Riles, MD Gennette Pac, MD 09/05/2023 11:13:12 AM This report has been signed electronically. Number of Addenda: 0

## 2023-09-05 NOTE — Interval H&P Note (Signed)
History and Physical Interval Note:  09/05/2023 10:06 AM  Luis Gomez  has presented today for surgery, with the diagnosis of abnormal colon on CT,history of colon polyps,diverticulitis.  The various methods of treatment have been discussed with the patient and family. After consideration of risks, benefits and other options for treatment, the patient has consented to  Procedure(s) with comments: COLONOSCOPY WITH PROPOFOL (N/A) - 11:00 am, asa 2 as a surgical intervention.  The patient's history has been reviewed, patient examined, no change in status, stable for surgery.  I have reviewed the patient's chart and labs.  Questions were answered to the patient's satisfaction.     Luis Gomez    No change.  Patient is clinically recovered from his bout of apparent diverticulitis.  Colonoscopy today per plan.  The risks, benefits, limitations, alternatives and imponderables have been reviewed with the patient. Questions have been answered. All parties are agreeable.

## 2023-09-05 NOTE — Anesthesia Procedure Notes (Signed)
Date/Time: 09/05/2023 10:11 AM  Performed by: Franco Nones, CRNAPre-anesthesia Checklist: Patient identified, Emergency Drugs available, Suction available, Timeout performed and Patient being monitored Patient Re-evaluated:Patient Re-evaluated prior to induction Oxygen Delivery Method: Nasal Cannula

## 2023-09-05 NOTE — Discharge Instructions (Addendum)
  Colonoscopy Discharge Instructions  Read the instructions outlined below and refer to this sheet in the next few weeks. These discharge instructions provide you with general information on caring for yourself after you leave the hospital. Your doctor may also give you specific instructions. While your treatment has been planned according to the most current medical practices available, unavoidable complications occasionally occur. If you have any problems or questions after discharge, call Dr. Jena Gauss at [phone]. ACTIVITY You may resume your regular activity, but move at a slower pace for the next 24 hours.  Take frequent rest periods for the next 24 hours.  Walking will help get rid of the air and reduce the bloated feeling in your belly (abdomen).  No driving for 24 hours (because of the medicine (anesthesia) used during the test).   Do not sign any important legal documents or operate any machinery for 24 hours (because of the anesthesia used during the test).  NUTRITION Drink plenty of fluids.  You may resume your normal diet as instructed by your doctor.  Begin with a light meal and progress to your normal diet. Heavy or fried foods are harder to digest and may make you feel sick to your stomach (nauseated).  Avoid alcoholic beverages for 24 hours or as instructed.  MEDICATIONS You may resume your normal medications unless your doctor tells you otherwise.  WHAT YOU CAN EXPECT TODAY Some feelings of bloating in the abdomen.  Passage of more gas than usual.  Spotting of blood in your stool or on the toilet paper.  IF YOU HAD POLYPS REMOVED DURING THE COLONOSCOPY: No aspirin products for 7 days or as instructed.  No alcohol for 7 days or as instructed.  Eat a soft diet for the next 24 hours.  FINDING OUT THE RESULTS OF YOUR TEST Not all test results are available during your visit. If your test results are not back during the visit, make an appointment with your caregiver to find out the  results. Do not assume everything is normal if you have not heard from your caregiver or the medical facility. It is important for you to follow up on all of your test results.  SEEK IMMEDIATE MEDICAL ATTENTION IF: You have more than a spotting of blood in your stool.  Your belly is swollen (abdominal distention).  You are nauseated or vomiting.  You have a temperature over 101.  You have abdominal pain or discomfort that is severe or gets worse throughout the day.       You have diverticulosis throughout your colon.  5 polyps removed  Further recommendations to follow pending review of pathology report   at patient request, called Jackolyn Confer [phone] to voicemail left a message.

## 2023-09-05 NOTE — Anesthesia Preprocedure Evaluation (Signed)
Anesthesia Evaluation  Patient identified by MRN, date of birth, ID band Patient awake    Reviewed: Allergy & Precautions, H&P , NPO status , Patient's Chart, lab work & pertinent test results, reviewed documented beta blocker date and time   Airway Mallampati: II  TM Distance: >3 FB Neck ROM: full    Dental no notable dental hx.    Pulmonary neg pulmonary ROS   Pulmonary exam normal breath sounds clear to auscultation       Cardiovascular Exercise Tolerance: Good hypertension, negative cardio ROS  Rhythm:regular Rate:Normal     Neuro/Psych negative neurological ROS  negative psych ROS   GI/Hepatic negative GI ROS, Neg liver ROS,GERD  ,,  Endo/Other  negative endocrine ROSdiabetes    Renal/GU negative Renal ROS  negative genitourinary   Musculoskeletal   Abdominal   Peds  Hematology  (+) HIV  Anesthesia Other Findings   Reproductive/Obstetrics negative OB ROS                             Anesthesia Physical Anesthesia Plan  ASA: 3  Anesthesia Plan: General   Post-op Pain Management:    Induction:   PONV Risk Score and Plan: Propofol infusion  Airway Management Planned:   Additional Equipment:   Intra-op Plan:   Post-operative Plan:   Informed Consent: I have reviewed the patients History and Physical, chart, labs and discussed the procedure including the risks, benefits and alternatives for the proposed anesthesia with the patient or authorized representative who has indicated his/her understanding and acceptance.     Dental Advisory Given  Plan Discussed with: CRNA  Anesthesia Plan Comments:        Anesthesia Quick Evaluation

## 2023-09-05 NOTE — Transfer of Care (Signed)
Immediate Anesthesia Transfer of Care Note  Patient: Luis Gomez  Procedure(s) Performed: COLONOSCOPY WITH PROPOFOL POLYPECTOMY  Patient Location: Endoscopy Unit  Anesthesia Type:General  Level of Consciousness: awake and patient cooperative  Airway & Oxygen Therapy: Patient Spontanous Breathing  Post-op Assessment: Report given to RN and Post -op Vital signs reviewed and stable  Post vital signs: Reviewed and stable  Last Vitals:  Vitals Value Taken Time  BP 88/55 09/05/23 1111  Temp 36.8 C 09/05/23 1111  Pulse 63 09/05/23 1111  Resp 21 09/05/23 1111  SpO2 99 % 09/05/23 1111    Last Pain:  Vitals:   09/05/23 1111  TempSrc: Axillary  PainSc: 0-No pain      Patients Stated Pain Goal: 7 (09/05/23 0948)  Complications: No notable events documented.

## 2023-09-06 ENCOUNTER — Encounter: Payer: Self-pay | Admitting: Internal Medicine

## 2023-09-06 LAB — SURGICAL PATHOLOGY

## 2023-09-10 ENCOUNTER — Encounter (HOSPITAL_COMMUNITY): Payer: Self-pay | Admitting: Internal Medicine

## 2023-09-10 NOTE — Anesthesia Postprocedure Evaluation (Signed)
Anesthesia Post Note  Patient: Luis Gomez  Procedure(s) Performed: COLONOSCOPY WITH PROPOFOL POLYPECTOMY  Patient location during evaluation: Phase II Anesthesia Type: General Level of consciousness: awake Pain management: pain level controlled Vital Signs Assessment: post-procedure vital signs reviewed and stable Respiratory status: spontaneous breathing and respiratory function stable Cardiovascular status: blood pressure returned to baseline and stable Postop Assessment: no headache and no apparent nausea or vomiting Anesthetic complications: no Comments: Late entry   No notable events documented.   Last Vitals:  Vitals:   09/05/23 1111 09/05/23 1116  BP: (!) 88/55 105/67  Pulse: 63   Resp: (!) 21   Temp: 36.8 C   SpO2: 99%     Last Pain:  Vitals:   09/05/23 1111  TempSrc: Axillary  PainSc: 0-No pain                 Windell Norfolk

## 2023-09-24 ENCOUNTER — Other Ambulatory Visit: Payer: Self-pay

## 2023-09-24 ENCOUNTER — Other Ambulatory Visit (HOSPITAL_COMMUNITY)
Admission: RE | Admit: 2023-09-24 | Discharge: 2023-09-24 | Disposition: A | Discharge status: Home / Self Care | Payer: 59 | Source: Ambulatory Visit | Attending: Internal Medicine | Admitting: Internal Medicine

## 2023-09-24 ENCOUNTER — Ambulatory Visit (INDEPENDENT_AMBULATORY_CARE_PROVIDER_SITE_OTHER): Payer: 59 | Admitting: Internal Medicine

## 2023-09-24 ENCOUNTER — Encounter: Payer: Self-pay | Admitting: Internal Medicine

## 2023-09-24 VITALS — BP 138/79 | HR 88 | Ht 66.0 in | Wt 191.0 lb

## 2023-09-24 DIAGNOSIS — Z113 Encounter for screening for infections with a predominantly sexual mode of transmission: Secondary | ICD-10-CM | POA: Diagnosis present

## 2023-09-24 DIAGNOSIS — E782 Mixed hyperlipidemia: Secondary | ICD-10-CM

## 2023-09-24 DIAGNOSIS — Z79899 Other long term (current) drug therapy: Secondary | ICD-10-CM

## 2023-09-24 DIAGNOSIS — B20 Human immunodeficiency virus [HIV] disease: Secondary | ICD-10-CM

## 2023-09-24 DIAGNOSIS — E785 Hyperlipidemia, unspecified: Secondary | ICD-10-CM | POA: Diagnosis not present

## 2023-09-24 MED ORDER — ROSUVASTATIN CALCIUM 10 MG PO TABS: 10.0000 mg | ORAL_TABLET | Freq: Every day | ORAL | 11 refills | Status: DC

## 2023-09-24 MED ORDER — GENVOYA 150-150-200-10 MG PO TABS: 1.0000 | ORAL_TABLET | Freq: Every day | ORAL | 11 refills | Status: DC

## 2023-09-24 NOTE — Assessment & Plan Note (Signed)
With new research coming out by way of the REPRIEVE study regarding cardiovascular event risk reduction for PLWH, I discussed that over the 8 year trial initiation of statin (pitavastatin) therapy was shown to reduce CV disease by 35% independent of other risk factors.   The 10-year ASCVD risk score (Arnett DK, et al., 2019) is: 33.8%   Values used to calculate the score:     Age: 67 years     Sex: Male     Is Non-Hispanic African American: No     Diabetic: Yes     Tobacco smoker: No     Systolic Blood Pressure: 138 mmHg     Is BP treated: No     HDL Cholesterol: 36 mg/dL     Total Cholesterol: 215 mg/dL  We discussed the patient's 10-year CVD Risk Score to be at least moderately elevated, and would benefit from intervention given HIV+ and > 95 yo.    Given co-morbidities will start rosuvastatin with and defer to primary care to optimize dose. Lifestyle recommendations also discussed today.

## 2023-09-24 NOTE — Assessment & Plan Note (Signed)
Doing well on [name] and no changes indicated  Reviewed previous labs with him.  Follow up in 9 months.

## 2023-09-24 NOTE — Progress Notes (Signed)
   Subjective:    Patient ID: Luis Gomez, male    DOB: 12-31-1955, 67 y.o.   MRN: [id number]  HPI Luis Gomez is here for follow up of HIV He continues on [name] with no missed doses.  No new issues.  No problems getting or taking his medication.  Feels well today.     Review of Systems  Constitutional:  Negative for fatigue.  Gastrointestinal:  Negative for diarrhea.  Skin:  Negative for rash.       Objective:   Physical Exam Eyes:     General: No scleral icterus. Pulmonary:     Effort: Pulmonary effort is normal.  Neurological:     Mental Status: He is alert.   SH: no tobacco        Assessment & Plan:

## 2023-09-24 NOTE — Addendum Note (Signed)
Addended by: Gardiner Barefoot on: 09/24/2023 08:48 AM   Modules accepted: Orders

## 2023-09-24 NOTE — Assessment & Plan Note (Signed)
Will screen 

## 2023-09-25 LAB — T-HELPER CELL (CD4) - (RCID CLINIC ONLY)
CD4 % Helper T Cell: 26 % — ABNORMAL LOW (ref 33–65)
CD4 T Cell Abs: 598 /uL (ref 400–1790)

## 2023-09-25 LAB — URINE CYTOLOGY ANCILLARY ONLY
Chlamydia: NEGATIVE
Comment: NEGATIVE
Comment: NORMAL
Neisseria Gonorrhea: NEGATIVE

## 2023-09-26 LAB — LIPID PANEL
Cholesterol: 177 mg/dL (ref <200)
HDL: 31 mg/dL — ABNORMAL LOW (ref >=40)
LDL Cholesterol (Calc): 115 mg/dL — ABNORMAL HIGH
Non-HDL Cholesterol (Calc): 146 mg/dL — ABNORMAL HIGH (ref <130)
Total CHOL/HDL Ratio: 5.7 (calc) — ABNORMAL HIGH (ref <5.0)
Triglycerides: 190 mg/dL — ABNORMAL HIGH (ref <150)

## 2023-09-26 LAB — HIV-1 RNA QUANT-NO REFLEX-BLD
HIV 1 RNA Quant: NOT DETECTED {copies}/mL
HIV-1 RNA Quant, Log: NOT DETECTED {Log_copies}/mL

## 2023-09-26 LAB — RPR: RPR Ser Ql: NONREACTIVE

## 2023-10-08 ENCOUNTER — Ambulatory Visit (INDEPENDENT_AMBULATORY_CARE_PROVIDER_SITE_OTHER): Payer: 59 | Admitting: Gastroenterology

## 2023-10-08 ENCOUNTER — Encounter: Payer: Self-pay | Admitting: Gastroenterology

## 2023-10-08 VITALS — BP 145/85 | HR 87 | Temp 97.6°F | Ht 66.0 in | Wt 189.6 lb

## 2023-10-08 DIAGNOSIS — K219 Gastro-esophageal reflux disease without esophagitis: Secondary | ICD-10-CM

## 2023-10-08 DIAGNOSIS — R197 Diarrhea, unspecified: Secondary | ICD-10-CM | POA: Diagnosis not present

## 2023-10-08 DIAGNOSIS — K625 Hemorrhage of anus and rectum: Secondary | ICD-10-CM | POA: Diagnosis not present

## 2023-10-08 NOTE — Patient Instructions (Addendum)
Continue pantoprazole once daily for acid reflux. Monitor for any further blood in the stools or persistent loose stools. Please call with any questions or concerns and asked to speak to the nurse so that we can use formal interpretor when communicating with you. This will help [name] better determine if you need to be seen or if we can give medical advise over the phone.  Return to the office as needed.   1. Contine con pantoprazol una vez al da para el reflujo cido. 2. Controle si hay ms sangre en las heces o heces blandas persistentes. Llame si tiene alguna pregunta o inquietud y pida hablar con la enfermera para que podamos utilizar un intrprete formal cuando nos comuniquemos con usted. Esto nos ayudar a determinar mejor si necesita que lo [place] o si podemos brindarle asesoramiento mdico por telfono.  3. Regrese a la oficina segn sea necesario.

## 2023-10-08 NOTE — Progress Notes (Signed)
GI Office Note    Referring Provider: Assunta Found, MD Primary Care Physician:  Assunta Found, MD  Primary Gastroenterologist: Roetta Sessions, MD   Chief Complaint   Chief Complaint  Patient presents with   Follow-up    History of Present Illness   Luis Gomez is a 67 y.o. male presenting today for follow-up.  Presents with formal language interpreter.  Seen in October.  Patient was hospitalized for sigmoid diverticulitis with small fluid collection that could represent developing abscess back in September.  He was seen by general surgery who recommended antibiotics.  IR was consulted and they felt like this was a diverticula and not an abscess.  Patient updated colonoscopy since his last office visit. See below  Today: Sunday after eating his meal, had five BMS over five hours. Happened only Sunday. Saw some brbpr on toilet tissues. Yesterday two BMs, normal stool. No blood yesterday. No abdominal pain. Good appetite.  Notes that he went to a party and had meal including beans Saturday evening, may have eaten too much.  Since we last saw him he had actually been doing very well up until Sunday when he developed diarrhea.   CT abdomen pelvis with contrast September 16: IMPRESSION: Changes consistent with acute sigmoid diverticulitis with a small extraluminal fluid collection which may represent a developing abscess. It measures 1 cm in greatest dimension lying between the sigmoid and the bladder as described.   Colonoscopy October 2024: -Diverticulosis in the entire examined colon -Multiple polyps removed, ascending colon polyp was tubular adenoma.  Sigmoid colon polyp inflammatory with granulation tissue and active inflammation, negative for dysplasia. -Recommend repeat colonoscopy in 7 years  EGD May 2021:  -Severe erosive/ulcerative reflux esophagitis extending up to 8 cm from the GE junction. -Noncritical Schatzki ring not manipulated. -Medium-sized hiatal  hernia.   Colonoscopy November 2021: -Diverticulosis in the sigmoid colon and in the descending colon. -Two 3 to 8 mm polyps in the ascending colon and in the cecum, removed with a cold snare. Resected and retrieved.  Pathology revealed sessile serrated polyp, tubular adenoma. -The examination was otherwise normal on direct and retroflexion views. I suspect prior abdominal pain and abnormal CT findings secondary to diverticulitis which is resolved. -Next colonoscopy in 5 years.   Medications   Current Outpatient Medications  Medication Sig Dispense Refill   Ascorbic Acid (VITAMIN C) 1000 MG tablet Take 2,000 mg by mouth daily.     Cholecalciferol (D3 5000) 125 MCG (5000 UT) capsule Take 5,000 Units by mouth daily.     ezetimibe (ZETIA) 10 MG tablet Take 10 mg by mouth daily.     [name] 150-150-200-10 MG TABS tablet TAKE 1 TABLET BY MOUTH DAILY 30 tablet 11   pantoprazole (PROTONIX) 40 MG tablet Take 1 tablet (40 mg total) by mouth daily before breakfast. 90 tablet 3   rosuvastatin (CRESTOR) 10 MG tablet Take 1 tablet (10 mg total) by mouth daily. 30 tablet 11   Zinc 50 MG TABS Take 50 mg by mouth daily.     No current facility-administered medications for this visit.    Allergies   Allergies as of 10/08/2023   (No Known Allergies)      Review of Systems   General: Negative for anorexia, weight loss, fever, chills, fatigue, weakness. ENT: Negative for hoarseness, difficulty swallowing , nasal congestion. CV: Negative for chest pain, angina, palpitations, dyspnea on exertion, peripheral edema.  Respiratory: Negative for dyspnea at rest, dyspnea on exertion, cough, sputum,  wheezing.  GI: See history of present illness. GU:  Negative for dysuria, hematuria, urinary incontinence, urinary frequency, nocturnal urination.  Endo: Negative for unusual weight change.     Physical Exam   BP (!) 145/85 (BP Location: Right Arm, Patient Position: Sitting, Cuff Size: Normal)   Pulse 87    Temp 97.6 F (36.4 C) (Oral)   Ht 5\' 6"  (1.676 m)   Wt 189 lb 9.6 oz (86 kg)   SpO2 97%   BMI 30.60 kg/m    General: Well-nourished, well-developed in no acute distress.  Eyes: No icterus. Mouth: Oropharyngeal mucosa moist and pink ,  Abdomen: Bowel sounds are normal, nontender, nondistended, no hepatosplenomegaly or masses,  no abdominal bruits or hernia , no rebound or guarding.  Rectal: Not performed Extremities: No lower extremity edema. No clubbing or deformities. Neuro: Alert and oriented x 4   Skin: Warm and dry, no jaundice.   Psych: Alert and cooperative, normal mood and affect.  Labs   Lab Results  Component Value Date   NA 136 07/27/2023   CL 99 07/27/2023   K 4.0 07/27/2023   CO2 26 07/27/2023   BUN 8 07/27/2023   CREATININE 0.85 07/27/2023   GFRNONAA >60 07/27/2023   CALCIUM 9.3 07/27/2023   ALBUMIN 3.1 (L) 07/24/2023   GLUCOSE 137 (H) 07/27/2023   Lab Results  Component Value Date   WBC 13.1 (H) 07/27/2023   HGB 13.8 07/27/2023   HCT 41.4 07/27/2023   MCV 82.6 07/27/2023   PLT 396 07/27/2023   Lab Results  Component Value Date   ALT 16 07/24/2023   AST 12 (L) 07/24/2023   ALKPHOS 57 07/24/2023   BILITOT 0.9 07/24/2023    Imaging Studies   No results found.  Assessment/Plan:   Diarrhea/rectal bleeding: -lasting less than 5-6 hours. Single episode of toilet tissue hematochezia, BMs now normal.  Suspect food related diarrhea with benign anorectal bleeding.  Reassuring colonoscopy in October. -Recommend monitoring for now.  If any recurrent bleeding or persistent diarrhea, he should let us know.  He should call with any questions or concerns, however since there is a language barrier I have asked that he always ask to speak to the nurse so that we can use formal language interpreter by phone.  This will help [name] determine whether or not he needs to come in for an office visit or able to advised over the phone -return office visit as  needed  GERD: -Continue pantoprazole 40 mg daily     Leanna Battles. Melvyn Neth, MHS, PA-C [facility]

## 2023-12-19 DIAGNOSIS — D518 Other vitamin B12 deficiency anemias: Secondary | ICD-10-CM | POA: Diagnosis not present

## 2023-12-19 DIAGNOSIS — R7301 Impaired fasting glucose: Secondary | ICD-10-CM | POA: Diagnosis not present

## 2023-12-19 DIAGNOSIS — E039 Hypothyroidism, unspecified: Secondary | ICD-10-CM | POA: Diagnosis not present

## 2023-12-19 DIAGNOSIS — E1165 Type 2 diabetes mellitus with hyperglycemia: Secondary | ICD-10-CM | POA: Diagnosis not present

## 2023-12-19 DIAGNOSIS — K219 Gastro-esophageal reflux disease without esophagitis: Secondary | ICD-10-CM | POA: Diagnosis not present

## 2023-12-19 DIAGNOSIS — Z0001 Encounter for general adult medical examination with abnormal findings: Secondary | ICD-10-CM | POA: Diagnosis not present

## 2023-12-19 DIAGNOSIS — E559 Vitamin D deficiency, unspecified: Secondary | ICD-10-CM | POA: Diagnosis not present

## 2023-12-19 DIAGNOSIS — I700 Atherosclerosis of aorta: Secondary | ICD-10-CM | POA: Diagnosis not present

## 2023-12-19 DIAGNOSIS — E785 Hyperlipidemia, unspecified: Secondary | ICD-10-CM | POA: Diagnosis not present

## 2024-02-12 ENCOUNTER — Encounter: Payer: Self-pay | Admitting: Gastroenterology

## 2024-03-24 ENCOUNTER — Ambulatory Visit (INDEPENDENT_AMBULATORY_CARE_PROVIDER_SITE_OTHER): Admitting: Internal Medicine

## 2024-03-24 ENCOUNTER — Encounter: Payer: Self-pay | Admitting: Internal Medicine

## 2024-03-24 VITALS — BP 136/81 | HR 69 | Temp 98.6°F | Ht 66.0 in | Wt 199.8 lb

## 2024-03-24 DIAGNOSIS — Z860101 Personal history of adenomatous and serrated colon polyps: Secondary | ICD-10-CM | POA: Diagnosis not present

## 2024-03-24 DIAGNOSIS — K2100 Gastro-esophageal reflux disease with esophagitis, without bleeding: Secondary | ICD-10-CM

## 2024-03-24 DIAGNOSIS — Z8719 Personal history of other diseases of the digestive system: Secondary | ICD-10-CM | POA: Diagnosis not present

## 2024-03-24 DIAGNOSIS — K5720 Diverticulitis of large intestine with perforation and abscess without bleeding: Secondary | ICD-10-CM

## 2024-03-24 DIAGNOSIS — Z860100 Personal history of colon polyps, unspecified: Secondary | ICD-10-CM

## 2024-03-24 NOTE — Progress Notes (Unsigned)
 Primary Care Physician:  Minus Amel, MD Primary Gastroenterologist:  Dr. Riley Cheadle  Pre-Procedure History & Physical: Via interpreter HPI:  Luis Gomez is a 68 y.o. male here for follow-up of uncomplicated diverticulitis last fall subsequent colonoscopy demonstrated polyps that were removed one was adenomatous the other inflammatory 7-year follow-up schedule he has not had any abdominal pain bowel function is good.  GI complaints.  History of reflux esophagitis.  On once daily PPI no breakthrough symptoms.  Denies dysphagia.  No Barrett's esophagus on 2021 EGD  Past Medical History:  Diagnosis Date   Diverticulosis    Genital HSV    GERD (gastroesophageal reflux disease)    Last EGD->07/02/06 normal   HIV disease (HCC)    S/P colonoscopy 07/02/2006   Left sided diverticula    Past Surgical History:  Procedure Laterality Date   BACK SURGERY     COLONOSCOPY  07/03/2006   Normal rectum, left-sided diverticulum   COLONOSCOPY N/A 02/07/2018   [name]: Sessile serrated polyp removed from the colon, tubular adenoma.  Next colonoscopy in 5 years.   COLONOSCOPY N/A 09/14/2020   Diverticulosis, 2 polyps removed (sessile serrated polyp, tubular adenoma), next colonoscopy in 5 years.   COLONOSCOPY WITH PROPOFOL  N/A 09/05/2023   Procedure: COLONOSCOPY WITH PROPOFOL ;  Surgeon: [name], MD;  [facility];  Service: Endoscopy;  Laterality: N/A;  11:00 am, asa 2   ESOPHAGOGASTRODUODENOSCOPY  07/03/2006   Normal esophagus, stomach, D1, D2.   ESOPHAGOGASTRODUODENOSCOPY N/A 04/01/2020   [name]: Severe erosive/ulcerative reflux esophagitis extending up 8 cm from the GE junction.  Noncritical Schatzki ring not manipulated.  Medium sized hiatal hernia.   POLYPECTOMY  02/07/2018   Procedure: POLYPECTOMY;  Surgeon: [name], MD;  [facility];  Service: Endoscopy;;  colon    POLYPECTOMY  09/14/2020   Procedure: POLYPECTOMY;  Surgeon: [name], MD;   [facility];  Service: Endoscopy;;   POLYPECTOMY  09/05/2023   Procedure: POLYPECTOMY;  Surgeon: [name], MD;  [facility];  Service: Endoscopy;;    Prior to Admission medications   Medication Sig Start Date End Date Taking? Authorizing Provider  Ascorbic Acid (VITAMIN C) 1000 MG tablet Take 2,000 mg by mouth daily.   Yes [provider]  Cholecalciferol (D3 5000) 125 MCG (5000 UT) capsule Take 5,000 Units by mouth daily.   Yes [provider]  ezetimibe  (ZETIA ) 10 MG tablet Take 10 mg by mouth daily. 07/30/23  Yes [provider]  [name]  150-150-200-10 MG TABS tablet TAKE 1 TABLET BY MOUTH DAILY 09/24/23 09/23/24 Yes Comer, Judithann Novas, MD  pantoprazole  (PROTONIX ) 40 MG tablet Take 1 tablet (40 mg total) by mouth daily before breakfast. 03/26/23  Yes Delman Ferns, NP  rosuvastatin  (CRESTOR ) 10 MG tablet Take 1 tablet (10 mg total) by mouth daily. 09/24/23  Yes Comer, Judithann Novas, MD  Zinc 50 MG TABS Take 50 mg by mouth daily.   Yes [provider]    Allergies as of 03/24/2024   (No Known Allergies)    Family History  Problem Relation Age of Onset   Colon cancer Neg Hx        parents passed over 40 years ago, UNKNOWN if colon cancer    Social History   Socioeconomic History   Marital status: Divorced    Spouse name: Not on file   Number of children: 4   Years of education: Not on file  Highest education level: Not on file  Occupational History   Occupation: Equity    Employer: [employer]  Tobacco Use   Smoking status: Never   Smokeless tobacco: Never  Vaping Use   Vaping status: Never Used  Substance and Sexual Activity   Alcohol  use: No   Drug use: No   Sexual activity: Not Currently    Comment: declined condoms  Other Topics Concern   Not on file  Social History Narrative   Not on file   Social Drivers of Health   Financial Resource Strain: Not on file  Food Insecurity: No Food Insecurity  (07/22/2023)   Hunger Vital Sign    Worried About Running Out of Food in the Last Year: Never true    Ran Out of Food in the Last Year: Never true  Transportation Needs: No Transportation Needs (07/22/2023)   PRAPARE - Administrator, Civil Service (Medical): No    Lack of Transportation (Non-Medical): No  Physical Activity: Not on file  Stress: Not on file  Social Connections: Not on file  Intimate Partner Violence: Not At Risk (07/22/2023)   Humiliation, Afraid, Rape, and Kick questionnaire    Fear of Current or Ex-Partner: No    Emotionally Abused: No    Physically Abused: No    Sexually Abused: No    Review of Systems: See HPI, otherwise negative ROS  Physical Exam: BP 136/81 (BP Location: Left Arm, Patient Position: Sitting, Cuff Size: Large)   Pulse 69   Temp 98.6 F (37 C) (Oral)   Ht 5\' 6"  (1.676 m)   Wt 199 lb 12.8 oz (90.6 kg)   SpO2 98%   BMI 32.25 kg/m  General:   Alert,  Well-developed, well-nourished, pleasant and cooperative in NAD Neck:  Supple; no masses or thyromegaly. No significant cervical adenopathy. Lungs:  Clear throughout to auscultation.   No wheezes, crackles, or rhonchi. No acute distress. Heart:  Regular rate and rhythm; no murmurs, clicks, rubs,  or gallops. Abdomen: Non-distended, normal bowel sounds.  Soft and nontender without appreciable mass or hepatosplenomegaly.  Pulses:  Normal pulses noted. Extremities:  Without clubbing or edema.  Impression/Plan: 68 year old Hispanic male with HIV, history of severe reflux esophagitis treated last fall for diverticulitis typical findings on CT follow-up colonoscopy demonstrated diverticulosis small polyps 1 adenomatous; due for surveillance examination 6 years from now.  Clinically, doing well.  Reviewed importance of a high-quality diet.  No way to prevent future episodes of diverticulitis as discussed with patient and his interpreter.  History of severe reflux esophagitis for which she ought  to be on a PPI daily indefinitely.  He seems to be well-controlled at this time.  Recommendations:  high-quality heart healthy high-fiber low-fat diet  Repeat colonoscopy in 6 years given history of colon polyps  Continue pantoprazole  or Protonix  40 mg daily indefinitely to control acid reflux.  Best taken 30 minutes before breakfast.  Unless something comes up, plan to see him back in 1 year.  Notice: This dictation was prepared with Dragon dictation along with smaller phrase technology. Any transcriptional errors that result from this process are unintentional and may not be corrected upon review.

## 2024-03-24 NOTE — Progress Notes (Signed)
 The 10-year ASCVD risk score (Arnett DK, et al., 2019) is: 33.7%   Values used to calculate the score:     Age: 68 years     Sex: Male     Is Non-Hispanic African American: No     Diabetic: Yes     Tobacco smoker: No     Systolic Blood Pressure: 136 mmHg     Is BP treated: No     HDL Cholesterol: 31 mg/dL     Total Cholesterol: 177 mg/dL  Currently prescribed rosuvastatin  10 mg.   Anderson Middlebrooks, BSN, RN

## 2024-03-24 NOTE — Patient Instructions (Signed)
 It was good to see you again today!  There is no way to prevent recurrent diverticulitis.  Hopefully, you will not have another attack.  Recommend a high-quality heart healthy high-fiber low-fat diet  Repeat colonoscopy in 6 years given history of colon polyps  Continue pantoprazole  or Protonix  40 mg daily indefinitely to control acid reflux.  Best taken 30 minutes before breakfast.  Unless something comes up, plan to see him back in 1 year.

## 2024-03-25 DIAGNOSIS — J22 Unspecified acute lower respiratory infection: Secondary | ICD-10-CM | POA: Diagnosis not present

## 2024-03-25 DIAGNOSIS — R6889 Other general symptoms and signs: Secondary | ICD-10-CM | POA: Diagnosis not present

## 2024-06-22 NOTE — Progress Notes (Unsigned)
 Subjective:  Chief complaint: follow-up for HIV disease on medications   Patient ID: Luis Gomez, male    DOB: 03-17-1956, 68 y.o.   MRN: [id number]  HPI  Discussed the use of AI scribe software for clinical note transcription with the patient, who gave verbal consent to proceed.  History of Present Illness   Luis Gomez is a 68 year old male with HIV who presents for routine follow-up and lab work.  He has been managing his HIV infection for approximately 15 to 16 years and is currently on [name] . His last lab results in November were satisfactory.  He is currently taking Crestor  and Protonix . He requests a copy of his lab results to share with his family doctor.       Past Medical History:  Diagnosis Date   Diverticulosis    Genital HSV    GERD (gastroesophageal reflux disease)    Last EGD->07/02/06 normal   HIV disease (HCC)    S/P colonoscopy 07/02/2006   Left sided diverticula    Past Surgical History:  Procedure Laterality Date   BACK SURGERY     COLONOSCOPY  07/03/2006   Normal rectum, left-sided diverticulum   COLONOSCOPY N/A 02/07/2018   Rourk: Sessile serrated polyp removed from the colon, tubular adenoma.  Next colonoscopy in 5 years.   COLONOSCOPY N/A 09/14/2020   Diverticulosis, 2 polyps removed (sessile serrated polyp, tubular adenoma), next colonoscopy in 5 years.   COLONOSCOPY WITH PROPOFOL  N/A 09/05/2023   Procedure: COLONOSCOPY WITH PROPOFOL ;  Surgeon: Shaaron Lamar HERO, MD;  [facility];  Service: Endoscopy;  Laterality: N/A;  11:00 am, asa 2   ESOPHAGOGASTRODUODENOSCOPY  07/03/2006   Normal esophagus, stomach, D1, D2.   ESOPHAGOGASTRODUODENOSCOPY N/A 04/01/2020   Rourk: Severe erosive/ulcerative reflux esophagitis extending up 8 cm from the GE junction.  Noncritical Schatzki ring not manipulated.  Medium sized hiatal hernia.   POLYPECTOMY  02/07/2018   Procedure: POLYPECTOMY;  Surgeon: Shaaron Lamar HERO, MD;  [facility];  Service: Endoscopy;;  colon    POLYPECTOMY  09/14/2020   Procedure: POLYPECTOMY;  Surgeon: Shaaron Lamar HERO, MD;  [facility];  Service: Endoscopy;;   POLYPECTOMY  09/05/2023   Procedure: POLYPECTOMY;  Surgeon: Shaaron Lamar HERO, MD;  [facility];  Service: Endoscopy;;    Family History  Problem Relation Age of Onset   Colon cancer Neg Hx        parents passed over 40 years ago, UNKNOWN if colon cancer      Social History   Socioeconomic History   Marital status: Divorced    Spouse name: Not on file   Number of children: 4   Years of education: Not on file   Highest education level: Not on file  Occupational History   Occupation: Equity    Employer: [employer]  Tobacco Use   Smoking status: Never   Smokeless tobacco: Never  Vaping Use   Vaping status: Never Used  Substance and Sexual Activity   Alcohol  use: No   Drug use: No   Sexual activity: Not Currently    Comment: declined condoms  Other Topics Concern   Not on file  Social History Narrative   Not on file   Social Drivers of Health   Financial Resource Strain: Not on file  Food Insecurity: No Food Insecurity (07/22/2023)   Hunger Vital Sign    Worried About Running Out of Food in the Last Year: Never true  Ran Out of Food in the Last Year: Never true  Transportation Needs: No Transportation Needs (07/22/2023)   PRAPARE - Administrator, Civil Service (Medical): No    Lack of Transportation (Non-Medical): No  Physical Activity: Not on file  Stress: Not on file  Social Connections: Not on file    No Known Allergies   Current Outpatient Medications:    Ascorbic Acid (VITAMIN C) 1000 MG tablet, Take 2,000 mg by mouth daily., Disp: , Rfl:    Cholecalciferol (D3 5000) 125 MCG (5000 UT) capsule, Take 5,000 Units by mouth daily., Disp: , Rfl:    ezetimibe  (ZETIA ) 10 MG tablet, Take 10 mg by mouth daily., Disp: , Rfl:    [name]  150-150-200-10 MG TABS tablet, TAKE 1  TABLET BY MOUTH DAILY, Disp: 30 tablet, Rfl: 11   pantoprazole  (PROTONIX ) 40 MG tablet, Take 1 tablet (40 mg total) by mouth daily before breakfast., Disp: 90 tablet, Rfl: 3   rosuvastatin  (CRESTOR ) 10 MG tablet, Take 1 tablet (10 mg total) by mouth daily., Disp: 30 tablet, Rfl: 11   Zinc 50 MG TABS, Take 50 mg by mouth daily., Disp: , Rfl:    Review of Systems  Constitutional:  Negative for activity change, appetite change, chills, diaphoresis, fatigue, fever and unexpected weight change.  HENT:  Negative for congestion, rhinorrhea, sinus pressure, sneezing, sore throat and trouble swallowing.   Eyes:  Negative for photophobia and visual disturbance.  Respiratory:  Negative for cough, chest tightness, shortness of breath, wheezing and stridor.   Cardiovascular:  Negative for chest pain, palpitations and leg swelling.  Gastrointestinal:  Negative for abdominal distention, abdominal pain, anal bleeding, blood in stool, constipation, diarrhea, nausea and vomiting.  Genitourinary:  Negative for difficulty urinating, dysuria, flank pain and hematuria.  Musculoskeletal:  Negative for arthralgias, back pain, gait problem, joint swelling and myalgias.  Skin:  Negative for color change, pallor, rash and wound.  Neurological:  Negative for dizziness, tremors, weakness, light-headedness and headaches.  Hematological:  Negative for adenopathy. Does not bruise/bleed easily.  Psychiatric/Behavioral:  Negative for agitation, behavioral problems, confusion, decreased concentration, dysphoric mood, sleep disturbance and suicidal ideas.        Objective:   Physical Exam Constitutional:      Appearance: He is well-developed.  HENT:     Head: Normocephalic and atraumatic.  Eyes:     Conjunctiva/sclera: Conjunctivae normal.  Cardiovascular:     Rate and Rhythm: Normal rate and regular rhythm.  Pulmonary:     Effort: Pulmonary effort is normal. No respiratory distress.     Breath sounds: No wheezing.   Abdominal:     General: There is no distension.     Palpations: Abdomen is soft.  Musculoskeletal:        General: No tenderness. Normal range of motion.     Cervical back: Normal range of motion and neck supple.  Skin:    General: Skin is warm and dry.     Coloration: Skin is not pale.     Findings: No erythema or rash.  Neurological:     General: No focal deficit present.     Mental Status: He is alert and oriented to person, place, and time.  Psychiatric:        Mood and Affect: Mood normal.        Behavior: Behavior normal.        Thought Content: Thought content normal.        Judgment: Judgment normal.  Assessment & Plan:   Assessment and Plan    Human immunodeficiency virus (HIV) infection HIV well-managed with [name] . Considering switch to Biktarvy  for fewer interactions and similar efficacy. Discussed Dovato as alternative if no hepatitis B. Patient prefers Biktarvy . - Order blood work today. - Switch antiretroviral therapy from [name]  to Biktarvy . - Schedule follow-up in one month for repeat blood work to assess response to Biktarvy .   Hyperlipidemia: continue crestor    DM? had been question of this before so will check an A1c

## 2024-06-23 ENCOUNTER — Other Ambulatory Visit (HOSPITAL_COMMUNITY)
Admission: RE | Admit: 2024-06-23 | Discharge: 2024-06-23 | Disposition: A | Discharge status: Home / Self Care | Source: Ambulatory Visit | Attending: Infectious Disease | Admitting: Infectious Disease

## 2024-06-23 ENCOUNTER — Encounter: Payer: Self-pay | Admitting: Infectious Disease

## 2024-06-23 ENCOUNTER — Ambulatory Visit: Payer: 59 | Admitting: Internal Medicine

## 2024-06-23 ENCOUNTER — Ambulatory Visit (INDEPENDENT_AMBULATORY_CARE_PROVIDER_SITE_OTHER): Admitting: Infectious Disease

## 2024-06-23 ENCOUNTER — Other Ambulatory Visit: Payer: Self-pay

## 2024-06-23 VITALS — BP 147/82 | HR 78 | Temp 97.7°F | Wt 197.0 lb

## 2024-06-23 DIAGNOSIS — B20 Human immunodeficiency virus [HIV] disease: Secondary | ICD-10-CM

## 2024-06-23 DIAGNOSIS — E782 Mixed hyperlipidemia: Secondary | ICD-10-CM

## 2024-06-23 DIAGNOSIS — E1165 Type 2 diabetes mellitus with hyperglycemia: Secondary | ICD-10-CM | POA: Diagnosis not present

## 2024-06-23 MED ORDER — ROSUVASTATIN CALCIUM 10 MG PO TABS: 10.0000 mg | ORAL_TABLET | Freq: Every day | ORAL | 11 refills | Status: AC

## 2024-06-23 MED ORDER — BIKTARVY 50-200-25 MG PO TABS: 1.0000 | ORAL_TABLET | Freq: Every day | ORAL | 11 refills | Status: DC

## 2024-06-24 ENCOUNTER — Ambulatory Visit: Payer: Self-pay

## 2024-06-24 LAB — URINE CYTOLOGY ANCILLARY ONLY
Chlamydia: NEGATIVE
Comment: NEGATIVE
Comment: NORMAL
Neisseria Gonorrhea: NEGATIVE

## 2024-06-24 LAB — T-HELPER CELLS (CD4) COUNT (NOT AT ARMC)
CD4 % Helper T Cell: 29 % — ABNORMAL LOW (ref 33–65)
CD4 T Cell Abs: 781 /uL (ref 400–1790)

## 2024-06-25 LAB — LIPID PANEL
Cholesterol: 129 mg/dL (ref <200)
HDL: 36 mg/dL — ABNORMAL LOW (ref >=40)
LDL Cholesterol (Calc): 71 mg/dL
Non-HDL Cholesterol (Calc): 93 mg/dL (ref <130)
Total CHOL/HDL Ratio: 3.6 (calc) (ref <5.0)
Triglycerides: 135 mg/dL (ref <150)

## 2024-06-25 LAB — COMPLETE METABOLIC PANEL WITHOUT GFR
AG Ratio: 2.1 (calc) (ref 1.0–2.5)
ALT: 18 U/L (ref 9–46)
AST: 16 U/L (ref 10–35)
Albumin: 5 g/dL (ref 3.6–5.1)
Alkaline phosphatase (APISO): 85 U/L (ref 35–144)
BUN: 12 mg/dL (ref 7–25)
CO2: 28 mmol/L (ref 20–32)
Calcium: 10 mg/dL (ref 8.6–10.3)
Chloride: 101 mmol/L (ref 98–110)
Creat: 0.97 mg/dL (ref 0.70–1.35)
Globulin: 2.4 g/dL (ref 1.9–3.7)
Glucose, Bld: 128 mg/dL — ABNORMAL HIGH (ref 65–99)
Potassium: 4.6 mmol/L (ref 3.5–5.3)
Sodium: 139 mmol/L (ref 135–146)
Total Bilirubin: 0.4 mg/dL (ref 0.2–1.2)
Total Protein: 7.4 g/dL (ref 6.1–8.1)

## 2024-06-25 LAB — CBC WITH DIFFERENTIAL/PLATELET
Absolute Lymphocytes: 2822 {cells}/uL (ref 850–3900)
Absolute Monocytes: 497 {cells}/uL (ref 200–950)
Basophils Absolute: 50 {cells}/uL (ref 0–200)
Basophils Relative: 0.7 %
Eosinophils Absolute: 396 {cells}/uL (ref 15–500)
Eosinophils Relative: 5.5 %
HCT: 44.3 % (ref 38.5–50.0)
Hemoglobin: 14.2 g/dL (ref 13.2–17.1)
MCH: 27.2 pg (ref 27.0–33.0)
MCHC: 32.1 g/dL (ref 32.0–36.0)
MCV: 84.7 fL (ref 80.0–100.0)
MPV: 9.5 fL (ref 7.5–12.5)
Monocytes Relative: 6.9 %
Neutro Abs: 3434 {cells}/uL (ref 1500–7800)
Neutrophils Relative %: 47.7 %
Platelets: 299 Thousand/uL (ref 140–400)
RBC: 5.23 Million/uL (ref 4.20–5.80)
RDW: 12.4 % (ref 11.0–15.0)
Total Lymphocyte: 39.2 %
WBC: 7.2 Thousand/uL (ref 3.8–10.8)

## 2024-06-25 LAB — HEMOGLOBIN A1C
Hgb A1c MFr Bld: 6.7 % — ABNORMAL HIGH (ref <5.7)
Mean Plasma Glucose: 146 mg/dL
eAG (mmol/L): 8.1 mmol/L

## 2024-06-25 LAB — RPR: RPR Ser Ql: NONREACTIVE

## 2024-06-25 LAB — HIV-1 RNA QUANT-NO REFLEX-BLD
HIV 1 RNA Quant: NOT DETECTED {copies}/mL
HIV-1 RNA Quant, Log: NOT DETECTED {Log_copies}/mL

## 2024-07-21 DIAGNOSIS — R030 Elevated blood-pressure reading, without diagnosis of hypertension: Secondary | ICD-10-CM | POA: Diagnosis not present

## 2024-07-21 DIAGNOSIS — R059 Cough, unspecified: Secondary | ICD-10-CM | POA: Diagnosis not present

## 2024-07-28 ENCOUNTER — Ambulatory Visit: Payer: Self-pay | Admitting: Infectious Disease

## 2024-08-08 NOTE — Progress Notes (Unsigned)
 Subjective:  Chief complaint: follow-up for HIV disease on medications   Patient ID: Luis Gomez, male    DOB: [birth year], 68 y.o.   MRN: [id number]  HPI  Past Medical History:  Diagnosis Date   Diverticulosis    Genital HSV    GERD (gastroesophageal reflux disease)    Last EGD->07/02/06 normal   HIV disease (HCC)    S/P colonoscopy 07/02/2006   Left sided diverticula    Past Surgical History:  Procedure Laterality Date   BACK SURGERY     COLONOSCOPY  07/03/2006   Normal rectum, left-sided diverticulum   COLONOSCOPY N/A 02/07/2018   Rourk: Sessile serrated polyp removed from the colon, tubular adenoma.  Next colonoscopy in 5 years.   COLONOSCOPY N/A 09/14/2020   Diverticulosis, 2 polyps removed (sessile serrated polyp, tubular adenoma), next colonoscopy in 5 years.   COLONOSCOPY WITH PROPOFOL  N/A 09/05/2023   Procedure: COLONOSCOPY WITH PROPOFOL ;  Surgeon: Shaaron Lamar HERO, MD;  Location: [facility];  Service: Endoscopy;  Laterality: N/A;  11:00 am, asa 2   ESOPHAGOGASTRODUODENOSCOPY  07/03/2006   Normal esophagus, stomach, D1, D2.   ESOPHAGOGASTRODUODENOSCOPY N/A 04/01/2020   Rourk: Severe erosive/ulcerative reflux esophagitis extending up 8 cm from the GE junction.  Noncritical Schatzki ring not manipulated.  Medium sized hiatal hernia.   POLYPECTOMY  02/07/2018   Procedure: POLYPECTOMY;  Surgeon: Shaaron Lamar HERO, MD;  Location: [facility];  Service: Endoscopy;;  colon    POLYPECTOMY  09/14/2020   Procedure: POLYPECTOMY;  Surgeon: Shaaron Lamar HERO, MD;  Location: [facility];  Service: Endoscopy;;   POLYPECTOMY  09/05/2023   Procedure: POLYPECTOMY;  Surgeon: Shaaron Lamar HERO, MD;  Location: [facility];  Service: Endoscopy;;    Family History  Problem Relation Age of Onset   Colon cancer Neg Hx        parents passed over 40 years ago, UNKNOWN if colon cancer      Social History   Socioeconomic History   Marital status: Divorced    Spouse name: Not on  file   Number of children: 4   Years of education: Not on file   Highest education level: Not on file  Occupational History   Occupation: Equity    Employer: [employer]  Tobacco Use   Smoking status: Never   Smokeless tobacco: Never  Vaping Use   Vaping status: Never Used  Substance and Sexual Activity   Alcohol  use: No   Drug use: No   Sexual activity: Not Currently    Comment: declined condoms  Other Topics Concern   Not on file  Social History Narrative   Not on file   Social Drivers of Health   Financial Resource Strain: Not on file  Food Insecurity: No Food Insecurity (07/22/2023)   Hunger Vital Sign    Worried About Running Out of Food in the Last Year: Never true    Ran Out of Food in the Last Year: Never true  Transportation Needs: No Transportation Needs (07/22/2023)   PRAPARE - Administrator, Civil Service (Medical): No    Lack of Transportation (Non-Medical): No  Physical Activity: Not on file  Stress: Not on file  Social Connections: Not on file    No Known Allergies   Current Outpatient Medications:    Ascorbic Acid (VITAMIN C) 1000 MG tablet, Take 2,000 mg by mouth daily., Disp: , Rfl:    bictegravir-emtricitabine-tenofovir  AF (BIKTARVY ) 50-200-25 MG TABS tablet, Take 1 tablet  by mouth daily., Disp: 30 tablet, Rfl: 11   Cholecalciferol (D3 5000) 125 MCG (5000 UT) capsule, Take 5,000 Units by mouth daily., Disp: , Rfl:    ezetimibe  (ZETIA ) 10 MG tablet, Take 10 mg by mouth daily., Disp: , Rfl:    pantoprazole  (PROTONIX ) 40 MG tablet, Take 1 tablet (40 mg total) by mouth daily before breakfast., Disp: 90 tablet, Rfl: 3   rosuvastatin  (CRESTOR ) 10 MG tablet, Take 1 tablet (10 mg total) by mouth daily., Disp: 30 tablet, Rfl: 11   Zinc 50 MG TABS, Take 50 mg by mouth daily., Disp: , Rfl:     Review of Systems     Objective:   Physical Exam        Assessment & Plan:

## 2024-08-10 ENCOUNTER — Ambulatory Visit (INDEPENDENT_AMBULATORY_CARE_PROVIDER_SITE_OTHER): Admitting: Infectious Disease

## 2024-08-10 ENCOUNTER — Encounter: Payer: Self-pay | Admitting: Infectious Disease

## 2024-08-10 ENCOUNTER — Other Ambulatory Visit: Payer: Self-pay

## 2024-08-10 VITALS — BP 150/83 | HR 112 | Temp 97.9°F | Resp 16 | Wt 195.0 lb

## 2024-08-10 DIAGNOSIS — Z23 Encounter for immunization: Secondary | ICD-10-CM | POA: Diagnosis not present

## 2024-08-10 DIAGNOSIS — Z7185 Encounter for immunization safety counseling: Secondary | ICD-10-CM

## 2024-08-10 DIAGNOSIS — E782 Mixed hyperlipidemia: Secondary | ICD-10-CM

## 2024-08-10 DIAGNOSIS — B20 Human immunodeficiency virus [HIV] disease: Secondary | ICD-10-CM

## 2024-08-10 DIAGNOSIS — Z79899 Other long term (current) drug therapy: Secondary | ICD-10-CM

## 2024-08-10 MED ORDER — BIKTARVY 50-200-25 MG PO TABS: 1.0000 | ORAL_TABLET | Freq: Every day | ORAL | 11 refills | Status: AC

## 2024-08-11 LAB — T-HELPER CELLS (CD4) COUNT (NOT AT ARMC)
CD4 % Helper T Cell: 27 % — ABNORMAL LOW (ref 33–65)
CD4 T Cell Abs: 726 /uL (ref 400–1790)

## 2024-08-12 LAB — HIV-1 RNA QUANT-NO REFLEX-BLD
HIV 1 RNA Quant: <20 {copies}/mL — AB
HIV-1 RNA Quant, Log: <1.3 {Log_copies}/mL — AB

## 2024-10-15 ENCOUNTER — Encounter: Admitting: Nutrition

## 2024-10-15 VITALS — Ht 66.0 in | Wt 198.0 lb

## 2024-10-15 DIAGNOSIS — E1165 Type 2 diabetes mellitus with hyperglycemia: Secondary | ICD-10-CM | POA: Diagnosis present

## 2024-10-15 NOTE — Progress Notes (Signed)
 Medical Nutrition Therapy  Appointment Start time:  0900  Appointment End time:  1030  Primary concerns today: Type 2 DM  Referral diagnosis: E11.8 Preferred learning style: no preference  (auditory, visual, hands on, no preference indicated) Learning readiness: Ready; change in process   NUTRITION ASSESSMENT  68 yr old hispanic male here with his male friend and an spanish interpreter. PCP Silvio Ramp, [facility]. A1C 6.7%.  Take Meformin 500 mg BID, Recently has changed his diet and is eating much healthier. Has cut out sodas, sweets and processed foods. Doesn't drink alcohol. He has been testing twice a day; am 100-120's, bedtime 110-140's mg/dl. Feels good. Has constipation at times. Takes a fiber supplement and driinks prune juice or eats prunes. Eating more fruits, vegetables and beans. Has cut back on tortillas. Does eat mexican cheese.  He usually skips breakfast. Does do some oatmeal smoothies at times. He is willing to work on more whole plant based diet and exercise to improve his DM.  Clinical Medications Ordered Prior to Encounter[1]  Medical Hx:  Past Medical History:  Diagnosis Date   Diverticulosis    Genital HSV    GERD (gastroesophageal reflux disease)    Last EGD->07/02/06 normal   HIV disease (HCC)    S/P colonoscopy 07/02/2006   Left sided diverticula  Type 2 DM.  Medications: Medications Ordered Prior to Encounter[2] Metformin 500 mg twice a day. See list.  Labs:  Lab Results  Component Value Date   HGBA1C 6.7 (H) 06/23/2024      Latest Ref Rng & Units 06/23/2024   10:52 AM 07/27/2023   12:23 PM 07/24/2023    4:44 AM  CMP  Glucose 65 - 99 mg/dL 871  862  833   BUN 7 - 25 mg/dL 12  8  9    Creatinine 0.70 - 1.35 mg/dL 9.02  9.14  9.13   Sodium 135 - 146 mmol/L 139  136  131   Potassium 3.5 - 5.3 mmol/L 4.6  4.0  3.3   Chloride 98 - 110 mmol/L 101  99  99   CO2 20 - 32 mmol/L 28  26  23    Calcium  8.6 - 10.3 mg/dL 89.9  9.3  7.8   Total  Protein 6.1 - 8.1 g/dL 7.4   6.1   Total Bilirubin 0.2 - 1.2 mg/dL 0.4   0.9   Alkaline Phos 38 - 126 U/L   57   AST 10 - 35 U/L 16   12   ALT 9 - 46 U/L 18   16     Notable Signs/Symptoms: sweats sometimes and gets dizzy.  Lifestyle & Dietary Hx LIves with a male friend. He cooks some.  Estimated daily fluid intake: 80 oz Supplements: MVI VIT C, Omega 3 Fish oil Sleep: good Stress / self-care:  Current average weekly physical activity: walks some  24-Hr Dietary Recall First Meal: Pork chop and cactus Snack:  Second Meal: Meat and vegetables, water  Snack:  Third Meal: Meat and vegetables, water  Snack:  Beverages: water   Estimated Energy Needs Calories: 1800 Carbohydrate: 200g Protein: 135g Fat: 50g   NUTRITION DIAGNOSIS  NB-1.1 Food and nutrition-related knowledge deficit As related to Newly diagnosed Type 2 DM .  As evidenced by A1C 6.7%.   NUTRITION INTERVENTION  Nutrition education (E-1) on the following topics:  Nutrition and Diabetes education provided on My Plate, CHO counting, meal planning, portion sizes, timing of meals, avoiding snacks between meals unless having a low blood  sugar, target ranges for A1C and blood sugars, signs/symptoms and treatment of hyper/hypoglycemia, monitoring blood sugars, taking medications as prescribed, benefits of exercising 30 minutes per day and prevention of complications of DM.  Lifestyle Medicine  - Whole Food, Plant Predominant Nutrition is highly recommended: Eat Plenty of vegetables, Mushrooms, fruits, Legumes, Whole Grains, Nuts, seeds in lieu of processed meats, processed snacks/pastries red meat, poultry, eggs.    -It is better to avoid simple carbohydrates including: Cakes, Sweet Desserts, Ice Cream, Soda (diet and regular), Sweet Tea, Candies, Chips, Cookies, Store Bought Juices, Alcohol in Excess of  1-2 drinks a day, Lemonade,  Artificial Sweeteners, Doughnuts, Coffee Creamers, Sugar-free Products, etc, etc.  This  is not a complete list.....  Exercise: If you are able: 30 -60 minutes a day ,4 days a week, or 150 minutes a week.  The longer the better.  Combine stretch, strength, and aerobic activities.  If you were told in the past that you have high risk for cardiovascular diseases, you may seek evaluation by your heart doctor prior to initiating moderate to intense exercise programs.   Handouts Provided Include  Know your numbers Healthy Eating and DM CVD and DM Carb counting Meal Plan Card My Plate S/S of hyper/hypoglycemia  Learning Style & Readiness for Change Teaching method utilized: Visual & Auditory  Demonstrated degree of understanding via: Teach Back  Barriers to learning/adherence to lifestyle change: none  Goals Established by Pt Eat three balanced meals per day as discussed Eat more whole plant based foods. Cut back on Rice and beans and increase vegetables Drink only water   Exercise 30 minutes a day Get A1C down to 5.7% Limit tortillas to 1-2 per meal.    MONITORING & EVALUATION Dietary intake, weekly physical activity, and blood sugars in 1 month.  Next Steps  Patient is to work on eating more whole plant based foods and exercise..     [1]  Current Outpatient Medications on File Prior to Visit  Medication Sig Dispense Refill   Ascorbic Acid (VITAMIN C) 1000 MG tablet Take 2,000 mg by mouth daily.     bictegravir-emtricitabine-tenofovir  AF (BIKTARVY ) 50-200-25 MG TABS tablet Take 1 tablet by mouth daily. 30 tablet 11   Cholecalciferol (D3 5000) 125 MCG (5000 UT) capsule Take 5,000 Units by mouth daily.     ezetimibe  (ZETIA ) 10 MG tablet Take 10 mg by mouth daily.     folic acid -vitamin b complex-vitamin c-selenium-zinc (DIALYVITE) 3 MG TABS tablet Take 1 tablet by mouth daily.     metFORMIN (GLUCOPHAGE) 500 MG tablet Take by mouth 2 (two) times daily with a meal.     omega-3 acid ethyl esters (LOVAZA) 1 g capsule Take by mouth 2 (two) times daily.     pantoprazole   (PROTONIX ) 40 MG tablet Take 1 tablet (40 mg total) by mouth daily before breakfast. 90 tablet 3   rosuvastatin  (CRESTOR ) 10 MG tablet Take 1 tablet (10 mg total) by mouth daily. 30 tablet 11   Zinc 50 MG TABS Take 50 mg by mouth daily.     No current facility-administered medications on file prior to visit.  [2]  Current Outpatient Medications on File Prior to Visit  Medication Sig Dispense Refill   Ascorbic Acid (VITAMIN C) 1000 MG tablet Take 2,000 mg by mouth daily.     bictegravir-emtricitabine-tenofovir  AF (BIKTARVY ) 50-200-25 MG TABS tablet Take 1 tablet by mouth daily. 30 tablet 11   Cholecalciferol (D3 5000) 125 MCG (5000 UT) capsule Take 5,000 Units  by mouth daily.     ezetimibe  (ZETIA ) 10 MG tablet Take 10 mg by mouth daily.     folic acid -vitamin b complex-vitamin c-selenium-zinc (DIALYVITE) 3 MG TABS tablet Take 1 tablet by mouth daily.     metFORMIN (GLUCOPHAGE) 500 MG tablet Take by mouth 2 (two) times daily with a meal.     omega-3 acid ethyl esters (LOVAZA) 1 g capsule Take by mouth 2 (two) times daily.     pantoprazole  (PROTONIX ) 40 MG tablet Take 1 tablet (40 mg total) by mouth daily before breakfast. 90 tablet 3   rosuvastatin  (CRESTOR ) 10 MG tablet Take 1 tablet (10 mg total) by mouth daily. 30 tablet 11   Zinc 50 MG TABS Take 50 mg by mouth daily.     No current facility-administered medications on file prior to visit.

## 2024-10-22 ENCOUNTER — Encounter: Payer: Self-pay | Admitting: Nutrition

## 2024-10-22 NOTE — Patient Instructions (Signed)
 Goals Established by Pt Eat three balanced meals per day as discussed Eat more whole plant based foods. Cut back on Rice and beans and increase vegetables Drink only water   Exercise 30 minutes a day Get A1C down to 5.7% Limit tortillas to 1-2 per meal.

## 2024-11-05 ENCOUNTER — Encounter: Payer: Self-pay | Admitting: Gastroenterology

## 2024-12-17 ENCOUNTER — Encounter: Admitting: Nutrition

## 2025-06-07 ENCOUNTER — Other Ambulatory Visit

## 2025-06-21 ENCOUNTER — Encounter: Payer: Self-pay | Admitting: Infectious Disease
